# Patient Record
Sex: Male | Born: 1945 | Race: White | Hispanic: Yes | Marital: Married | Smoking: Former smoker
Health system: Southern US, Community
[De-identification: ages and names within clinical notes are randomized; demographics above are authoritative.]

## PROBLEM LIST (undated history)

## (undated) DIAGNOSIS — S069XAA Unspecified intracranial injury with loss of consciousness status unknown, initial encounter: Secondary | ICD-10-CM

## (undated) DIAGNOSIS — E871 Hypo-osmolality and hyponatremia: Secondary | ICD-10-CM

## (undated) DIAGNOSIS — D649 Anemia, unspecified: Secondary | ICD-10-CM

## (undated) DIAGNOSIS — I10 Essential (primary) hypertension: Secondary | ICD-10-CM

## (undated) DIAGNOSIS — R05 Cough: Secondary | ICD-10-CM

## (undated) DIAGNOSIS — K5730 Diverticulosis of large intestine without perforation or abscess without bleeding: Secondary | ICD-10-CM

## (undated) DIAGNOSIS — R194 Change in bowel habit: Secondary | ICD-10-CM

## (undated) DIAGNOSIS — F419 Anxiety disorder, unspecified: Secondary | ICD-10-CM

## (undated) DIAGNOSIS — E119 Type 2 diabetes mellitus without complications: Secondary | ICD-10-CM

## (undated) DIAGNOSIS — K819 Cholecystitis, unspecified: Secondary | ICD-10-CM

## (undated) DIAGNOSIS — K219 Gastro-esophageal reflux disease without esophagitis: Secondary | ICD-10-CM

## (undated) DIAGNOSIS — S069X9A Unspecified intracranial injury with loss of consciousness of unspecified duration, initial encounter: Secondary | ICD-10-CM

## (undated) HISTORY — DX: Gastro-esophageal reflux disease without esophagitis: K21.9

## (undated) HISTORY — DX: Diverticulosis of large intestine without perforation or abscess without bleeding: K57.30

## (undated) HISTORY — DX: Anemia, unspecified: D64.9

## (undated) HISTORY — PX: LAPAROTOMY: SHX154

## (undated) HISTORY — DX: Change in bowel habit: R19.4

## (undated) HISTORY — DX: Cough: R05

## (undated) HISTORY — DX: Cholecystitis, unspecified: K81.9

## (undated) HISTORY — PX: PARTIAL GASTRECTOMY: SHX2172

## (undated) HISTORY — PX: CHOLECYSTECTOMY: SHX55

## (undated) HISTORY — PX: BACK SURGERY: SHX140

---

## 1998-01-26 ENCOUNTER — Emergency Department (HOSPITAL_COMMUNITY): Admission: EM | Admit: 1998-01-26 | Discharge: 1998-01-26 | Payer: Self-pay | Admitting: Emergency Medicine

## 2001-01-03 ENCOUNTER — Inpatient Hospital Stay (HOSPITAL_COMMUNITY): Admission: EM | Admit: 2001-01-03 | Discharge: 2001-01-06 | Payer: Self-pay | Admitting: Emergency Medicine

## 2001-01-03 ENCOUNTER — Encounter (INDEPENDENT_AMBULATORY_CARE_PROVIDER_SITE_OTHER): Payer: Self-pay | Admitting: Specialist

## 2001-01-03 ENCOUNTER — Encounter: Payer: Self-pay | Admitting: Emergency Medicine

## 2001-01-24 ENCOUNTER — Ambulatory Visit (HOSPITAL_COMMUNITY): Admission: RE | Admit: 2001-01-24 | Discharge: 2001-01-24 | Payer: Self-pay

## 2001-01-27 ENCOUNTER — Ambulatory Visit (HOSPITAL_COMMUNITY): Admission: RE | Admit: 2001-01-27 | Discharge: 2001-01-27 | Payer: Self-pay | Admitting: Family Medicine

## 2001-03-24 ENCOUNTER — Ambulatory Visit (HOSPITAL_COMMUNITY): Admission: RE | Admit: 2001-03-24 | Discharge: 2001-03-24 | Payer: Self-pay | Admitting: Gastroenterology

## 2001-03-24 ENCOUNTER — Encounter (INDEPENDENT_AMBULATORY_CARE_PROVIDER_SITE_OTHER): Payer: Self-pay | Admitting: *Deleted

## 2001-06-26 ENCOUNTER — Ambulatory Visit (HOSPITAL_COMMUNITY): Admission: RE | Admit: 2001-06-26 | Discharge: 2001-06-26 | Payer: Self-pay | Admitting: Family Medicine

## 2001-10-01 ENCOUNTER — Inpatient Hospital Stay (HOSPITAL_COMMUNITY): Admission: EM | Admit: 2001-10-01 | Discharge: 2001-10-06 | Payer: Self-pay | Admitting: Emergency Medicine

## 2001-10-03 ENCOUNTER — Encounter (INDEPENDENT_AMBULATORY_CARE_PROVIDER_SITE_OTHER): Payer: Self-pay | Admitting: *Deleted

## 2001-10-06 ENCOUNTER — Encounter: Payer: Self-pay | Admitting: Internal Medicine

## 2001-11-12 ENCOUNTER — Emergency Department (HOSPITAL_COMMUNITY): Admission: EM | Admit: 2001-11-12 | Discharge: 2001-11-12 | Payer: Self-pay | Admitting: Emergency Medicine

## 2001-11-12 ENCOUNTER — Encounter: Payer: Self-pay | Admitting: Emergency Medicine

## 2002-02-22 ENCOUNTER — Emergency Department (HOSPITAL_COMMUNITY): Admission: EM | Admit: 2002-02-22 | Discharge: 2002-02-22 | Payer: Self-pay | Admitting: Emergency Medicine

## 2002-02-22 ENCOUNTER — Encounter: Payer: Self-pay | Admitting: Emergency Medicine

## 2002-06-18 ENCOUNTER — Ambulatory Visit (HOSPITAL_COMMUNITY): Admission: RE | Admit: 2002-06-18 | Discharge: 2002-06-20 | Payer: Self-pay | Admitting: Orthopedic Surgery

## 2002-06-18 ENCOUNTER — Encounter: Payer: Self-pay | Admitting: Orthopedic Surgery

## 2002-12-30 ENCOUNTER — Ambulatory Visit (HOSPITAL_BASED_OUTPATIENT_CLINIC_OR_DEPARTMENT_OTHER): Admission: RE | Admit: 2002-12-30 | Discharge: 2002-12-31 | Payer: Self-pay | Admitting: *Deleted

## 2003-01-12 ENCOUNTER — Encounter: Payer: Self-pay | Admitting: Specialist

## 2003-01-12 ENCOUNTER — Encounter: Admission: RE | Admit: 2003-01-12 | Discharge: 2003-01-12 | Payer: Self-pay | Admitting: Specialist

## 2003-06-09 ENCOUNTER — Ambulatory Visit (HOSPITAL_BASED_OUTPATIENT_CLINIC_OR_DEPARTMENT_OTHER): Admission: RE | Admit: 2003-06-09 | Discharge: 2003-06-09 | Payer: Self-pay | Admitting: *Deleted

## 2005-01-04 ENCOUNTER — Ambulatory Visit: Payer: Self-pay | Admitting: Nurse Practitioner

## 2005-01-05 ENCOUNTER — Ambulatory Visit (HOSPITAL_COMMUNITY): Admission: RE | Admit: 2005-01-05 | Discharge: 2005-01-05 | Payer: Self-pay | Admitting: Internal Medicine

## 2005-01-08 ENCOUNTER — Ambulatory Visit: Payer: Self-pay | Admitting: Nurse Practitioner

## 2005-01-08 ENCOUNTER — Ambulatory Visit: Payer: Self-pay | Admitting: *Deleted

## 2005-01-10 ENCOUNTER — Encounter (HOSPITAL_COMMUNITY): Admission: RE | Admit: 2005-01-10 | Discharge: 2005-04-10 | Payer: Self-pay | Admitting: Internal Medicine

## 2005-01-16 ENCOUNTER — Ambulatory Visit (HOSPITAL_COMMUNITY): Admission: RE | Admit: 2005-01-16 | Discharge: 2005-01-16 | Payer: Self-pay | Admitting: Internal Medicine

## 2005-01-31 ENCOUNTER — Ambulatory Visit (HOSPITAL_COMMUNITY): Admission: RE | Admit: 2005-01-31 | Discharge: 2005-01-31 | Payer: Self-pay | Admitting: Internal Medicine

## 2005-02-01 ENCOUNTER — Ambulatory Visit: Payer: Self-pay | Admitting: Nurse Practitioner

## 2005-02-08 ENCOUNTER — Ambulatory Visit: Payer: Self-pay | Admitting: Nurse Practitioner

## 2005-03-15 ENCOUNTER — Ambulatory Visit: Payer: Self-pay | Admitting: Nurse Practitioner

## 2005-04-09 ENCOUNTER — Ambulatory Visit: Payer: Self-pay | Admitting: Internal Medicine

## 2005-04-17 ENCOUNTER — Ambulatory Visit: Payer: Self-pay | Admitting: Nurse Practitioner

## 2005-04-24 ENCOUNTER — Ambulatory Visit: Payer: Self-pay | Admitting: Nurse Practitioner

## 2005-05-14 ENCOUNTER — Ambulatory Visit: Payer: Self-pay | Admitting: Pulmonary Disease

## 2005-05-17 ENCOUNTER — Ambulatory Visit: Payer: Self-pay | Admitting: Nurse Practitioner

## 2005-06-16 ENCOUNTER — Emergency Department (HOSPITAL_COMMUNITY): Admission: EM | Admit: 2005-06-16 | Discharge: 2005-06-16 | Payer: Self-pay | Admitting: Emergency Medicine

## 2005-06-18 ENCOUNTER — Ambulatory Visit: Payer: Self-pay | Admitting: Nurse Practitioner

## 2005-07-23 ENCOUNTER — Ambulatory Visit: Payer: Self-pay | Admitting: Internal Medicine

## 2005-08-10 ENCOUNTER — Ambulatory Visit: Payer: Self-pay | Admitting: Pulmonary Disease

## 2005-10-16 ENCOUNTER — Ambulatory Visit: Payer: Self-pay | Admitting: Nurse Practitioner

## 2005-11-05 ENCOUNTER — Ambulatory Visit: Payer: Self-pay | Admitting: Nurse Practitioner

## 2005-11-07 ENCOUNTER — Ambulatory Visit: Payer: Self-pay | Admitting: Pulmonary Disease

## 2005-11-19 ENCOUNTER — Ambulatory Visit: Payer: Self-pay | Admitting: Nurse Practitioner

## 2005-12-18 ENCOUNTER — Ambulatory Visit: Payer: Self-pay | Admitting: Internal Medicine

## 2006-01-22 ENCOUNTER — Ambulatory Visit: Payer: Self-pay | Admitting: Nurse Practitioner

## 2006-02-28 ENCOUNTER — Ambulatory Visit: Payer: Self-pay | Admitting: Family Medicine

## 2006-03-21 ENCOUNTER — Ambulatory Visit: Payer: Self-pay | Admitting: Nurse Practitioner

## 2006-03-22 ENCOUNTER — Ambulatory Visit (HOSPITAL_COMMUNITY): Admission: RE | Admit: 2006-03-22 | Discharge: 2006-03-22 | Payer: Self-pay | Admitting: Family Medicine

## 2007-03-13 ENCOUNTER — Ambulatory Visit: Payer: Self-pay | Admitting: Internal Medicine

## 2007-03-17 ENCOUNTER — Ambulatory Visit (HOSPITAL_COMMUNITY): Admission: RE | Admit: 2007-03-17 | Discharge: 2007-03-17 | Payer: Self-pay | Admitting: Nurse Practitioner

## 2007-04-09 ENCOUNTER — Ambulatory Visit: Payer: Self-pay | Admitting: Family Medicine

## 2007-05-07 ENCOUNTER — Encounter (INDEPENDENT_AMBULATORY_CARE_PROVIDER_SITE_OTHER): Payer: Self-pay | Admitting: *Deleted

## 2007-05-26 ENCOUNTER — Ambulatory Visit: Payer: Self-pay | Admitting: Internal Medicine

## 2007-05-30 ENCOUNTER — Encounter: Admission: RE | Admit: 2007-05-30 | Discharge: 2007-07-02 | Payer: Self-pay | Admitting: Internal Medicine

## 2007-11-22 ENCOUNTER — Emergency Department (HOSPITAL_COMMUNITY): Admission: EM | Admit: 2007-11-22 | Discharge: 2007-11-22 | Payer: Self-pay | Admitting: Emergency Medicine

## 2008-06-01 ENCOUNTER — Ambulatory Visit (HOSPITAL_COMMUNITY): Admission: RE | Admit: 2008-06-01 | Discharge: 2008-06-01 | Payer: Self-pay | Admitting: Internal Medicine

## 2008-06-01 ENCOUNTER — Ambulatory Visit: Payer: Self-pay | Admitting: Internal Medicine

## 2008-06-01 LAB — CONVERTED CEMR LAB
ALT: 12 units/L (ref 0–53)
AST: 20 units/L (ref 0–37)
Albumin: 4.2 g/dL (ref 3.5–5.2)
CO2: 23 meq/L (ref 19–32)
Calcium: 8.7 mg/dL (ref 8.4–10.5)
Chloride: 104 meq/L (ref 96–112)
Creatinine, Ser: 0.86 mg/dL (ref 0.40–1.50)
Eosinophils Relative: 3 % (ref 0–5)
HCT: 30.8 % — ABNORMAL LOW (ref 39.0–52.0)
Hemoglobin: 8.7 g/dL — ABNORMAL LOW (ref 13.0–17.0)
Lipase: 22 units/L (ref 0–75)
Lymphs Abs: 1.4 10*3/uL (ref 0.7–4.0)
MCV: 67.7 fL — ABNORMAL LOW (ref 78.0–100.0)
Potassium: 4.7 meq/L (ref 3.5–5.3)
RDW: 19 % — ABNORMAL HIGH (ref 11.5–15.5)
Total Bilirubin: 0.3 mg/dL (ref 0.3–1.2)
Total Protein: 7.7 g/dL (ref 6.0–8.3)

## 2008-07-12 ENCOUNTER — Ambulatory Visit: Payer: Self-pay | Admitting: Internal Medicine

## 2008-07-12 ENCOUNTER — Encounter (INDEPENDENT_AMBULATORY_CARE_PROVIDER_SITE_OTHER): Payer: Self-pay | Admitting: Internal Medicine

## 2008-07-12 LAB — CONVERTED CEMR LAB
Basophils Relative: 0 % (ref 0–1)
Lymphocytes Relative: 25 % (ref 12–46)
Lymphs Abs: 1.6 10*3/uL (ref 0.7–4.0)
MCHC: 29.6 g/dL — ABNORMAL LOW (ref 30.0–36.0)
MCV: 83.3 fL (ref 78.0–100.0)
Monocytes Absolute: 0.7 10*3/uL (ref 0.1–1.0)
RBC: 5.15 M/uL (ref 4.22–5.81)
WBC: 6.6 10*3/uL (ref 4.0–10.5)

## 2008-07-19 ENCOUNTER — Ambulatory Visit: Payer: Self-pay | Admitting: Internal Medicine

## 2008-12-17 ENCOUNTER — Ambulatory Visit: Payer: Self-pay | Admitting: Internal Medicine

## 2009-01-14 ENCOUNTER — Ambulatory Visit: Payer: Self-pay | Admitting: Internal Medicine

## 2009-02-16 ENCOUNTER — Ambulatory Visit: Payer: Self-pay | Admitting: Internal Medicine

## 2009-03-02 ENCOUNTER — Ambulatory Visit: Payer: Self-pay | Admitting: Family Medicine

## 2009-03-07 ENCOUNTER — Ambulatory Visit (HOSPITAL_COMMUNITY): Admission: EM | Admit: 2009-03-07 | Discharge: 2009-03-07 | Payer: Self-pay | Admitting: Emergency Medicine

## 2009-03-16 ENCOUNTER — Ambulatory Visit: Payer: Self-pay | Admitting: Internal Medicine

## 2009-05-10 ENCOUNTER — Ambulatory Visit: Payer: Self-pay | Admitting: Internal Medicine

## 2009-05-10 ENCOUNTER — Encounter (INDEPENDENT_AMBULATORY_CARE_PROVIDER_SITE_OTHER): Payer: Self-pay | Admitting: Internal Medicine

## 2009-05-10 LAB — CONVERTED CEMR LAB
Calcium: 8.9 mg/dL (ref 8.4–10.5)
Cholesterol: 185 mg/dL (ref 0–200)
HDL: 48 mg/dL (ref >39)
LDL Cholesterol: 120 mg/dL — ABNORMAL HIGH (ref 0–99)
Total CHOL/HDL Ratio: 3.9
Triglycerides: 84 mg/dL (ref <150)
VLDL: 17 mg/dL (ref 0–40)

## 2009-05-25 ENCOUNTER — Ambulatory Visit: Payer: Self-pay | Admitting: Internal Medicine

## 2009-06-01 ENCOUNTER — Encounter: Payer: Self-pay | Admitting: Family Medicine

## 2009-06-01 ENCOUNTER — Ambulatory Visit: Payer: Self-pay | Admitting: Internal Medicine

## 2009-06-01 LAB — CONVERTED CEMR LAB
Cholesterol: 184 mg/dL (ref 0–200)
HDL: 51 mg/dL (ref >39)
LDL Cholesterol: 117 mg/dL — ABNORMAL HIGH (ref 0–99)
VLDL: 16 mg/dL (ref 0–40)

## 2009-07-11 ENCOUNTER — Ambulatory Visit: Payer: Self-pay | Admitting: Internal Medicine

## 2009-08-10 ENCOUNTER — Ambulatory Visit: Payer: Self-pay | Admitting: Internal Medicine

## 2009-09-14 ENCOUNTER — Ambulatory Visit: Payer: Self-pay | Admitting: Internal Medicine

## 2009-09-19 ENCOUNTER — Ambulatory Visit: Payer: Self-pay | Admitting: Internal Medicine

## 2009-10-12 ENCOUNTER — Ambulatory Visit: Payer: Self-pay | Admitting: Internal Medicine

## 2009-10-12 LAB — CONVERTED CEMR LAB
BUN: 14 mg/dL (ref 6–23)
Calcium: 9.1 mg/dL (ref 8.4–10.5)
Eosinophils Absolute: 0.1 10*3/uL (ref 0.0–0.7)
Lymphocytes Relative: 15 % (ref 12–46)
MCV: 94.6 fL (ref 78.0–100.0)
Monocytes Absolute: 0.6 10*3/uL (ref 0.1–1.0)
Neutro Abs: 5.8 10*3/uL (ref 1.7–7.7)
Platelets: 192 10*3/uL (ref 150–400)
RDW: 13.8 % (ref 11.5–15.5)
Sodium: 140 meq/L (ref 135–145)

## 2009-10-17 ENCOUNTER — Ambulatory Visit: Payer: Self-pay | Admitting: Internal Medicine

## 2009-11-09 ENCOUNTER — Ambulatory Visit: Payer: Self-pay | Admitting: Internal Medicine

## 2009-12-05 ENCOUNTER — Ambulatory Visit: Payer: Self-pay | Admitting: Internal Medicine

## 2009-12-05 LAB — CONVERTED CEMR LAB
BUN: 17 mg/dL (ref 6–23)
Glucose, Bld: 96 mg/dL (ref 70–99)
Potassium: 4.8 meq/L (ref 3.5–5.3)
Sodium: 142 meq/L (ref 135–145)

## 2009-12-16 ENCOUNTER — Ambulatory Visit: Payer: Self-pay | Admitting: Internal Medicine

## 2010-01-01 ENCOUNTER — Emergency Department (HOSPITAL_COMMUNITY): Admission: EM | Admit: 2010-01-01 | Discharge: 2010-01-01 | Payer: Self-pay | Admitting: Emergency Medicine

## 2010-01-04 ENCOUNTER — Ambulatory Visit: Payer: Self-pay | Admitting: Internal Medicine

## 2010-01-04 LAB — CONVERTED CEMR LAB: Hgb A1c MFr Bld: 6.2 % — ABNORMAL HIGH (ref <5.7)

## 2010-01-09 ENCOUNTER — Ambulatory Visit: Payer: Self-pay | Admitting: Internal Medicine

## 2010-02-15 ENCOUNTER — Ambulatory Visit: Payer: Self-pay | Admitting: Internal Medicine

## 2010-02-15 LAB — CONVERTED CEMR LAB
ALT: 25 units/L (ref 0–53)
Alkaline Phosphatase: 50 units/L (ref 39–117)
Basophils Absolute: 0 10*3/uL (ref 0.0–0.1)
Basophils Relative: 0 % (ref 0–1)
CO2: 24 meq/L (ref 19–32)
Calcium: 9.2 mg/dL (ref 8.4–10.5)
Chloride: 102 meq/L (ref 96–112)
Cholesterol: 183 mg/dL (ref 0–200)
Eosinophils Absolute: 0.2 10*3/uL (ref 0.0–0.7)
Eosinophils Relative: 3 % (ref 0–5)
Glucose, Bld: 98 mg/dL (ref 70–99)
Iron: 105 ug/dL (ref 42–165)
Lymphocytes Relative: 26 % (ref 12–46)
Lymphs Abs: 1.3 10*3/uL (ref 0.7–4.0)
Monocytes Absolute: 0.6 10*3/uL (ref 0.1–1.0)
Monocytes Relative: 12 % (ref 3–12)
Neutro Abs: 3.1 10*3/uL (ref 1.7–7.7)
Neutrophils Relative %: 60 % (ref 43–77)
RBC: 4.14 M/uL — ABNORMAL LOW (ref 4.22–5.81)
Saturation Ratios: 37 % (ref 20–55)
Sodium: 137 meq/L (ref 135–145)
Total CK: 83 units/L (ref 7–232)
Total Protein: 7.2 g/dL (ref 6.0–8.3)
Triglycerides: 63 mg/dL (ref <150)
UIBC: 182 ug/dL

## 2010-03-08 ENCOUNTER — Ambulatory Visit: Payer: Self-pay | Admitting: Internal Medicine

## 2010-03-13 ENCOUNTER — Ambulatory Visit: Payer: Self-pay | Admitting: Internal Medicine

## 2010-03-16 ENCOUNTER — Encounter (INDEPENDENT_AMBULATORY_CARE_PROVIDER_SITE_OTHER): Payer: Self-pay | Admitting: *Deleted

## 2010-04-18 ENCOUNTER — Ambulatory Visit: Payer: Self-pay | Admitting: Gastroenterology

## 2010-04-18 ENCOUNTER — Encounter (INDEPENDENT_AMBULATORY_CARE_PROVIDER_SITE_OTHER): Payer: Self-pay | Admitting: *Deleted

## 2010-04-18 DIAGNOSIS — J438 Other emphysema: Secondary | ICD-10-CM | POA: Insufficient documentation

## 2010-04-18 DIAGNOSIS — K219 Gastro-esophageal reflux disease without esophagitis: Secondary | ICD-10-CM | POA: Insufficient documentation

## 2010-04-18 DIAGNOSIS — K902 Blind loop syndrome, not elsewhere classified: Secondary | ICD-10-CM | POA: Insufficient documentation

## 2010-04-18 DIAGNOSIS — D509 Iron deficiency anemia, unspecified: Secondary | ICD-10-CM | POA: Insufficient documentation

## 2010-04-18 DIAGNOSIS — I10 Essential (primary) hypertension: Secondary | ICD-10-CM | POA: Insufficient documentation

## 2010-04-18 DIAGNOSIS — R109 Unspecified abdominal pain: Secondary | ICD-10-CM | POA: Insufficient documentation

## 2010-04-18 LAB — CONVERTED CEMR LAB: Magnesium: 2.5 mg/dL (ref 1.5–2.5)

## 2010-04-19 ENCOUNTER — Encounter: Payer: Self-pay | Admitting: Gastroenterology

## 2010-05-03 ENCOUNTER — Telehealth: Payer: Self-pay | Admitting: Gastroenterology

## 2010-05-03 ENCOUNTER — Ambulatory Visit: Payer: Self-pay | Admitting: Gastroenterology

## 2010-05-05 ENCOUNTER — Encounter: Payer: Self-pay | Admitting: Gastroenterology

## 2010-05-10 ENCOUNTER — Telehealth: Payer: Self-pay | Admitting: Gastroenterology

## 2010-05-10 ENCOUNTER — Encounter: Payer: Self-pay | Admitting: Gastroenterology

## 2010-05-26 ENCOUNTER — Telehealth: Payer: Self-pay | Admitting: Gastroenterology

## 2010-06-26 ENCOUNTER — Emergency Department (HOSPITAL_COMMUNITY): Admission: EM | Admit: 2010-06-26 | Discharge: 2010-06-26 | Payer: Self-pay | Admitting: Family Medicine

## 2010-07-19 ENCOUNTER — Inpatient Hospital Stay (HOSPITAL_COMMUNITY)
Admission: RE | Admit: 2010-07-19 | Discharge: 2010-07-26 | Payer: Self-pay | Source: Home / Self Care | Attending: Surgery | Admitting: Surgery

## 2010-07-28 ENCOUNTER — Ambulatory Visit (HOSPITAL_COMMUNITY)
Admission: RE | Admit: 2010-07-28 | Discharge: 2010-07-28 | Payer: Self-pay | Source: Home / Self Care | Attending: Surgery | Admitting: Surgery

## 2010-08-18 ENCOUNTER — Encounter: Payer: Self-pay | Admitting: Gastroenterology

## 2010-08-18 ENCOUNTER — Encounter (INDEPENDENT_AMBULATORY_CARE_PROVIDER_SITE_OTHER): Payer: Self-pay | Admitting: *Deleted

## 2010-08-18 LAB — CONVERTED CEMR LAB: PSA: 4.27 ng/mL — ABNORMAL HIGH (ref <=4.00)

## 2010-08-19 ENCOUNTER — Encounter (INDEPENDENT_AMBULATORY_CARE_PROVIDER_SITE_OTHER): Payer: Self-pay | Admitting: *Deleted

## 2010-09-10 ENCOUNTER — Encounter: Payer: Self-pay | Admitting: Internal Medicine

## 2010-09-19 NOTE — Progress Notes (Signed)
Summary: phone   Phone Note Outgoing Call   Call placed by: Karl Bales RN,  May 03, 2010 8:43 AM Summary of Call: Phoned pt to make check if he is going to bring an interpretor to his procedure today.  Spoke with daughter in law, who states pt is not bringing interpretor to appt.  Told family member that one would be provided and understanding voiced Initial call taken by: Karl Bales RN,  May 03, 2010 8:45 AM     Appended Document: phone Left message with Doristine Section for interpretor needed for this pt today at 12:30

## 2010-09-19 NOTE — Letter (Signed)
Summary: [facility]   Imported By: Lennie Odor 05/18/2010 10:14:34  _____________________________________________________________________  External Attachment:    Type:   Image     Comment:   External Document

## 2010-09-19 NOTE — Letter (Signed)
Summary: New Patient letter  [facility]  [address] [place], [place] [address]   Phone: [phone]  Fax: [phone]       03/16/2010 MRN: [id number]  [facility] [name] [address] [place], [place]  [address]  Dear [name],  Welcome to the Gastroenterology Division at [facility].    You are scheduled to see Dr. Jarold Motto on 04/18/2010 at 1:30PM on the 3rd floor at [facility], [address]  We ask that you try to arrive at our office 15 minutes prior to your appointment time to allow for check-in.  We would like you to complete the enclosed self-administered evaluation form prior to your visit and bring it with you on the day of your appointment.  We will review it with you.  Also, please bring a complete list of all your medications or, if you prefer, bring the medication bottles and we will list them.  Please bring your insurance card so that we may make a copy of it.  If your insurance requires a referral to see a specialist, please bring your referral form from your primary care physician.  Co-payments are due at the time of your visit and may be paid by cash, check or credit card.     Your office visit will consist of a consult with your physician (includes a physical exam), any laboratory testing he/she may order, scheduling of any necessary diagnostic testing (e.g. x-ray, ultrasound, CT-scan), and scheduling of a procedure (e.g. Endoscopy, Colonoscopy) if required.  Please allow enough time on your schedule to allow for any/all of these possibilities.    If you cannot keep your appointment, please call [phone] to cancel or reschedule prior to your appointment date.  This allows [place] the opportunity to schedule an appointment for another patient in need of care.  If you do not cancel or reschedule by 5 p.m. the business day prior to your appointment date, you will be charged a $50.00 late cancellation/no-show fee.    Thank you for  choosing [facility] for your medical needs.  We appreciate the opportunity to care for you.  Please visit [place] at our website  to learn more about our practice.                     Sincerely,                                                             The Gastroenterology Division

## 2010-09-19 NOTE — Progress Notes (Signed)
Summary: Prior auth.   Phone Note From Pharmacy   [facility]er: [facility] [phone] Call For: Dr. Jarold Motto  Summary of Call: Ins. needs prior auth. for NUCYNTA Initial call taken by: Karna Christmas,  May 10, 2010 2:41 PM  Follow-up for Phone Call        i have called about the prior auth at [phone]. they will fax me a form and I have advised the pharm i am working on it and will call them when i hear back from his insurance.  Follow-up by: Harlow Mares CMA Duncan Dull),  May 10, 2010 2:53 PM  Additional Follow-up for Phone Call Additional follow up Details #1::        left message for the patient that his insurance will not pay for the medication and if he would like to take it has has to pay out of pocket for it. I advised him to call me back if he has any questions. I also advised the pharm Additional Follow-up by: Harlow Mares CMA Duncan Dull),  May 11, 2010 8:34 AM

## 2010-09-19 NOTE — Procedures (Signed)
Summary: Colon   Colonoscopy  Procedure date:  03/24/2001  Findings:      Location:  [facility].                          [facility]  Patient:    [name], [name]                         MRN: [id number] Proc. Date: 03/24/01 Attending:  Verlin Grills, M.D. CC:         Moshe Salisbury. Audria Nine, M.D., [facility]  Anastasio Auerbach, M.D.   Procedure Report  PROCEDURE:  Colonoscopy.  PROCEDURE INDICATION:  [name] (date of birth 06-12-2046) is a 65 year old male. [name] was hospitalized at [facility] from Jan 01, 2001 to Jan 06, 2001, by Dr. Anastasio Auerbach.  The patient was admitted with microcytic anemia and Hemoccult positive stool.  His Jan 06, 2001, esophagogastroduodenoscopy was normal, status post antrectomy with Billroth II anastomosis.  His Jan 06, 2001, flexible proctosigmoidoscopy performed to 50 cm was normal except for the removal of a 3 mm hyperplastic polyp noted at 25 cm from the anal verge.  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  [name] is referred for diagnostic colonoscopy.  MEDICATION ALLERGIES:  None.  PAST MEDICAL HISTORY:  Peptic ulcer disease surgery 10-12 years ago. Small-bowel obstruction surgery approximately six years ago.  HABITS:  [name] smokes less than 1 pack of cigarettes per day.  He does not consume alcohol.  ENDOSCOPIST:  Verlin Grills, M.D.  PREMEDICATION:  Versed 10 mg, Demerol 50 mg.  ENDOSCOPE:  Olympus pediatric colonoscope.  DESCRIPTION OF PROCEDURE:  After obtaining informed consent, [name] was placed in the left lateral decubitus position.  I administered intravenous Demerol and intravenous Versed to achieve conscious sedation for the procedure.  The patients blood pressure, oxygen saturations, and cardiac rhythm were monitored throughout the procedure and documented in the  medical record.  Anal inspection was normal.  Digital rectal exam was normal.  The Olympus pediatric video colonoscope was introduced into the rectum and with some difficulty, it advanced to the cecum, as identified by a normal-appearing ileocecal valve and transillumination of the endoscopic light in the right lower quadrant.  Colonic preparation for the exam today was excellent.  RECTUM:  Normal.  SIGMOID COLON AND DESCENDING COLON:  Normal.  SPLENIC FLEXURE:  Normal.  TRANSVERSE COLON:  Normal.  HEPATIC FLEXURE:  Normal.  ASCENDING COLON:  Normal.  CECUM AND ILEOCECAL VALVE:  Normal.  ASSESSMENT:  Normal proctocolonoscopy to the cecum.  No endoscopic evidence for the presence of colorectal neoplasia.  Close examination of the right colon, hepatic flexure revealed no abnormality, as suspected by air contrast barium enema. DD:  03/24/01 TD:  03/24/01 Job: [id number]

## 2010-09-19 NOTE — Assessment & Plan Note (Signed)
Summary: ABD PAIN...AS.    History of Present Illness Primary GI MD: Sheryn Bison MD FACP FAGA Primary Provider: Dayna Barker, MD Chief Complaint: Patient c/o 2 years continuous reflux as well as generalized abdominal pain. Pain can be positional. He also c/o dysphagia to solids. There is daily nausea and vomiting as well as intermittent diarrhea.  History of Present Illness:   Extremely Complex 65 year old [name] Referred by [facility] for evaluation of diffuse abdominal pain, gas, bloating, diarrhea, and malabsorption of iron. Entire conversation is in Spanish with an interpreter.  This patient was previously evaluated by [facility] and Dr. Danise Edge in 2002 2003. It is unclear to me if he has had any further followup with this group. In any case, he is referred to  [facility] after having a negative CT scan of the abdomen in 2007, and numerous laboratory parameters which have all been normal. Actually review of his iron studies does not show iron deficiency, but he apparently is on an iron supplement.  He apparently had a partial gastrectomy with Billroth II gastroenterostomy many years ago in [place]. Since that time he said gas, bloating, abdominal cramping, and diarrhea. He was recently placed on a Prevpac which caused him extreme nausea and vomiting. Review of his record shows a negative H. pylori antibody. He does complain of chronic regurgitation and burning in his substernal area unrelieved by omeprazole 20 mg twice a day. He denies associated dysphagia, anorexia, weight loss, melena or hematochezia.  He apparently has had several CT scans including a chest CT scan in May which showed mild emphysema but otherwise was normal. Recent hemoglobin was 12.7 and B12, folate, and iron levels were normal. He denies ongoing ethanol abuse. There is a vague history of partial previous small bowel obstruction. Apparently has a past history of kidney stones. His abdominal pain  is non-specific, diffuse, associated with gas and bloating. He cannot locate pain in any specific quadrant of the abdomen. What I can see, he is not on chronic pain therapy. There is no known history of hepatitis or pancreatitis. The status of his previous ethanol intake is unclear, but he does have emphysema and was a chronic heavy smoker until 2006.   GI Review of Systems    Reports abdominal pain, acid reflux, bloating, chest pain, dysphagia with solids, loss of appetite, nausea, and  vomiting.     Location of  Abdominal pain: generalized.    Denies belching, dysphagia with liquids, heartburn, vomiting blood, weight loss, and  weight gain.      Reports change in bowel habits and  diarrhea.     Denies anal fissure, black tarry stools, constipation, diverticulosis, fecal incontinence, heme positive stool, hemorrhoids, irritable bowel syndrome, jaundice, light color stool, liver problems, rectal bleeding, and  rectal pain. Preventive Screening-Counseling & Management  Alcohol-Tobacco     Smoking Status: quit  Caffeine-Diet-Exercise     Does Patient Exercise: yes      Drug Use:  no.      Current Medications (verified): 1)  Nuiron 150mg  .... One By Mouth Bid 2)  Omeprazole 20 Mg Cpdr (Omeprazole) .... Take 1 Tablet By Mouth Two Times A Day 3)  Triamterene-Hctz 37.5-25 Mg Tabs (Triamterene-Hctz) .... Take 1 Tablet By Mouth Once A Day 4)  Tamusolin 0.4 Mg .... Take 1 Tablet By Mouth Once Daily 5)  Lisinopril 20 Mg Tabs (Lisinopril) .... Take 1 Tablet By Mouth Once Daily 6)  Multivitamins  Tabs (Multiple Vitamin) .... Take 1 Tablet  By Mouth Once A Day  Allergies (verified): No Known Drug Allergies  Past History:  Past medical, surgical, family and social histories (including risk factors) reviewed for relevance to current acute and chronic problems.  Past Medical History: anemia ulcers COPD GERD Hypertension Anxiety Arthritis Chronic Headaches Depression Hx of intestinal  blockage Hx kidney stones  Past Surgical History: partial gastrectomy ([place]) intestinal obstructions-segment removed (patient says they "removed almost all of the small intestine")-[place]  Family History: Reviewed history and no changes required. No FH of Colon Cancer: Family History of Liver Cancer: Father Family History of Prostate Cancer: Grandfather (paternal) Family History of Uterine/Cervical Cancer: Mother and multiple other family members   Social History: Reviewed history and no changes required. Patient is a former smoker. -stopped 5 years ago Alcohol Use - no Illicit Drug Use - no Patient gets regular exercise. Smoking Status:  quit Drug Use:  no Does Patient Exercise:  yes  Review of Systems       The patient complains of allergy/sinus, arthritis/joint pain, back pain, cough, fever, headaches-new, muscle pains/cramps, nosebleeds, shortness of breath, sore throat, and urination - excessive.  The patient denies anemia, anxiety-new, blood in urine, breast changes/lumps, change in vision, confusion, coughing up blood, depression-new, fainting, fatigue, hearing problems, heart murmur, heart rhythm changes, itching, menstrual pain, night sweats, pregnancy symptoms, skin rash, sleeping problems, swelling of feet/legs, swollen lymph glands, thirst - excessive , urination - excessive , urination changes/pain, urine leakage, vision changes, and voice change.    Vital Signs:  Patient profile:   65 year old [name] Height:      65 inches Weight:      143 pounds BMI:     23.88 BSA:     1.72 Pulse rate:   72 / minute Pulse rhythm:   regular BP sitting:   116 / 64  (left arm) Cuff size:   regular  Vitals Entered By: Ok Anis CMA (April 18, 2010 2:37 PM)  Physical Exam  General:  Well developed, well nourished, no acute distress.healthy appearing.   Head:  Normocephalic and atraumatic. Eyes:  PERRLA, no icterus. Neck:  Supple; no masses or thyromegaly. Lungs:  Clear  throughout to auscultation. Heart:  Regular rate and rhythm; no murmurs, rubs,  or bruits. Abdomen:  Large but well-healed right upper quadrant scar. No definite organomegaly, masses or tenderness. Bowel sounds are nonobstructed. There is no evidence of abdominal ascites. Rectal:  deferred until time of colonoscopy.   Msk:  Symmetrical with no gross deformities. Normal posture. Extremities:  No clubbing, cyanosis, edema or deformities noted. Neurologic:  Alert and  oriented x4;  grossly normal neurologically. Cervical Nodes:  No significant cervical adenopathy. Axillary Nodes:  No significant axillary adenopathy. Psych:  Alert and cooperative. Normal mood and affect.   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 Henriette Combs previous endoscopy report shows a partial gastrectomy, probable vagotomy, and Billroth II gastroenterostomy. This surgical procedure is associated with recurrent bacterial overgrowth syndrome, also chronic malabsorption of iron, calcium, and other vitamins. Actually, this patient is not losing weight at this time. There is no evidence on examination of small bowel obstruction, and he has had previous negative CT scan of the abdomen. I have decided to treat him with Xifaxan 250 mg twice a day for 10 days with  probiotic therapy. His physicians have requested endoscopy and colonoscopy which we will repeat to exclude other abnormalities. A few repeat labs have also been ordered. TLB-Magnesium (Mg) (83735-MG) TLB-IgA (Immunoglobulin A) (82784-IGA) T-Sprue Panel (Celiac Disease Aby Eval) (83516x3/[address]) T-Stool Fats [place] Stain [phone]) T- * Misc. Laboratory test [phone])  Problem # 2:  GERD (ICD-530.81) Assessment: Deteriorated Probable bile-  reflux, associated with his gastric surgery. I have added Carafate suspension 10 cc at bedtime and 5 cc after meals while continuing omeprazole. Occasionally, alkaline reflux gastritis and esophagitis can be so severe that it requires Roux-en-Y biliary diversion. It is not uncommon for people to be postoperative intestinal cripples after the above-mentioned surgery. Of course, some of his symptoms could be related to his vagotomy and GI motility issues. I have ordered fecal elastase-1 exam to exclude an element of pancreatic mixing problems with his digestion associated with his surgical anatomy. It is unlikely in similar patients that all of his symptoms will be"cured", in most of these patients have chronic recurrent refractory GI symptoms.My thoughts, diagnosis, and recommendations were given to the patient through an excellent interpreter provided for this exam and interview.  Problem # 3:  ESSENTIAL HYPERTENSION (ICD-401.9) Assessment: Improved Continue antihypertensive medication per  primary care.Blood Pressure normal at 116/64.  Problem # 4:  ANEMIA-IRON DEFICIENCY (ICD-280.9) Assessment: Improved Continued use of oral iron supplements.  Problem # 5:  EMPHYSEMA (ICD-492.8) Assessment: Unchanged He Appears well compensated from a pulmonary standpoint. He has had previous evaluation by Dr. Danice Goltz in 2006 and was placed on Advair inhalers. Patient apparently has a long history of cigarette smoking with discontinuation 2006. Her workup also mentions chronic anxiety and depression related to family difficulties.  Patient Instructions: 1)  Please go to the basement for lab work. 2)  Multimedia programmer daily.  Samples given. 3)  Begin Xifaxin two times a day, Pick up rx at your pharmacy. 4)  You are scheduled for an upper endoscopy and colonoscopy. 5)  The medication list was reviewed and reconciled.  All changed / newly prescribed medications were explained.  A complete medication list was  provided to the patient / caregiver. 6)  Carafate suspension 10 cc at bedtime and 5 cc after meals  Appended Document: ABD PAIN...AS.    Clinical Lists Changes  Medications: Added new medication of XIFAXAN 550 MG TABS (RIFAXIMIN) 1 by mouth two times a day - Signed Added new medication of CARAFATE 1 GM/10ML  SUSP (SUCRALFATE) 10 ml at bedtime and 5 ml after meals - Signed Added new medication of ALIGN  CAPS (PROBIOTIC PRODUCT) daily Rx of XIFAXAN 550 MG TABS (RIFAXIMIN) 1 by mouth two times a day;  #20 x 0;  Signed;  Entered by: Ashok Cordia RN;  Authorized by: Mardella Layman MD [facility];  [facility] [address] [phone]*, [address] [place], [place], [place]  [address], Ph: [phone] or [phone], Fax: [phone] Rx of CARAFATE 1 GM/10ML  SUSP (SUCRALFATE) 10 ml at bedtime and 5 ml after meals;  #14 cc x 1;  Signed;  Entered by: Ashok Cordia RN;  Authorized by: Mardella Layman MD [facility];  Method used: [facility]  [address] [phone]*, [address], [place], [place], [place]  [id number], Ph: [phone] or [phone], Fax: [phone] Orders: Added new Test order of Colon/Endo (Colon/Endo) - Signed    Prescriptions: CARAFATE 1 GM/10ML  SUSP (SUCRALFATE) 10 ml at bedtime and 5 ml after meals  #  14 cc x 1   Entered by:   Ashok Cordia RN   Authorized by:   Mardella Layman MD [facility]   Signed by:   Ashok Cordia RN on 04/18/2010   Method used:   [facility]        [facility]  [address] [phone]* (retail)       [address]       [place], [place]  [address]       Ph: [phone] or [phone]       Fax: [phone]   RxID:   [id number] XIFAXAN 550 MG TABS (RIFAXIMIN) 1 by mouth two times a day  #20 x 0   Entered by:   Ashok Cordia RN   Authorized by:   Mardella Layman MD [facility]   Signed by:   Ashok Cordia RN on 04/18/2010   Method used:   [facility]         [facility]  [address] [phone]* (retail)       [address]       [place], [place]  [address]       Ph: [phone] or [phone]       Fax: [phone]   RxID:   [id number]

## 2010-09-19 NOTE — Miscellaneous (Signed)
Summary: Orders Update  Clinical Lists Changes  Orders: Added new Test order of [facility] ([facility]) - Signed

## 2010-09-19 NOTE — Letter (Signed)
Summary: [facility] Instructions  [facility]  [address] [place], [place] [address]   Phone: [phone]  Fax: [phone]       [facility] CID PRADO    1946-05-31    MRN: [id number]        Procedure Day /Date: 05/03/10      Arrival Time: 12:30      Procedure Time: 1:30     Location of Procedure:                    Juliann Pares _  [facility] (4th Floor)                        PREPARATION FOR COLONOSCOPY WITH MOVIPREP   Starting 5 days prior to your procedure 04/28/10 do not eat nuts, seeds, popcorn, corn, beans, peas,  salads, or any raw vegetables.  Do not take any fiber supplements (e.g. Metamucil, Citrucel, and Benefiber).  THE DAY BEFORE YOUR PROCEDURE         DATE: 05/02/10    DAY: Tuesday  1.  Drink clear liquids the entire day-NO SOLID FOOD  2.  Do not drink anything colored red or purple.  Avoid juices with pulp.  No orange juice.  3.  Drink at least 64 oz. (8 glasses) of fluid/clear liquids during the day to prevent dehydration and help the prep work efficiently.  CLEAR LIQUIDS INCLUDE: Water Jello Ice Popsicles Tea (sugar ok, no milk/cream) Powdered fruit flavored drinks Coffee (sugar ok, no milk/cream) Gatorade Juice: apple, white grape, white cranberry  Lemonade Clear bullion, consomm, broth Carbonated beverages (any kind) Strained chicken noodle soup Hard Candy                             4.  In the morning, mix first dose of MoviPrep solution:    Empty 1 Pouch A and 1 Pouch B into the disposable container    Add lukewarm drinking water to the top line of the container. Mix to dissolve    Refrigerate (mixed solution should be used within 24 hrs)  5.  Begin drinking the prep at 5:00 p.m. The MoviPrep container is divided by 4 marks.   Every 15 minutes drink the solution down to the next mark (approximately 8 oz) until the full liter is complete.   6.  Follow completed prep with 16 oz of clear liquid of your choice (Nothing red  or purple).  Continue to drink clear liquids until bedtime.  7.  Before going to bed, mix second dose of MoviPrep solution:    Empty 1 Pouch A and 1 Pouch B into the disposable container    Add lukewarm drinking water to the top line of the container. Mix to dissolve    Refrigerate  THE DAY OF YOUR PROCEDURE      DATE: 05/03/10    DAY: Wednesday  Beginning at 8:30 a.m. (5 hours before procedure):         1. Every 15 minutes, drink the solution down to the next mark (approx 8 oz) until the full liter is complete.  2. Follow completed prep with 16 oz. of clear liquid of your choice.    3. You may drink clear liquids until 11:30  (2 HOURS BEFORE PROCEDURE).   MEDICATION INSTRUCTIONS  Unless otherwise instructed, you should take regular prescription medications with a small sip of water   as early  as possible the morning of your procedure.                OTHER INSTRUCTIONS  You will need a responsible adult at least 67 years of age to accompany you and drive you home.   This person must remain in the waiting room during your procedure.  Wear loose fitting clothing that is easily removed.  Leave jewelry and other valuables at home.  However, you may wish to bring a book to read or  an iPod/MP3 player to listen to music as you wait for your procedure to start.  Remove all body piercing jewelry and leave at home.  Total time from sign-in until discharge is approximately 2-3 hours.  You should go home directly after your procedure and rest.  You can resume normal activities the  day after your procedure.  The day of your procedure you should not:   Drive   Make legal decisions   Operate machinery   Drink alcohol   Return to work  You will receive specific instructions about eating, activities and medications before you leave.    The above instructions have been reviewed and explained to me by   _______________________    I fully understand and can  verbalize these instructions _____________________________ Date _________

## 2010-09-19 NOTE — Progress Notes (Signed)
Summary: Would like something cheaper called in   Phone Note Call from Patient Call back at Home Phone [phone]   Caller: [name] Call For: Dr Jarold Motto Reason for Call: Talk to Nurse Summary of Call: Would like something cheaper because he is having alot of pain. Initial call taken by: Leanor Kail [facility],  May 26, 2010 12:00 PM  Follow-up for Phone Call        Left a message on patients machine to call back. Harlow Mares CMA Duncan Dull)  May 26, 2010 1:33 PM  pts son called back and I returned his call but had to leave another message.   Left a message on patients machine to call back. Harlow Mares CMA Duncan Dull)  May 26, 2010 2:01 PM    Additional Follow-up for Phone Call Additional follow up Details #1::        per patients daughter in law the patient is having alot of nausea in the morning. but she states that his pain is doing better, but her english is very broken and she will have her husband call me back when he wakes at 4pm.  Additional Follow-up by: Harlow Mares CMA Duncan Dull),  May 30, 2010 10:42 AM    Additional Follow-up for Phone Call Additional follow up Details #2::    LETS TALK TO THE SON-SEE IF HE IS MAINLY NAUSEATED, OR IF PAIN IS THE ISSUE-HE IS SUPPOSED TO BE SCHEDULED FOR SURGERY WITH DR.MARTIN-FIND OUT FROM FAMILY WHEN THIS IS....  CAN LET HIM TRY ULTRAM 50 MG EVERY  6 HOURS FOR PAIN #40/NO REFILLS  Amy Oswald Hillock PA-c,  May 30, 2010 12:55 PM  Called [facility] and patients surgery is scheduled for 07/19/2010. Harlow Mares CMA Duncan Dull)  May 30, 2010 1:45 PM   Additional Follow-up for Phone Call Additional follow up Details #3:: Details for Additional Follow-up Action Taken: per granddaughter translation from the patient he is not having nausea and is just having abdominal pain and gas. I have sent in the new pain medication and advised her to tell him to take gas x if he is having gas. And they can call Dr. Norva Riffle office to see if they can  help since he is scheduled for surgery on 07/19/2010. She states that she told her grandfather and they will pick up the new medication.  Additional Follow-up by: Harlow Mares CMA (AAMA),  May 30, 2010 4:53 PM  New/Updated Medications: ULTRAM 50 MG TABS (TRAMADOL HCL) take one by mouth every 6 hours Prescriptions: ULTRAM 50 MG TABS (TRAMADOL HCL) take one by mouth every 6 hours  #40 x 0   Entered by:   Harlow Mares CMA (AAMA)   Authorized by:   Mardella Layman MD [facility]   Signed by:   Harlow Mares CMA (AAMA) on 05/30/2010   Method used:   Electronically to        [facility]  [address] [phone]* (retail)       [address]       [place], [place]  [id number]       Ph: [phone] or [phone]       Fax: [phone]   RxID:   [id number]

## 2010-09-19 NOTE — Procedures (Signed)
Summary: EGD, Flex Sig   EGD  Procedure date:  10/03/2001  Findings:      Location: [facility]                     [place]  Patient:    [name], [name] Visit Number: [id number] MRN: [id number]          Service Type: MED Location: [facility] 2902 01 Attending Physician:  Phifer, [name] Dictated by:   [name], M.D. Proc. Date: 10/03/01 Admit Date:  10/01/2001 Discharge Date: 10/06/2001   CC:         [name], M.D.   Operative Report  PROCEDURE:  Esophagogastroduodenoscopy and flexible proctosigmoidoscopy.  PROCEDURE INDICATION:  [name] is a 65 year old male born 06/20/1946.  [name] was admitted to the hospital with iron deficiency anemia and Hemoccult-positive stool.  [name] was admitted to [facility] from May 15-20, 2002, by Dr. Anastasio [name] to evaluate iron deficiency anemia and Hemoccult-positive stool.  His Jan 06, 2001, esophagogastroduodenoscopy was normal, [name] Billroth II antrectomy. His Jan 06, 2001, flexible proctosigmoidoscopy performed to 50 cm was normal; a 3 mm hyperplastic polyp was removed from his distal sigmoid colon at 25 cm from the anal verge.  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  [name] March 24, 2001, proctocolonoscopy to the cecum was normal.  ENDOSCOPIST:  [name], M.D.  PREMEDICATION:  Versed 12 mg, Demerol 50 mg.  ENDOSCOPE:  Olympus pediatric colonoscope.  PROCEDURE:  Esophagogastroduodenoscopy with pediatric colonoscope.  DESCRIPTION OF PROCEDURE:  [name] was placed in the left lateral decubitus position.  I administered intravenous Demerol and intravenous Versed to achieve conscious sedation for the procedure.  The patients blood pressure, oxygen saturation, and cardiac rhythm were monitored throughout the procedure and documented in the medical record.  The Olympus  pediatric colonoscope was passed through the posterior hypopharynx into the proximal esophagus without difficulty.  The hypopharynx, larynx, and vocal cords appeared normal.  Esophagoscopy:  The proximal, mid-, and lower segments of the esophagus appear normal.  Gastroscopy:  [name] has undergone an antrectomy with Billroth II anastomosis.  Retroflex view of the gastric cardia and fundus was normal.  The gastric pouch appeared normal.  There is active bleeding from the Billroth II anastomosis, and a visible protuberance was endoclipped with apparent hemostasis.  Enteroscopy:  The efferent and afferent loops of the Billroth II anastomosis are normal.  PROCEDURE:  Flexible proctosigmoidoscopy to 50 cm.  DESCRIPTION OF PROCEDURE:  Anal inspection was normal.  Digital rectal exam was normal.  The Olympus pediatric video colonoscope was introduced into the rectum, revealing melanic-appearing stool coating the rectum.  The endoscope was advanced to approximately 50 cm from the anal verge, revealing melanic-appearing stool coating the colon, making visibility quite poor.  I was unable to complete a colonoscopy secondary to poor colonic prep and due to colonic loop formation.  ASSESSMENT: 1. Upper gastrointestinal bleeding from the Billroth II surgical anastomosis,    question Dieulafoy lesion, endoclipped. 2. I am unable to assess the colon secondary to poor colonic prep and colonic    loop formation.  His January 27, 2001, air contrast barium enema revealed a    possible tumor in the hepatic flexure.  His March 24, 2001,    proctocolonoscopy to the cecum was normal.  RECOMMENDATIONS:  I will schedule [name] for a repeat air contrast barium enema to rule out the hepatic flexure tumor visualized by the January 27, 2001, air contrast barium enema. Dictated by:   [name], M.D. Attending Physician:  Phifer, [name] DD:  10/03/01 TD:  10/04/01 Job: [id number]

## 2010-09-19 NOTE — Medication Information (Signed)
Summary: Prior auth & DENIED for [place]  Prior auth & DENIED for [place] [employer]   Imported By: Sherian Rein 05/15/2010 08:07:18  _____________________________________________________________________  External Attachment:    Type:   Image     Comment:   External Document

## 2010-09-19 NOTE — Miscellaneous (Signed)
Summary: [facility] RX  Clinical Lists Changes  Medications: Added new medication of NUCYNTA 100 MG TABS (TAPENTADOL HCL) Take 1 tablet by mouth two times a day as needed for pain - Signed Rx of NUCYNTA 100 MG TABS (TAPENTADOL HCL) Take 1 tablet by mouth two times a day as needed for pain;  #100 x 0;  Signed;  Entered by: Francee Piccolo CMA (AAMA);  Authorized by: Mardella Layman MD [facility];  Method used: Print then Give to Patient    Prescriptions: NUCYNTA 100 MG TABS (TAPENTADOL HCL) Take 1 tablet by mouth two times a day as needed for pain  #100 x 0   Entered by:   Francee Piccolo CMA (AAMA)   Authorized by:   Mardella Layman MD [facility]   Signed by:   Francee Piccolo CMA (AAMA) on 05/03/2010   Method used:   Print then Give to Patient   RxID:   [id number]

## 2010-09-19 NOTE — Procedures (Signed)
Summary: Colonoscopy  Patient: [name] Note: All result statuses are Final unless otherwise noted.  Tests: (1) Colonoscopy (COL)   COL Colonoscopy           DONE     [facility]     [address]     [place], [place]  [address]           COLONOSCOPY PROCEDURE REPORT           PATIENT:  [name], [name]  MR#:  [id number]     BIRTHDATE:  1945-12-19, 63 yrs. old  GENDER:  male     ENDOSCOPIST:  Vania Rea. Jarold Motto, MD, [facility]     REF. BY:  Julieanne Manson, M.D.     PROCEDURE DATE:  05/03/2010     PROCEDURE:  Surveillance Colonoscopy     ASA CLASS:  Class II     INDICATIONS:  Abdominal pain, Abnormal weight loss, Colorectal     cancer screening, average risk     MEDICATIONS:   Fentanyl 100 mcg IV, Versed 10 mg IV           DESCRIPTION OF PROCEDURE:   After the risks benefits and     alternatives of the procedure were thoroughly explained, informed     consent was obtained.  Digital rectal exam was performed and     revealed no abnormalities.   The [id number] endoscope was     introduced through the anus and advanced to the cecum, which was     identified by both the appendix and ileocecal valve, adhesions     fixing the right colon area,,,difficult exam.  The quality of the     prep was excellent, using MoviPrep.  The instrument was then     slowly withdrawn as the colon was fully examined.     <<PROCEDUREIMAGES>>           FINDINGS:  Scattered diverticula were found in the sigmoid to     descending colon segments.  No polyps or cancers were seen.  This     was otherwise a normal examination of the colon.   Retroflexed     views in the rectum revealed no abnormalities.    The scope was     then withdrawn from the patient and the procedure completed.           COMPLICATIONS:  None     ENDOSCOPIC IMPRESSION:     1) Diverticula, scattered in the sigmoid to descending colon     segments     2) No polyps or cancers     3) Otherwise normal examination   RECOMMENDATIONS:     1) Continue current colorectal screening recommendations for     "routine risk" patients with a repeat colonoscopy in 10 years.     2) Upper endoscopy will be scheduled     REPEAT EXAM:  No           ______________________________     Vania Rea. Jarold Motto, MD, [facility]           CC:           n.     eSIGNED:   Vania Rea. Patterson at 05/03/2010 02:08 PM           Gwenyth Bouillon, [phone]  Note: An exclamation mark (!) indicates a result that was not dispersed into the flowsheet. Document Creation Date: 05/03/2010 2:08 PM _______________________________________________________________________  (1) Order result status: Final  Collection or observation date-time: 05/03/2010 14:02 Requested date-time:  Receipt date-time:  Reported date-time:  Referring Physician:   Ordering Physician: Sheryn Bison [phone]) Specimen Source:  Source: Launa Grill Order Number: [id number] Lab site:   Appended Document: Colonoscopy    Clinical Lists Changes  Observations: Added new observation of COLONNXTDUE: 04/2020 (05/03/2010 15:02)

## 2010-09-19 NOTE — Procedures (Signed)
Summary: Upper Endoscopy  Patient: [name] Note: All result statuses are Final unless otherwise noted.  Tests: (1) Upper Endoscopy (EGD)   EGD Upper Endoscopy       DONE     [facility]     [address]     [place], [place]  [address]           ENDOSCOPY PROCEDURE REPORT           PATIENT:  Jason, [name]  MR#:  [id number]     BIRTHDATE:  1946/05/08, 63 yrs. old  GENDER:  male           ENDOSCOPIST:  Vania Rea. Jarold Motto, MD, [facility]     Referred by:  Julieanne Manson, M.D.           PROCEDURE DATE:  05/03/2010     PROCEDURE:  EGD, diagnostic     ASA CLASS:  Class II     INDICATIONS:  abdominal pain, nausea and vomiting           MEDICATIONS:   There was residual sedation effect present from     prior procedure.     TOPICAL ANESTHETIC:           DESCRIPTION OF PROCEDURE:   After the risks benefits and     alternatives of the procedure were thoroughly explained, informed     consent was obtained.  The [facility] GIF-H180 [id number] endoscope was     introduced through the mouth and advanced to the second portion of     the duodenum, without limitations.  The instrument was slowly     withdrawn as the mucosa was fully examined.     <<PROCEDUREIMAGES>>           S/P partial gastrectomy was found. billroyh 11 anastomosis.widely     patent.no marginal ulcers.  Gastropathy was found. profuse bile     reflux and chronic gastritis.no bleeeding.  The esophagus and     gastroesophageal junction were completely normal in appearance.     Retroflexed views revealed no masses.    The scope was then withdrawn     from the patient and the procedure completed.           COMPLICATIONS:  None           ENDOSCOPIC IMPRESSION:     1) S/p partial gastrectomy     2) Gastropathy     3) Normal esophagus     4) No masses     NAUSEA AND VOMITING AND BILE REFLUX,CHRONIC PAIN,CHRONIC     MALABSORPTION SYNDROME.     RECOMMENDATIONS:     CONSIDER ROUEX-EN -Y BILIARY DIVERSION.       REPEAT EXAM:  No           ______________________________     Vania Rea. Jarold Motto, MD, [facility]           CC:  Luretha Murphy, MD           n.     Rosalie Doctor:   Vania Rea. Patterson at 05/03/2010 02:30 PM           Gwenyth Bouillon, [id number]  Note: An exclamation mark (!) indicates a result that was not dispersed into the flowsheet. Document Creation Date: 05/03/2010 2:30 PM _______________________________________________________________________  (1) Order result status: Final Collection or observation date-time: 05/03/2010 14:18 Requested date-time:  Receipt date-time:  Reported date-time:  Referring Physician:   Ordering Physician: Sheryn Bison [phone]) Specimen  Source:  Source: Launa Grill Order Number: [id number] Lab site:

## 2010-09-21 NOTE — Letter (Signed)
Summary: [facility]   Imported By: Sherian Rein 08/31/2010 11:39:06  _____________________________________________________________________  External Attachment:    Type:   Image     Comment:   External Document

## 2010-09-23 ENCOUNTER — Emergency Department (HOSPITAL_COMMUNITY): Payer: Medicare Other

## 2010-09-23 ENCOUNTER — Emergency Department (HOSPITAL_COMMUNITY)
Admission: EM | Admit: 2010-09-23 | Discharge: 2010-09-23 | Disposition: A | Discharge status: Home / Self Care | Payer: Medicare Other | Attending: Emergency Medicine | Admitting: Emergency Medicine

## 2010-09-23 DIAGNOSIS — N5089 Other specified disorders of the male genital organs: Secondary | ICD-10-CM | POA: Insufficient documentation

## 2010-09-23 DIAGNOSIS — M2540 Effusion, unspecified joint: Secondary | ICD-10-CM

## 2010-09-23 DIAGNOSIS — R935 Abnormal findings on diagnostic imaging of other abdominal regions, including retroperitoneum: Secondary | ICD-10-CM | POA: Insufficient documentation

## 2010-09-23 DIAGNOSIS — M545 Low back pain, unspecified: Secondary | ICD-10-CM | POA: Insufficient documentation

## 2010-09-23 DIAGNOSIS — I10 Essential (primary) hypertension: Secondary | ICD-10-CM | POA: Insufficient documentation

## 2010-09-23 DIAGNOSIS — N509 Disorder of male genital organs, unspecified: Secondary | ICD-10-CM | POA: Insufficient documentation

## 2010-09-23 DIAGNOSIS — R3911 Hesitancy of micturition: Secondary | ICD-10-CM | POA: Insufficient documentation

## 2010-09-23 DIAGNOSIS — N433 Hydrocele, unspecified: Secondary | ICD-10-CM | POA: Insufficient documentation

## 2010-09-23 DIAGNOSIS — R109 Unspecified abdominal pain: Secondary | ICD-10-CM | POA: Insufficient documentation

## 2010-09-23 LAB — CBC
HCT: 38.5 % — ABNORMAL LOW (ref 39.0–52.0)
Hemoglobin: 12.9 g/dL — ABNORMAL LOW (ref 13.0–17.0)
RBC: 4.37 MIL/uL (ref 4.22–5.81)
WBC: 7.2 10*3/uL (ref 4.0–10.5)

## 2010-09-23 LAB — DIFFERENTIAL
Basophils Relative: 0 % (ref 0–1)
Eosinophils Relative: 3 % (ref 0–5)
Lymphocytes Relative: 31 % (ref 12–46)
Neutro Abs: 4.1 10*3/uL (ref 1.7–7.7)
Neutrophils Relative %: 57 % (ref 43–77)

## 2010-09-23 LAB — POCT I-STAT, CHEM 8
BUN: 17 mg/dL (ref 6–23)
Calcium, Ion: 1.1 mmol/L — ABNORMAL LOW (ref 1.12–1.32)
Chloride: 104 mEq/L (ref 96–112)
Creatinine, Ser: 1.2 mg/dL (ref 0.4–1.5)
HCT: 38 % — ABNORMAL LOW (ref 39.0–52.0)
Potassium: 4.3 mEq/L (ref 3.5–5.1)

## 2010-09-23 LAB — URINALYSIS, ROUTINE W REFLEX MICROSCOPIC
Bilirubin Urine: NEGATIVE
Hgb urine dipstick: NEGATIVE
Specific Gravity, Urine: 1.018 (ref 1.005–1.030)
Urobilinogen, UA: 0.2 mg/dL (ref 0.0–1.0)

## 2010-09-23 MED ORDER — IOHEXOL 300 MG/ML  SOLN: 100.0000 mL | Freq: Once | INTRAMUSCULAR | Status: DC | PRN

## 2010-10-03 ENCOUNTER — Other Ambulatory Visit (HOSPITAL_COMMUNITY): Payer: Self-pay | Admitting: Family Medicine

## 2010-10-03 ENCOUNTER — Ambulatory Visit (HOSPITAL_COMMUNITY)
Admission: RE | Admit: 2010-10-03 | Discharge: 2010-10-03 | Disposition: A | Discharge status: Home / Self Care | Payer: Medicare Other | Source: Ambulatory Visit | Attending: Family Medicine | Admitting: Family Medicine

## 2010-10-03 DIAGNOSIS — R1032 Left lower quadrant pain: Secondary | ICD-10-CM

## 2010-10-03 DIAGNOSIS — R140 Abdominal distension (gaseous): Secondary | ICD-10-CM

## 2010-10-03 DIAGNOSIS — Z9884 Bariatric surgery status: Secondary | ICD-10-CM | POA: Insufficient documentation

## 2010-10-03 DIAGNOSIS — R933 Abnormal findings on diagnostic imaging of other parts of digestive tract: Secondary | ICD-10-CM | POA: Insufficient documentation

## 2010-10-03 LAB — POCT I-STAT, CHEM 8
Calcium, Ion: 1.22 mmol/L (ref 1.12–1.32)
Creatinine, Ser: 1.1 mg/dL (ref 0.4–1.5)
Glucose, Bld: 106 mg/dL — ABNORMAL HIGH (ref 70–99)
HCT: 45 % (ref 39.0–52.0)
Hemoglobin: 15.3 g/dL (ref 13.0–17.0)
Potassium: 3.9 mEq/L (ref 3.5–5.1)
TCO2: 30 mmol/L (ref 0–100)

## 2010-10-03 MED ORDER — IOHEXOL 300 MG/ML  SOLN: 100.0000 mL | Freq: Once | INTRAMUSCULAR | Status: AC | PRN

## 2010-10-31 LAB — DIFFERENTIAL
Lymphocytes Relative: 8 % — ABNORMAL LOW (ref 12–46)
Lymphs Abs: 0.9 10*3/uL (ref 0.7–4.0)
Monocytes Relative: 8 % (ref 3–12)
Neutrophils Relative %: 84 % — ABNORMAL HIGH (ref 43–77)

## 2010-10-31 LAB — CBC
HCT: 37.7 % — ABNORMAL LOW (ref 39.0–52.0)
HCT: 37.9 % — ABNORMAL LOW (ref 39.0–52.0)
HCT: 38.1 % — ABNORMAL LOW (ref 39.0–52.0)
HCT: 38.2 % — ABNORMAL LOW (ref 39.0–52.0)
HCT: 39.6 % (ref 39.0–52.0)
Hemoglobin: 13.1 g/dL (ref 13.0–17.0)
Hemoglobin: 13.4 g/dL (ref 13.0–17.0)
Hemoglobin: 13.4 g/dL (ref 13.0–17.0)
Hemoglobin: 13.7 g/dL (ref 13.0–17.0)
Hemoglobin: 13.8 g/dL (ref 13.0–17.0)
MCH: 30.2 pg (ref 26.0–34.0)
MCH: 30.4 pg (ref 26.0–34.0)
MCH: 30.8 pg (ref 26.0–34.0)
MCH: 31.4 pg (ref 26.0–34.0)
MCH: 31 pg (ref 26.0–34.0)
MCHC: 35.2 g/dL (ref 30.0–36.0)
MCHC: 35.4 g/dL (ref 30.0–36.0)
MCHC: 35.9 g/dL (ref 30.0–36.0)
MCHC: 34.8 g/dL (ref 30.0–36.0)
MCV: 86 fL (ref 78.0–100.0)
MCV: 87.1 fL (ref 78.0–100.0)
MCV: 87.5 fL (ref 78.0–100.0)
MCV: 87.6 fL (ref 78.0–100.0)
MCV: 89 fL (ref 78.0–100.0)
Platelets: 172 10*3/uL (ref 150–400)
Platelets: 173 10*3/uL (ref 150–400)
Platelets: 185 10*3/uL (ref 150–400)
Platelets: 195 10*3/uL (ref 150–400)
RBC: 4.31 MIL/uL (ref 4.22–5.81)
RBC: 4.35 MIL/uL (ref 4.22–5.81)
RBC: 4.36 MIL/uL (ref 4.22–5.81)
RBC: 4.43 MIL/uL (ref 4.22–5.81)
RBC: 4.45 MIL/uL (ref 4.22–5.81)
RDW: 12.2 % (ref 11.5–15.5)
RDW: 12.6 % (ref 11.5–15.5)
RDW: 12.7 % (ref 11.5–15.5)
RDW: 12.7 % (ref 11.5–15.5)
WBC: 11.1 10*3/uL — ABNORMAL HIGH (ref 4.0–10.5)
WBC: 11.5 10*3/uL — ABNORMAL HIGH (ref 4.0–10.5)
WBC: 8.2 10*3/uL (ref 4.0–10.5)
WBC: 5.5 10*3/uL (ref 4.0–10.5)

## 2010-10-31 LAB — BASIC METABOLIC PANEL
BUN: 10 mg/dL (ref 6–23)
BUN: 13 mg/dL (ref 6–23)
BUN: 13 mg/dL (ref 6–23)
BUN: 14 mg/dL (ref 6–23)
CO2: 25 mEq/L (ref 19–32)
CO2: 26 mEq/L (ref 19–32)
CO2: 29 mEq/L (ref 19–32)
CO2: 28 mEq/L (ref 19–32)
Calcium: 8.8 mg/dL (ref 8.4–10.5)
Calcium: 9 mg/dL (ref 8.4–10.5)
Calcium: 9.3 mg/dL (ref 8.4–10.5)
Chloride: 104 mEq/L (ref 96–112)
Chloride: 97 mEq/L (ref 96–112)
Chloride: 98 mEq/L (ref 96–112)
Chloride: 103 mEq/L (ref 96–112)
Creatinine, Ser: 0.92 mg/dL (ref 0.4–1.5)
Creatinine, Ser: 1.02 mg/dL (ref 0.4–1.5)
Creatinine, Ser: 1.06 mg/dL (ref 0.4–1.5)
Creatinine, Ser: 0.93 mg/dL (ref 0.4–1.5)
GFR calc Af Amer: >60 mL/min (ref >60)
GFR calc Af Amer: >60 mL/min (ref >60)
GFR calc Af Amer: >60 mL/min (ref >60)
GFR calc Af Amer: >60 mL/min (ref >60)
GFR calc non Af Amer: >60 mL/min (ref >60)
GFR calc non Af Amer: >60 mL/min (ref >60)
GFR calc non Af Amer: >60 mL/min (ref >60)
Glucose, Bld: 112 mg/dL — ABNORMAL HIGH (ref 70–99)
Glucose, Bld: 151 mg/dL — ABNORMAL HIGH (ref 70–99)
Glucose, Bld: 154 mg/dL — ABNORMAL HIGH (ref 70–99)
Glucose, Bld: 94 mg/dL (ref 70–99)
Potassium: 4.1 mEq/L (ref 3.5–5.1)
Potassium: 4.7 mEq/L (ref 3.5–5.1)
Potassium: 5 mEq/L (ref 3.5–5.1)
Sodium: 133 mEq/L — ABNORMAL LOW (ref 135–145)
Sodium: 133 mEq/L — ABNORMAL LOW (ref 135–145)
Sodium: 141 mEq/L (ref 135–145)

## 2010-11-02 ENCOUNTER — Other Ambulatory Visit (HOSPITAL_COMMUNITY): Payer: Medicare Other

## 2010-11-06 LAB — CBC
MCHC: 34.9 g/dL (ref 30.0–36.0)
MCV: 91.6 fL (ref 78.0–100.0)
RBC: 4.31 MIL/uL (ref 4.22–5.81)

## 2010-11-06 LAB — COMPREHENSIVE METABOLIC PANEL
CO2: 26 mEq/L (ref 19–32)
Calcium: 8.6 mg/dL (ref 8.4–10.5)
Creatinine, Ser: 0.77 mg/dL (ref 0.4–1.5)
GFR calc Af Amer: >60 mL/min (ref >60)
GFR calc non Af Amer: >60 mL/min (ref >60)
Glucose, Bld: 115 mg/dL — ABNORMAL HIGH (ref 70–99)

## 2010-11-06 LAB — PROTIME-INR: INR: 1.04 (ref 0.00–1.49)

## 2010-11-06 LAB — LIPASE, BLOOD: Lipase: 19 U/L (ref 11–59)

## 2010-11-06 LAB — POCT CARDIAC MARKERS: Troponin i, poc: <0.05 ng/mL (ref 0.00–0.09)

## 2010-11-26 LAB — COMPREHENSIVE METABOLIC PANEL
ALT: 28 U/L (ref 0–53)
AST: 33 U/L (ref 0–37)
Albumin: 3.8 g/dL (ref 3.5–5.2)
Alkaline Phosphatase: 55 U/L (ref 39–117)
BUN: 15 mg/dL (ref 6–23)
CO2: 29 mEq/L (ref 19–32)
Calcium: 9.3 mg/dL (ref 8.4–10.5)
Chloride: 101 mEq/L (ref 96–112)
Creatinine, Ser: 1 mg/dL (ref 0.4–1.5)
GFR calc Af Amer: >60 mL/min (ref >60)
GFR calc non Af Amer: >60 mL/min (ref >60)
Glucose, Bld: 132 mg/dL — ABNORMAL HIGH (ref 70–99)
Potassium: 4.7 mEq/L (ref 3.5–5.1)
Sodium: 138 mEq/L (ref 135–145)
Total Bilirubin: 0.9 mg/dL (ref 0.3–1.2)
Total Protein: 7.9 g/dL (ref 6.0–8.3)

## 2010-11-26 LAB — CBC
HCT: 44.3 % (ref 39.0–52.0)
Hemoglobin: 15.4 g/dL (ref 13.0–17.0)
MCHC: 34.7 g/dL (ref 30.0–36.0)
MCV: 92.8 fL (ref 78.0–100.0)
Platelets: 192 10*3/uL (ref 150–400)
RBC: 4.78 MIL/uL (ref 4.22–5.81)
RDW: 12.8 % (ref 11.5–15.5)
WBC: 5 10*3/uL (ref 4.0–10.5)

## 2011-01-02 NOTE — Op Note (Signed)
NAME:  [name], [name]          ACCOUNT NO.:  [id number]   MEDICAL RECORD NO.:  [id number]          PATIENT TYPE:  INP   LOCATION:  2550                         FACILITY:  [facility]   PHYSICIAN:  Artist Pais. Weingold, M.D.DATE OF BIRTH:  1945/08/29   DATE OF PROCEDURE:  03/07/2009  DATE OF DISCHARGE:  03/07/2009                               OPERATIVE REPORT   PREOPERATIVE DIAGNOSIS:  Right long finger distal interphalangeal joint  level injury.   POSTOPERATIVE DIAGNOSIS:  Right long finger distal interphalangeal joint  level injury.   PROCEDURE:  Incision and drainage, distal interphalangeal level  amputation with volar V-Y flap.   SURGEON:  Artist Pais. Mina Marble, MD   ASSISTANT:  None.   ANESTHESIA:  General.   TOURNIQUET TIME:  28 minutes.   COMPLICATIONS:  None.   DRAINS:  None.   OPERATIVE REPORT:  The patient was taken to operating suite.  After  induction of general anesthesia, right upper extremity was prepped and  draped in sterile fashion.  Esmarch was used to exsanguinate limb.  Tourniquet was then inflated to 215 mm.  At this point in time, the  patient's right long finger was examined.  There was a tip amputation  with a small remnant distal phalanx left to the DIP joint, this was  carefully dissected free and removed.  A rongeur was used to smooth out  the condyles of the head of the middle phalanx, bilateral neurectomies  were performed.  At this point in time, a volar V-Y flap was carefully  elevated and advanced distally to close the soft tissue defect.  This  was sutured with 4-0 Vicryl Rapide.  After this was done, the patient  had 0.25% plain Marcaine injected into the flexor sheath with a postop  pain control and was placed in sterile dressing of Xeroform, 4 x 4s, and  a compression wrap with Coban.  The patient tolerated the procedure well  and went to recovery in stable fashion.      Artist Pais Mina Marble, M.D.  Electronically Signed     Artist Pais. Mina Marble, M.D.  Electronically Signed   MAW/MEDQ  D:  03/07/2009  T:  03/08/2009  Job:  [id number]

## 2011-01-02 NOTE — Consult Note (Signed)
NAME:  CID HAGER, COMPSTON          ACCOUNT NO.:  [id number]   MEDICAL RECORD NO.:  [id number]          PATIENT TYPE:  INP   LOCATION:  2550                         FACILITY:  [facility]   PHYSICIAN:  Artist Pais. Weingold, M.D.DATE OF BIRTH:  10-09-1945   DATE OF CONSULTATION:  DATE OF DISCHARGE:  03/07/2009                                 CONSULTATION   REQUESTING PHYSICIAN:  Trudi Ida. Denton Lank, MD   REASON FOR CONSULTATION:  Mr. Hansel Feinstein is a 65 year old right-hand  dominant male who got his dominant hand caught under a lawn mower today  who presents today with what appears to an amputation at DIP joint level  of dominant right long finger.  He is 65 years old.  He has no known  drug allergies.  He is currently taking an antihypertensive, but cannot  recall the name of it.  He has no other significant past medical  history.  He does not drink or smoke excessively.  He had been a heavy  smoker in the past.   FAMILY MEDICAL HISTORY:  Noncontributory.   PHYSICAL EXAMINATION:  A well-nourished male, pleasant, alert and  oriented x3.  Examination of his hand, on the right he has an obvious  DIP level injury with exposed distal phalangeal bone.  The amputated  part is not available for examination.   X-rays show a small remnant of the distal phalanx at the distal  interphalangeal joint.  No other significant osseous abnormalities  noted.   IMPRESSION:  This is a 65 year old male with an obvious open injury to  his dominant right long finger at the distal interphalangeal joint  level.  I had a thorough and fine discussion with Mr. Hansel Feinstein and with  his family members regarding treatment options and recommended he will  be taken to the operating room for an I&D, endarterectomies, and primary  closure with skeletal shortening as necessary.  He understands risks and  benefits to go with this as soon as possible here at [facility].      Molli Hazard A. Mina Marble, M.D.  Electronically  Signed     MAW/MEDQ  D:  03/07/2009  T:  03/08/2009  Job:  [id number]

## 2011-01-05 NOTE — Discharge Summary (Signed)
[place]  Patient:    [name], [name] Visit Number: [id number] MRN: [id number]          Service Type: MED Location: [facility] 2902 01 Attending Physician:  Phifer, Trinna Post Dictated by:   Juanell Fairly, M.D. Admit Date:  10/01/2001 Disc. Date: 10/06/01   CC:         Alvia Grove, M.D.   Discharge Summary  DATE OF BIRTH:  Nov 05, 1945  DISCHARGE DIAGNOSES: 1. Gastrointestinal bleed. 2. Iron deficiency anemia. 3. B12 deficiency anemia. 4. Hypokalemia.  DISCHARGE MEDICATIONS:  None.  FOLLOWUP:  He will be discharged with instructions to call [facility] at [phone] for an appointment next week.  HISTORY OF PRESENT ILLNESS:  [name] is a 65 year old male with a previous medical history of anemia who presented from [facility] with an acute drop in hemoglobin.  His hemoglobin dropped from 9.6 to 6.9 in one month.  He stated he is tired all the time, particularly with minimal activity.  Patient was admitted in May 2002 with a hemoglobin of 4.4, transfused 3 units, and discharged with a hemoglobin of 8.3.  Work-up at that time included a normal EGD, a normal colonoscopy, iron of 48, total iron binding capacity 381, percent saturation 13, reticulocyte count 0.4, B12 227. CEA was also obtained which was less than 0.5 and PSA was 0.56.  TSH 1.227.  PHYSICAL EXAMINATION  HEART:  Systolic ejection murmur.  RECTAL:  Guaiac positive stool which was melanotic.  LABORATORIES:  White count 6.8, hemoglobin 6.0, hematocrit 19.6, platelet count 237,000, MCV 71.3, MCHC 30.7, RDW 19.2.  PT 14.6, INR 1.2, PTT 26. Sodium 142, potassium 3.6, chloride 110, bicarbonate 28, BUN 18, creatinine 0.8, glucose 100, calcium 8.2, protein 6.7, albumin 3.1, AST 21, ALT 13, alkaline phosphatase 39, total bilirubin 0.4.  HOSPITAL COURSE:  #1 - GASTROINTESTINAL BLEED:  Patient underwent EGD on October 03, 2001  which revealed bleeding from the anastomosis of the patients [name].  This was endo clipped.  Patient later had more bleeding including bright red blood per rectum during bowel prep for barium enema and small bowel follow through. Once the bowel prep was finished the patient stopped having bright red blood per rectum and his hemoglobin remained stable between 9.5 and 9.8 for the rest of his admission.  #2 - IRON DEFICIENCY ANEMIA:  The patient had a ferritin level of less than 1, an iron of 17, total iron binding capacity 345, percent saturation of 5. Patient states he has been taking supplemental iron since discharge from his last hospitalization.  I believe that the patient is taking his supplements but secondary to his [name] I feel the patient is not absorbing iron and that this is the cause of his iron deficiency anemia.  Another possibility is celiac sprue; however, small bowel follow through showed no abnormality of the small bowel but the terminal ileum and ileocecal valve not well seen.  Patient was placed on parenteral iron and received two doses of that.  He also received four units of packed red blood cells and under the advice of GI we discontinued the parenteral iron as GI stated he will get the supplemental iron with the packed red blood cells.  Again, he received 4 units over the course of his hospitalization.  #3 - B12 DEFICIENCY ANEMIA:  This is also believed to be secondary to the patients [name] antrectomy and gastrojejunostomy.  He  was given IM B12 for four days while in hospital and will need monthly B12 shots.  #4 - HYPOKALEMIA:  This is either secondary to GI loss with bowel prep or B12 repletion or both.  On discharge the patients potassium was 3.8 after repletion.  #5 - CHRONIC OBSTRUCTIVE PULMONARY DISEASE:  The patient has never been on medications and has never had any symptoms.  This was not an issue during this hospitalization.  #6 -  MIGRAINES:  Patient did not complain of migraines and is unsure whether this history is real or not.  #7 - LEG PAIN:  Again, patient did not complain of any leg pain while in hospital and did not give a history of leg pain.  This, as with the migraines, was extracted from old charts and was not an issue during this hospitalization.  Additional studies include a colonoscopy which was not performed secondary to poor bowel prep and colonic loop formation.  Patient was recommended to undergo an air contrast barium enema.  The air contrast barium enema was scheduled for Saturday the 15th and subsequently canceled as this particular study is not done on weekends.  The patient did, however, have an air contrast barium enema in June 2002 which showed a question of a mass in the hepatic flexure.  This was followed by a colonoscopy in August 2002 which showed a normal colon to the cecum and no mass in the hepatic flexure.  FOLLOWUP: 1. This patient is going to need supplemental iron as he is not absorbing iron    through his GI tract.  This will have to be either IV or IM and on a weekly    to monthly basis depending on patients hemoglobin status. 2. Patient is going to need monthly B12 shots for his B12 anemia. 3. It is recommended that potassium be routinely checked as B12 repletion can    decrease serum potassium. 4. GI suggested that the patient undergo another air contrast barium enema.    This was not done in hospital and will need to be set up as an outpatient    if the patients primary physician feels it is warranted.Dictated by: Juanell Fairly, M.D. Attending Physician:  Phifer, Trinna Post DD:  10/06/01 TD:  10/06/01 Job: [id number]

## 2011-01-05 NOTE — H&P (Signed)
[place]  Patient:    [name], [name] Visit Number: [id number] MRN: [id number]          Service Type: MED Location: [phone] Attending Physician:  Anastasio Auerbach Admit Date:  01/03/2001                           History and Physical  CHIEF COMPLAINT: Fatigue.  HISTORY OF PRESENT ILLNESS: History is obtained mainly from the son.  The patient is [name] and does not speak [place].  This 65 year old gentleman, who has been here now about three to four years, had been in somewhat good health up until about the past six months, from when the son said he has had a slow decline in energy, weight loss, and just not feeling well in general apparently.  This was not acute enough for him to seek any attention and he therefore did not go anywhere up until recently when they got him an appointment at [facility] a few days ago.  He apparently was seen there and evaluated and then they called and said that he was anemic but they did not know a value of low blood.  That prompted them, I think, to come to the emergency room this evening for further evaluation, where he was found to be markedly anemic at 4.4.  Stool according to the ER was brown but trace positive for blood.  He was not orthostatic.  PT and PTT were normal.  Chem panel was unremarkable.  He does give a history of peptic ulcer surgery in [place] maybe 10-12 years ago and then about six years later it sounds like he may have had a partial small bowel obstruction that required surgery. Apparently he suffered an MI intraoperatively or around the operation of his initial peptic ulcer.  The son said he does complain of some heartburn maybe two to three times a week and he does not use anything for it.  It does not sound like he has excessive NSAIDs or aspirin use.  No bleeding history as far as vomiting blood or black stools.  His weight has gone down and his appetite has diminished over the  past six months.  He is being admitted now for transfusion and further evaluation of his anemia.  PAST MEDICAL HISTORY: Peptic disease 10-12 years ago requiring surgery in [place], but they stated he did not have any hemorrhage at that time, then a small bowel obstruction surgery about six years later.  A murmur was heard on his examination and he says that has been known.  FAMILY HISTORY: Significant for his father dying with generalized cancer, according to the son.  SOCIAL HISTORY: He is a Public affairs consultant.  He lives with his son.  His wife is in [place].  He has three children, two boys and one girl.  He smokes less than a pack a day.  No alcohol.  ALLERGIES: None.  PHYSICAL EXAMINATION:  VITAL SIGNS: Blood pressure 120/58, pulse 78 and regular.  HEENT: TMs and canals clear.  Bilateral arcus.  PERRL.  EOMI.  Discs flat. Nose and throat clear.  NECK: Supple.  No adenopathy, JVD, bruits, or enlarged thyroid.  CHEST: Clear, nontender.  CARDIAC: RR, 3/7 systolic murmur heard across the precordium, even into the axilla.  ABDOMEN: Previous surgical scars, normal bowel sounds; soft; nontender; no masses or organomegaly.  RECTAL: Not done as the ER doc had done that.  EXTREMITIES: Peripheral pulses  intact, no peripheral edema.  ASSESSMENT:  1. Anemia, suspect gastrointestinal related.  2. Heart murmur.  PLAN:  1. Transfuse, probable three units.  2. GI consult, rule out malignancy.  3. Obtain 2D echocardiogram. Attending Physician:  Anastasio Auerbach DD:  01/03/01 TD:  01/05/01 Job: [id number]

## 2011-01-05 NOTE — Procedures (Signed)
[facility]  Patient:    [name], [name]                         MRN: [id number] Proc. Date: 03/24/01 Attending:  Verlin Grills, M.D. CC:         [name]. Audria Nine, M.D., [facility]  Anastasio Auerbach, M.D.   Procedure Report  PROCEDURE:  Colonoscopy.  PROCEDURE INDICATION:  [name] (date of birth [birth year]) is a 65 year old male. [name] was hospitalized at [facility] from Jan 01, 2001 to Jan 06, 2001, by Dr. Anastasio Auerbach.  The patient was admitted with microcytic anemia and Hemoccult positive stool.  His Jan 06, 2001, esophagogastroduodenoscopy was normal, status post antrectomy with Billroth II anastomosis.  His Jan 06, 2001, flexible proctosigmoidoscopy performed to 50 cm was normal except for the removal of a 3 mm hyperplastic polyp noted at 25 cm from the anal verge.  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  [name] is referred for diagnostic colonoscopy.  MEDICATION ALLERGIES:  None.  PAST MEDICAL HISTORY:  Peptic ulcer disease surgery 10-12 years ago. Small-bowel obstruction surgery approximately six years ago.  HABITS:  [name] smokes less than 1 pack of cigarettes per day.  He does not consume alcohol.  ENDOSCOPIST:  Verlin Grills, M.D.  PREMEDICATION:  Versed 10 mg, Demerol 50 mg.  ENDOSCOPE:  Olympus pediatric colonoscope.  DESCRIPTION OF PROCEDURE:  After obtaining informed consent, [name] was placed in the left lateral decubitus position.  I administered intravenous Demerol and intravenous Versed to achieve conscious sedation for the procedure.  The patients blood pressure, oxygen saturations, and cardiac rhythm were monitored throughout the procedure and documented in the medical record.  Anal inspection was normal.  Digital rectal exam was normal.  The Olympus pediatric video colonoscope was introduced  into the rectum and with some difficulty, it advanced to the cecum, as identified by a normal-appearing ileocecal valve and transillumination of the endoscopic light in the right lower quadrant.  Colonic preparation for the exam today was excellent.  RECTUM:  Normal.  SIGMOID COLON AND DESCENDING COLON:  Normal.  SPLENIC FLEXURE:  Normal.  TRANSVERSE COLON:  Normal.  HEPATIC FLEXURE:  Normal.  ASCENDING COLON:  Normal.  CECUM AND ILEOCECAL VALVE:  Normal.  ASSESSMENT:  Normal proctocolonoscopy to the cecum.  No endoscopic evidence for the presence of colorectal neoplasia.  Close examination of the right colon, hepatic flexure revealed no abnormality, as suspected by air contrast barium enema. DD:  03/24/01 TD:  03/24/01 Job: [id number]

## 2011-01-19 ENCOUNTER — Other Ambulatory Visit: Payer: Self-pay | Admitting: Neurosurgery

## 2011-01-19 DIAGNOSIS — M549 Dorsalgia, unspecified: Secondary | ICD-10-CM

## 2011-01-24 ENCOUNTER — Ambulatory Visit
Admission: RE | Admit: 2011-01-24 | Discharge: 2011-01-24 | Disposition: A | Discharge status: Home / Self Care | Payer: Medicare Other | Source: Ambulatory Visit | Attending: Neurosurgery | Admitting: Neurosurgery

## 2011-01-24 DIAGNOSIS — M549 Dorsalgia, unspecified: Secondary | ICD-10-CM

## 2011-03-13 ENCOUNTER — Ambulatory Visit (HOSPITAL_COMMUNITY): Payer: Medicare Other

## 2011-03-13 ENCOUNTER — Inpatient Hospital Stay (HOSPITAL_COMMUNITY)
Admission: RE | Admit: 2011-03-13 | Discharge: 2011-03-16 | DRG: 491 | Disposition: A | Discharge status: Home / Self Care | Payer: Medicare Other | Source: Ambulatory Visit | Attending: Neurosurgery | Admitting: Neurosurgery

## 2011-03-13 DIAGNOSIS — K219 Gastro-esophageal reflux disease without esophagitis: Secondary | ICD-10-CM | POA: Diagnosis present

## 2011-03-13 DIAGNOSIS — J42 Unspecified chronic bronchitis: Secondary | ICD-10-CM | POA: Diagnosis present

## 2011-03-13 DIAGNOSIS — M48062 Spinal stenosis, lumbar region with neurogenic claudication: Principal | ICD-10-CM | POA: Diagnosis present

## 2011-03-13 DIAGNOSIS — I10 Essential (primary) hypertension: Secondary | ICD-10-CM | POA: Diagnosis present

## 2011-03-13 LAB — CBC
Platelets: 205 10*3/uL (ref 150–400)
RBC: 4.8 MIL/uL (ref 4.22–5.81)
RDW: 12.4 % (ref 11.5–15.5)
WBC: 5.8 10*3/uL (ref 4.0–10.5)

## 2011-03-13 LAB — BASIC METABOLIC PANEL
CO2: 30 mEq/L (ref 19–32)
Calcium: 9.8 mg/dL (ref 8.4–10.5)
Chloride: 102 mEq/L (ref 96–112)
GFR calc Af Amer: >60 mL/min (ref >60)
Sodium: 140 mEq/L (ref 135–145)

## 2011-03-13 LAB — SURGICAL PCR SCREEN
MRSA, PCR: NEGATIVE
Staphylococcus aureus: NEGATIVE

## 2011-03-20 NOTE — Discharge Summary (Signed)
  NAME:  [name], [name] NO.:  [id number]  MEDICAL RECORD NO.:  [id number]  LOCATION:  3005                         FACILITY:  [facility]  PHYSICIAN:  Hilda Lias, M.D.   DATE OF BIRTH:  1945/10/02  DATE OF ADMISSION:  03/13/2011 DATE OF DISCHARGE:  03/16/2011                              DISCHARGE SUMMARY   ADMISSION DIAGNOSIS:  Lumbar stenosis with neurogenic claudication.  FINAL DIAGNOSES:  Lumbar stenosis with neurogenic claudication.  CLINICAL HISTORY:  The patient was admitted because of back pain with radiation down to both the legs.  X-ray showed severe stenosis at the level of L3-4 and L4-5.  Surgery was advised.  LABORATORY DATA:  Normal.  COURSE IN THE HOSPITAL:  The patient was taken to surgery and decompressive laminectomy and foraminotomy was done.  There was an area at the level L4-5 where he had quite a bit of arachnoid pouch.  Because of that, he came for __________  until this morning.  Today, he is ambulating, has no pain.  He has no incisional pain, no weakness, and he wants to go home.  CONDITION ON DISCHARGE:  Improvement.  MEDICATION:  Oxycodone, Flexeril.  DIET:  Regular.  ACTIVITY:  Not to drive for 2 weeks.  FOLLOWUP:  He will call my office to have the staples removed in 10 days.  Any question between now and then, he or his family is to call.          ______________________________ Hilda Lias, M.D.     EB/MEDQ  D:  03/16/2011  T:  03/17/2011  Job:  [id number]  Electronically Signed by Hilda Lias M.D. on 03/20/2011 03:33:48 PM

## 2011-03-20 NOTE — Op Note (Signed)
  NAME:  [name], LEGE NO.:  [id number]  MEDICAL RECORD NO.:  [id number]  LOCATION:  3005                         FACILITY:  [facility]  PHYSICIAN:  Hilda Lias, M.D.   DATE OF BIRTH:  1946/03/16  DATE OF PROCEDURE:  03/13/2011 DATE OF DISCHARGE:                              OPERATIVE REPORT   PREOPERATIVE DIAGNOSES:  Lumbar stenosis with bilateral radiculopathy, lumbar; neurogenic claudication, L3 through L5.  POSTOPERATIVE DIAGNOSES:  Lumbar stenosis with bilateral radiculopathy, lumbar; neurogenic claudication, L3 through L5.  PROCEDURES:  Bilateral L3, L4, L5 laminectomy, decompression of the thecal sac, foraminotomy, microscope.  SURGEON:  Hilda Lias, MD  ASSISTANT:  Hewitt Shorts, MD  CLINICAL HISTORY:  This is a 65 year old gentleman complaining of back pain with radiation to both legs.  The patient had conservative treatment.  He has failed with conservative treatment.  The patient hadan MRI, which showed stenosis from L3-4 down to L5-S1.  Surgery was advised in view of no improvement.  The patient knows the risk with the surgery including the possibility of no improvement.  PROCEDURE:  The patient was taken to the OR, and after intubation, he was positioned in a prone manner.  The back was cleaned with DuraPrep. Midline incision from L3-4 to L5-S1 was made, and the muscles were retracted laterally.  We proceeded with removal of the spinous process of L3, L4, L5.  Then with the drill, we did a laminectomy of three levels.  Then, we brought the microscope into the area, and we started doing removal of thick yellow ligament laterally.  Foraminotomy to decompress the L3, L4, and L5 as well as S1 nerve root was done.  The worst part was at the level of L4-5 where the dura matter was quite thin, and the patient had quite a bit of calcified ligament. Decompression was achieved.  There was an arachnoid pouch, which was taken care with two single  stitch of Prolene 6-0.  We continued the procedure.  At the end, we had a plenty of space at the thecal sac from L3 down to L5-S1 with plenty of space for the foramen.  Valsalva maneuver up to 50 was negative.  Then, fibrinogen glue was used in the lower part.  Hemostasis was accomplished.  Irrigation was done.  Once we accomplished good hemostasis, the wound was closed in three different layers using Vicryls and staples for the skin.  The patient is going to go to PACU.         ______________________________ Hilda Lias, M.D.    EB/MEDQ  D:  03/13/2011  T:  03/14/2011  Job:  [id number]  Electronically Signed by Hilda Lias M.D. on 03/20/2011 03:33:46 PM

## 2011-05-15 LAB — POCT I-STAT, CHEM 8
Calcium, Ion: 1.16
Creatinine, Ser: 1
Glucose, Bld: 115 — ABNORMAL HIGH
HCT: 40
Hemoglobin: 13.6
TCO2: 29

## 2011-05-15 LAB — URINALYSIS, ROUTINE W REFLEX MICROSCOPIC
Bilirubin Urine: NEGATIVE
Hgb urine dipstick: NEGATIVE
Nitrite: NEGATIVE
Protein, ur: NEGATIVE
Specific Gravity, Urine: 1.02
Urobilinogen, UA: 1

## 2011-05-15 LAB — DIFFERENTIAL
Basophils Absolute: 0
Basophils Relative: 0
Eosinophils Absolute: 0
Monocytes Relative: 5
Neutro Abs: 9.8 — ABNORMAL HIGH
Neutrophils Relative %: 88 — ABNORMAL HIGH

## 2011-05-15 LAB — POCT CARDIAC MARKERS
CKMB, poc: <1 — ABNORMAL LOW
Myoglobin, poc: 86.4
Operator id: 196461

## 2011-05-15 LAB — CBC
MCHC: 31.6
MCV: 73.9 — ABNORMAL LOW
Platelets: 304
RBC: 4.96
RDW: 16.5 — ABNORMAL HIGH

## 2011-09-03 ENCOUNTER — Emergency Department (HOSPITAL_COMMUNITY): Payer: Medicare Other

## 2011-09-03 ENCOUNTER — Emergency Department (HOSPITAL_COMMUNITY)
Admission: EM | Admit: 2011-09-03 | Discharge: 2011-09-03 | Disposition: A | Discharge status: Home / Self Care | Payer: Medicare Other | Attending: Emergency Medicine | Admitting: Emergency Medicine

## 2011-09-03 ENCOUNTER — Encounter (HOSPITAL_COMMUNITY): Payer: Self-pay | Admitting: Emergency Medicine

## 2011-09-03 ENCOUNTER — Other Ambulatory Visit: Payer: Self-pay

## 2011-09-03 DIAGNOSIS — R059 Cough, unspecified: Secondary | ICD-10-CM | POA: Insufficient documentation

## 2011-09-03 DIAGNOSIS — R079 Chest pain, unspecified: Secondary | ICD-10-CM | POA: Insufficient documentation

## 2011-09-03 DIAGNOSIS — M546 Pain in thoracic spine: Secondary | ICD-10-CM | POA: Insufficient documentation

## 2011-09-03 DIAGNOSIS — Z79899 Other long term (current) drug therapy: Secondary | ICD-10-CM | POA: Insufficient documentation

## 2011-09-03 DIAGNOSIS — R05 Cough: Secondary | ICD-10-CM | POA: Insufficient documentation

## 2011-09-03 DIAGNOSIS — I10 Essential (primary) hypertension: Secondary | ICD-10-CM | POA: Insufficient documentation

## 2011-09-03 HISTORY — DX: Essential (primary) hypertension: I10

## 2011-09-03 LAB — CBC
Hemoglobin: 14 g/dL (ref 13.0–17.0)
MCH: 30.2 pg (ref 26.0–34.0)
MCHC: 34.4 g/dL (ref 30.0–36.0)
RDW: 13.5 % (ref 11.5–15.5)

## 2011-09-03 LAB — COMPREHENSIVE METABOLIC PANEL
ALT: 25 U/L (ref 0–53)
Albumin: 3.5 g/dL (ref 3.5–5.2)
Alkaline Phosphatase: 75 U/L (ref 39–117)
Calcium: 9.1 mg/dL (ref 8.4–10.5)
GFR calc Af Amer: >90 mL/min (ref >90)
Glucose, Bld: 99 mg/dL (ref 70–99)
Potassium: 4.3 mEq/L (ref 3.5–5.1)
Sodium: 137 mEq/L (ref 135–145)
Total Protein: 7.7 g/dL (ref 6.0–8.3)

## 2011-09-03 LAB — PROTIME-INR: Prothrombin Time: 14.1 seconds (ref 11.6–15.2)

## 2011-09-03 LAB — APTT: aPTT: 28 seconds (ref 24–37)

## 2011-09-03 LAB — POCT I-STAT TROPONIN I: Troponin i, poc: 0 ng/mL (ref 0.00–0.08)

## 2011-09-03 MED ORDER — ASPIRIN 81 MG PO CHEW
324.0000 mg | CHEWABLE_TABLET | Freq: Once | ORAL | Status: AC
  Administered 2011-09-03: 324 mg via ORAL
  Filled 2011-09-03: qty 4

## 2011-09-03 MED ORDER — SODIUM CHLORIDE 0.9 % IV SOLN: 20.0000 mL | INTRAVENOUS | Status: DC

## 2011-09-03 MED ORDER — NITROGLYCERIN 2 % TD OINT: 1.0000 [in_us] | TOPICAL_OINTMENT | Freq: Four times a day (QID) | TRANSDERMAL | Status: DC

## 2011-09-03 MED ORDER — TRAMADOL HCL 50 MG PO TABS: 50.0000 mg | ORAL_TABLET | Freq: Four times a day (QID) | ORAL | Status: AC | PRN

## 2011-09-03 NOTE — ED Provider Notes (Signed)
History     CSN: [id number]  Arrival date & time 09/03/11  4696   First MD Initiated Contact with Patient 09/03/11 0930      CC: Chest pain  HPI Comments: Family called the office today and was told to come to the ED.  Patient is a 66 y.o. male presenting with chest pain. The history is provided by the patient and a relative. The history is limited by a language barrier.  Chest Pain The chest pain began 1 - 2 weeks ago. Chest pain occurs constantly. The chest pain is unchanged. The pain is associated with coughing. The severity of the pain is moderate. Quality: "just a pain" The pain radiates to the upper back (starts on left). Primary symptoms include cough. Pertinent negatives for primary symptoms include no fever and no vomiting. Risk factors: HTN, no cholesterol, no smoking.   No recent trips or travel.    Past Medical History  Diagnosis Date  . Hypertension     History reviewed. No pertinent past surgical history.  No family history on file.  History  Substance Use Topics  . Smoking status: Not on file  . Smokeless tobacco: Not on file  . Alcohol Use: Not on file      Review of Systems  Constitutional: Negative for fever.  Respiratory: Positive for cough.   Cardiovascular: Positive for chest pain.  Gastrointestinal: Negative for vomiting.  All other systems reviewed and are negative.    Allergies  Review of patient's allergies indicates no known allergies.  Home Medications   Current Outpatient Rx  Name Route Sig Dispense Refill  . ALBUTEROL SULFATE HFA 108 (90 BASE) MCG/ACT IN AERS Inhalation Inhale 3 puffs into the lungs every 6 (six) hours as needed. As needed for shortness of breath.    . AMOXICILLIN 500 MG PO CAPS Oral Take 500 mg by mouth 3 (three) times daily. For 7 days for tooth removal. Last dose of course was 09/03/11 am.    . FINASTERIDE 5 MG PO TABS Oral Take 5 mg by mouth daily.    Marland Kitchen LISINOPRIL 20 MG PO TABS Oral Take 20 mg by mouth at bedtime.     Marland Kitchen LOSARTAN POTASSIUM 50 MG PO TABS Oral Take 50 mg by mouth daily.    Marland Kitchen RANITIDINE HCL 150 MG PO TABS Oral Take 150 mg by mouth daily.    Marland Kitchen TAMSULOSIN HCL 0.4 MG PO CAPS Oral Take 0.4 mg by mouth daily.      There were no vitals taken for this visit.  Physical Exam  Nursing note and vitals reviewed. Constitutional: He appears well-developed and well-nourished. No distress.  HENT:  Head: Normocephalic and atraumatic.  Right Ear: External ear normal.  Left Ear: External ear normal.  Eyes: Conjunctivae are normal. Right eye exhibits no discharge. Left eye exhibits no discharge. No scleral icterus.  Neck: Neck supple. No tracheal deviation present.  Cardiovascular: Normal rate, regular rhythm and intact distal pulses.   Pulmonary/Chest: Effort normal and breath sounds normal. No stridor. No respiratory distress. He has no wheezes. He has no rales.  Abdominal: Soft. Bowel sounds are normal. He exhibits no distension. There is no tenderness. There is no rebound and no guarding.  Musculoskeletal: He exhibits no edema and no tenderness.  Neurological: He is alert. He has normal strength. No sensory deficit. Cranial nerve deficit:  no gross defecits noted. He exhibits normal muscle tone. He displays no seizure activity. Coordination normal.  Skin: Skin is warm and dry.  No rash noted.  Psychiatric: He has a normal mood and affect.    ED Course  Procedures (including critical care time)  Date: 09/03/2011  Rate: 84  Rhythm: normal sinus rhythm  QRS Axis: normal  Intervals: normal  ST/T Wave abnormalities: normal  Conduction Disutrbances:none  Narrative Interpretation:   Old EKG Reviewed: unchanged   Labs Reviewed  COMPREHENSIVE METABOLIC PANEL - Abnormal; Notable for the following:    GFR calc non Af Amer 89 (*)    All other components within normal limits  CBC  PROTIME-INR  APTT  POCT I-STAT TROPONIN I  I-STAT TROPONIN I  I-STAT TROPONIN I  I-STAT TROPONIN I   Dg Chest 2  View  09/03/2011  *RADIOLOGY REPORT*  Clinical Data: Chest pain.  CHEST - 2 VIEW  Comparison: 03/13/2011  Findings: Heart size and vascularity are normal and the lungs are clear of acute infiltrates and effusions.  Slight scarring in both upper lobes with bilateral apical pleural thickening.  No acute osseous abnormality.  IMPRESSION: No acute abnormalities.  Original Report Authenticated By: Gwynn Burly, M.D.     1. Chest pain       MDM  The patient presents with chest pain ongoing for 10 days. I doubt cardiac etiology and that this has been constant pain. His EKG and cardiac enzymes are normal. The symptoms are not suggestive of pulmonary embolus or thoracic aortic dissection. They are rather mild in nature. He does not have evidence of pneumonia. As possible he has mild pleurisy associated with the coughing that he has had. I will try him on a course of pain medications and have him followup with his primary Dr. to be rechecked        Celene Kras, MD 09/03/11 1202

## 2011-09-03 NOTE — ED Notes (Signed)
EKG completed in triage [phone]

## 2011-09-03 NOTE — ED Notes (Signed)
Patient resting comfortably on stretcher denies any chest pain 0/10.

## 2011-09-03 NOTE — ED Notes (Signed)
Onset ten days ago left sided chest pain with intermittent shortness of breath. Ax4

## 2011-09-03 NOTE — ED Notes (Signed)
IV tip intact

## 2011-09-03 NOTE — ED Notes (Signed)
EKGs handed to Roselyn Bering, MD and pt is undressed, in gown, on monitor, continuous pulse oximetry and blood pressure cuff

## 2011-09-03 NOTE — ED Notes (Signed)
Patient and family and bedside wife and daughter speaking spanish. Onset of left sided chest pain 10 days ago constant. Currently pain is 9/10 pressure with intermittent shortness of breath.  Airway intact bilateral equal chest rise and fall.  Family stated patient moved a heavy piece of furniture recently however chest pain was present prior to moving the furniture.  Resting comfortably on stretcher. Ax4

## 2012-03-24 ENCOUNTER — Encounter (INDEPENDENT_AMBULATORY_CARE_PROVIDER_SITE_OTHER): Payer: Self-pay | Admitting: General Surgery

## 2013-01-16 ENCOUNTER — Encounter: Payer: Self-pay | Admitting: Gastroenterology

## 2013-01-19 ENCOUNTER — Encounter: Payer: Self-pay | Admitting: *Deleted

## 2013-01-21 ENCOUNTER — Encounter: Payer: Self-pay | Admitting: *Deleted

## 2013-01-27 ENCOUNTER — Ambulatory Visit: Payer: Medicare Other | Admitting: Gastroenterology

## 2013-02-03 ENCOUNTER — Encounter: Payer: Self-pay | Admitting: Gastroenterology

## 2013-02-03 ENCOUNTER — Ambulatory Visit (INDEPENDENT_AMBULATORY_CARE_PROVIDER_SITE_OTHER): Payer: Medicare Other | Admitting: Gastroenterology

## 2013-02-03 ENCOUNTER — Other Ambulatory Visit (INDEPENDENT_AMBULATORY_CARE_PROVIDER_SITE_OTHER): Payer: Medicare Other

## 2013-02-03 VITALS — BP 100/60 | HR 88 | Ht 65.75 in | Wt 148.1 lb

## 2013-02-03 DIAGNOSIS — Z980 Intestinal bypass and anastomosis status: Secondary | ICD-10-CM

## 2013-02-03 DIAGNOSIS — D649 Anemia, unspecified: Secondary | ICD-10-CM

## 2013-02-03 DIAGNOSIS — R197 Diarrhea, unspecified: Secondary | ICD-10-CM

## 2013-02-03 DIAGNOSIS — K6389 Other specified diseases of intestine: Secondary | ICD-10-CM

## 2013-02-03 DIAGNOSIS — R6889 Other general symptoms and signs: Secondary | ICD-10-CM

## 2013-02-03 DIAGNOSIS — Z9889 Other specified postprocedural states: Secondary | ICD-10-CM

## 2013-02-03 LAB — CBC WITH DIFFERENTIAL/PLATELET
Basophils Absolute: 0 10*3/uL (ref 0.0–0.1)
Eosinophils Absolute: 0.1 10*3/uL (ref 0.0–0.7)
Lymphocytes Relative: 31.1 % (ref 12.0–46.0)
MCHC: 34.1 g/dL (ref 30.0–36.0)
Neutrophils Relative %: 56.3 % (ref 43.0–77.0)
RDW: 13.7 % (ref 11.5–14.6)

## 2013-02-03 LAB — FOLATE: Folate: 13.3 ng/mL (ref >5.9)

## 2013-02-03 LAB — MAGNESIUM: Magnesium: 2 mg/dL (ref 1.5–2.5)

## 2013-02-03 LAB — VITAMIN B12: Vitamin B-12: 388 pg/mL (ref 211–911)

## 2013-02-03 MED ORDER — RIFAXIMIN 550 MG PO TABS: 550.0000 mg | ORAL_TABLET | Freq: Two times a day (BID) | ORAL | Status: DC

## 2013-02-03 MED ORDER — ALIGN PO CAPS: 1.0000 | ORAL_CAPSULE | Freq: Every day | ORAL | Status: DC

## 2013-02-03 NOTE — Addendum Note (Signed)
Addended by: Ok Anis A on: 02/03/2013 09:37 AM   Modules accepted: Orders

## 2013-02-03 NOTE — Patient Instructions (Signed)
  We have given you samples of Align. This puts good bacteria back into your colon. You should take 1 capsule by mouth once daily. If this works well for you, it can be purchased over the counter.  We have given you samples of the following medication to take: Xifaxan, please take one tablet by mouth twice daily. Take until samples are gone.  Your physician has requested that you go to the basement for the following lab work before leaving today: Anemia Panel CBC Magnesium Celiac Panel Stool Elastase   Please call Dr. Norval Gable nurse Aram Beecham when you finished the Xifaxan and give a report on how you are feeling. ______________________________________________________________________________________________________________________________                                               We are excited to introduce MyChart, a new best-in-class service that provides you online access to important information in your electronic medical record. We want to make it easier for you to view your health information - all in one secure location - when and where you need it. We expect MyChart will enhance the quality of care and service we provide.  When you register for MyChart, you can:    View your test results.    Request appointments and receive appointment reminders via email.    Request medication renewals.    View your medical history, allergies, medications and immunizations.    Communicate with your physician's office through a password-protected site.    Conveniently print information such as your medication lists.  To find out if MyChart is right for you, please talk to a member of our clinical staff today. We will gladly answer your questions about this free health and wellness tool.  If you are age 69 or older and want a member of your family to have access to your record, you must provide written consent by completing a proxy form available at our office. Please speak to our  clinical staff about guidelines regarding accounts for patients younger than age 70.  As you activate your MyChart account and need any technical assistance, please call the MyChart technical support line at (336) 83-CHART [phone]) or email your question to [email or web address]. If you email your question(s), please include your name, a return phone number and the best time to reach you.  If you have non-urgent health-related questions, you can send a message to our office through MyChart at [email or web address]. If you have a medical emergency, call 911.  Thank you for using MyChart as your new health and wellness resource!   MyChart licensed from [employer],  [id number]. Patents Pending.

## 2013-02-03 NOTE — Progress Notes (Signed)
History of Present Illness:  This is a 67 year old [name] American who does not speak [place] and with his interpreter today.  He says 7 months of weight diarrhea with gas and bloating but no abdominal pain, rectal bleeding, upper GI or hepatobiliary complaints or any specific food intolerances.  He does have some lactose intolerance.  Despite these complaints is been no anorexia or weight loss.  He is up-to-date on his endoscopy and colonoscopy.  He is status post subtotal gastrectomy with Billroth II gastroenterostomy from previous peptic ulcer disease with last procedures in November of 2011.  He denies rectal bleeding, or any symptoms of malnutrition or anemia.  Recent labs showed normal metabolic profile.  B12 level was normal.  Family history is noncontributory.  He denies abuse of alcohol, cigarettes, or NSAIDs, and there is no history of pancreatitis or hepatitis.   I have reviewed this patient's present history, medical and surgical past history, allergies and medications.     ROS:   All systems were reviewed and are negative unless otherwise stated in the HPI.    Physical Exam: At pressure 100/60, pulse 88, and weight 148 with a BMI of 24.09. General well developed well nourished patient in no acute distress, appearing their stated age Eyes PERRLA, no icterus, fundoscopic exam per opthamologist Skin no lesions noted Neck supple, no adenopathy, no thyroid enlargement, no tenderness Chest clear to percussion and auscultation Heart no significant murmurs, gallops or rubs noted Abdomen no hepatosplenomegaly masses or tenderness, BS normal.  Rectal inspection normal no fissures, or fistulae noted.  No masses or tenderness on digital exam. Stool guaiac negative. Extremities no acute joint lesions, edema, phlebitis or evidence of cellulitis. Neurologic patient oriented x 3, cranial nerves intact, no focal neurologic deficits noted. Psychological mental status normal and normal  affect.  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  I have ordered CBC, anemia profile, celiac panel, and stool elastase-1 to assess pancreatic function.  I have placed him on Xifaxan 550 mg twice a day for 2 weeks with daily probiotics.  He is to call in 2 weeks' time for progress report.  Otherwise his continue medications as listed and reviewed.  He should avoid major lactose products. Encounter Diagnoses  Name Primary?  . Diarrhea Yes  . Anemia   . Abnormal laboratory test   . Other general symptoms(780.99)

## 2013-02-04 ENCOUNTER — Other Ambulatory Visit: Payer: Medicare Other

## 2013-02-04 DIAGNOSIS — R197 Diarrhea, unspecified: Secondary | ICD-10-CM

## 2013-02-04 DIAGNOSIS — D649 Anemia, unspecified: Secondary | ICD-10-CM

## 2013-02-04 DIAGNOSIS — R6889 Other general symptoms and signs: Secondary | ICD-10-CM

## 2013-02-04 LAB — CELIAC PANEL 10
Gliadin IgA: 6.6 U/mL (ref <20)
Gliadin IgG: 3.8 U/mL (ref <20)
IgA: 306 mg/dL (ref 68–379)
Tissue Transglutaminase Ab, IgA: 9.9 U/mL (ref <20)

## 2013-02-11 LAB — PANCREATIC ELASTASE, FECAL: Pancreatic Elastase-1, Stool: >500 mcg/g

## 2013-02-13 ENCOUNTER — Telehealth: Payer: Self-pay | Admitting: Gastroenterology

## 2013-02-13 NOTE — Telephone Encounter (Signed)
Pt's granddaughter, Christella Scheuermann states pt is still not feeling well. He is having frequent stools , feels as though his stomach is inflamed and can't eat very much d/t his stomach burning. Pt had 12 days worth of Xifaxan, but it did not make much difference. Will call back Monday because there is a question on # of days of therapy.

## 2013-02-16 NOTE — Telephone Encounter (Signed)
Pt only had 2 weeks of therapy on Xifaxan, but still having problems; please advise. Thanks.

## 2013-02-16 NOTE — Telephone Encounter (Signed)
Try septra ds bid for 10 days and VSL#3 bid

## 2013-02-19 ENCOUNTER — Telehealth: Payer: Self-pay | Admitting: Gastroenterology

## 2013-02-19 MED ORDER — SULFAMETHOXAZOLE-TRIMETHOPRIM 800-160 MG PO TABS: ORAL_TABLET | ORAL | Status: DC

## 2013-02-19 MED ORDER — VSL#3 PO CAPS: 1.0000 | ORAL_CAPSULE | Freq: Two times a day (BID) | ORAL | Status: DC

## 2013-02-19 NOTE — Telephone Encounter (Signed)
Mardella Layman, MD at 02/16/2013 9:34 AM   Status: Signed            Try septra ds bid for 10 days and VSL#3 bid   Informed Granddaughter of the new AB and to pick up the VSL from [place]. She reports no can come today and I informed her we are closed tomorrow. She will pick up the VSL on 02/23/13.

## 2013-02-23 ENCOUNTER — Telehealth: Payer: Self-pay | Admitting: *Deleted

## 2013-02-23 NOTE — Telephone Encounter (Signed)
Patient came and pick up the VSL #3 today

## 2013-03-18 ENCOUNTER — Telehealth: Payer: Self-pay | Admitting: *Deleted

## 2013-03-18 ENCOUNTER — Ambulatory Visit (INDEPENDENT_AMBULATORY_CARE_PROVIDER_SITE_OTHER): Payer: Medicare Other | Admitting: Gastroenterology

## 2013-03-18 ENCOUNTER — Encounter: Payer: Self-pay | Admitting: Gastroenterology

## 2013-03-18 VITALS — BP 110/70 | HR 84 | Ht 65.75 in | Wt 152.0 lb

## 2013-03-18 DIAGNOSIS — Z9889 Other specified postprocedural states: Secondary | ICD-10-CM

## 2013-03-18 DIAGNOSIS — K902 Blind loop syndrome, not elsewhere classified: Secondary | ICD-10-CM

## 2013-03-18 DIAGNOSIS — R1013 Epigastric pain: Secondary | ICD-10-CM

## 2013-03-18 DIAGNOSIS — K2960 Other gastritis without bleeding: Secondary | ICD-10-CM

## 2013-03-18 DIAGNOSIS — Z980 Intestinal bypass and anastomosis status: Secondary | ICD-10-CM

## 2013-03-18 DIAGNOSIS — G8929 Other chronic pain: Secondary | ICD-10-CM

## 2013-03-18 DIAGNOSIS — J438 Other emphysema: Secondary | ICD-10-CM

## 2013-03-18 DIAGNOSIS — K912 Postsurgical malabsorption, not elsewhere classified: Secondary | ICD-10-CM

## 2013-03-18 MED ORDER — SUCRALFATE 1 GM/10ML PO SUSP: ORAL | Status: DC

## 2013-03-18 NOTE — Progress Notes (Signed)
This is a 67 year old Cuban-American male who is been in this country for many years but does not speak [place].  He is accompanied by an interpreter today.  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  Last endoscopy in September 2011 showed a partial gastrectomy with Billroth II gastroenterostomy and alkaline reflux gastritis.  Colonoscopy showed diverticulosis but otherwise was normal.  He had previous endoscopy/colonoscopy in 2003 by Dr. Danise Edge.  The patient been bothered by constant abdominal pain for many years without any r definite etiology being determined other than post gastrectomy nonspecific abdominal pain.Marland Kitchen  He had negative CT scan of the abdomen in 2007 in 2012.  He had  2 CT angiograms of his heart which are been unremarkable, but have shown some lung changes of emphysem, and he is a chronic heavy smoker.  Despite  these complaints, he's had no anorexia, weight loss, melena, hematochezia, or specific hepatobiliary or systemic complaints.  His main complaint today is diffuse abdominal pain without any precipitating or alleviating elements.  Also complains of continued gas, bloating, and loose stooling.  Denies a specific food intolerances or other specific GI or hepatobiliary issues.  Review of his record shows that he is been on antibiotics for several other medical problems without any improvement in his GI symptoms.  He also has been on chronic probiotic therapy.  He has not had previous cholecystectomy.  Current Medications, Allergies, Past Medical History, Past Surgical History, Family History and Social History were reviewed in [employer] record.  ROS: All systems were reviewed and are negative unless otherwise stated in the HPI.   No Known Allergies Outpatient Prescriptions Prior to Visit  Medication Sig Dispense Refill  . lisinopril (PRINIVIL,ZESTRIL) 20 MG tablet Take 20 mg by mouth at bedtime.      . Tamsulosin HCl (FLOMAX) 0.4 MG CAPS Take 0.4 mg by mouth daily.      Marland Kitchen albuterol (PROVENTIL HFA;VENTOLIN HFA) 108 (90 BASE) MCG/ACT inhaler Inhale 3 puffs into the lungs every 6 (six) hours as needed. As needed for shortness of breath.      Marland Kitchen amoxicillin (AMOXIL) 500 MG capsule Take 500 mg by mouth 3 (three) times daily. For 7 days for tooth removal. Last dose of course was 09/03/11 am.      . bifidobacterium infantis (ALIGN) capsule Take 1 capsule by mouth daily.  14 capsule  0  . finasteride (PROSCAR) 5 MG tablet Take 5 mg by mouth daily.      Marland Kitchen losartan (COZAAR) 50 MG tablet Take 50 mg by mouth daily.      . Probiotic Product (VSL#3) CAPS Take 1 capsule by mouth 2 (two) times daily.  28 capsule  0  . ranitidine (ZANTAC) 150 MG tablet Take 150 mg by mouth daily.      . rifaximin (XIFAXAN) 550 MG TABS Take 1 tablet (550 mg total) by mouth 2 (two) times daily.  54 tablet  0  . sulfamethoxazole-trimethoprim (BACTRIM DS) 800-160 MG per tablet Take one tablet by mouth twice daily for 10 days.  20 tablet  0   No facility-administered medications prior to visit.   Past Medical History  Diagnosis Date  .  Hypertension   . Diverticulosis of colon (without mention of hemorrhage)   . Anemia   . GERD (gastroesophageal reflux disease)    Past Surgical History  Procedure Laterality Date  . Partial gastrectomy     History   Social History  . Marital Status: Married    Spouse Name: N/A    Number of Children: N/A  . Years of Education: N/A   Social History Main Topics  . Smoking status: Former Games developer  . Smokeless tobacco: Never Used  . Alcohol Use: No  . Drug Use: No  . Sexually Active: None   Other  Topics Concern  . None   Social History Narrative  . None   Family History  Problem Relation Age of Onset  . Uterine cancer Mother   . Liver cancer Father   . Colon cancer Neg Hx   . Esophageal cancer Neg Hx   . Diabetes Mother   . Heart disease Mother   . Kidney disease Neg Hx             Physical Exam: Blood pressure 110/70, pulse 84 and regular, and weight 152 with a BMI of 24.72.  Patient is a former smoker.  His abdomen shows slight distention but no organomegaly, masses, localized tenderness.  Bowel sounds are very active in nature.  Rectal exam was deferred.  Cannot appreciate stigmata of chronic liver disease.  No status is normal    Assessment and Plan: This patient is very difficult to treat in that he has had no response to therapy for the last 10 years.  He is been treated for bacterial overgrowth syndrome on numerous occasions, alkaline gastritis, and continues with chronic abdominal pain.  He currently is on Zantac, and I have added Carafate 1 g after meals and at bedtime to see if this has any effect on his symptoms.  At this point, I think he needs a second opinion from a tertiary Medical Center, and I've referred him to Dr. Redgie Grayer at [facility] in [place] for review.  He is post gastrectomy syndrome manifested mostly by chronic abdominal pain.  Whether or not he would benefit from a Roux-en-Y biliary diversion is questionable.  I look forward to Dr. Hulan Fess evaluation and opinion.

## 2013-03-18 NOTE — Patient Instructions (Signed)
We have sent the following medications to your pharmacy for you to pick up at your convenience: Carafate, please take 20 mintues after meals and at bedtime  You will be referred to Dr. Merri Brunette at [facility]

## 2013-03-18 NOTE — Addendum Note (Signed)
Addended by: Ok Anis A on: 03/18/2013 02:44 PM   Modules accepted: Orders

## 2013-03-18 NOTE — Telephone Encounter (Signed)
I spoke with Lorayne Marek at Dr. Hulan Fess office  Patient is scheduled for June 08, 2013 at 845 am and put on waiting list for sooner appointment  Per Lorayne Marek patient will get packet in the mail for appointment info and she scheduled an interpretor  I faxed records

## 2013-06-08 DIAGNOSIS — R109 Unspecified abdominal pain: Secondary | ICD-10-CM | POA: Insufficient documentation

## 2013-12-16 ENCOUNTER — Telehealth (HOSPITAL_COMMUNITY): Payer: Self-pay

## 2013-12-16 NOTE — Telephone Encounter (Signed)
This patient is overdue for recommended follow-up with a bariatric surgeon at [facility]. Call attempted today to reestablish post-op care with [facility], but unable to reach patient by phone.  A letter will be mailed to the patient today to the address on file from [facility] advising the patient on the benefits of follow-up care and directing them to call [facility] at [phone] to schedule an appointment at their earliest convenience.  ° °Amanda T. Fleming °Bariatric Office Coordinator °[phone] ° °

## 2014-03-20 ENCOUNTER — Emergency Department (HOSPITAL_COMMUNITY)
Admission: EM | Admit: 2014-03-20 | Discharge: 2014-03-20 | Disposition: A | Discharge status: Home / Self Care | Payer: Medicare Other | Attending: Emergency Medicine | Admitting: Emergency Medicine

## 2014-03-20 ENCOUNTER — Emergency Department (HOSPITAL_COMMUNITY): Payer: Medicare Other

## 2014-03-20 ENCOUNTER — Encounter (HOSPITAL_COMMUNITY): Payer: Self-pay | Admitting: Emergency Medicine

## 2014-03-20 DIAGNOSIS — I10 Essential (primary) hypertension: Secondary | ICD-10-CM | POA: Insufficient documentation

## 2014-03-20 DIAGNOSIS — Z862 Personal history of diseases of the blood and blood-forming organs and certain disorders involving the immune mechanism: Secondary | ICD-10-CM | POA: Diagnosis not present

## 2014-03-20 DIAGNOSIS — R5383 Other fatigue: Secondary | ICD-10-CM | POA: Diagnosis present

## 2014-03-20 DIAGNOSIS — R5381 Other malaise: Secondary | ICD-10-CM | POA: Diagnosis present

## 2014-03-20 DIAGNOSIS — R42 Dizziness and giddiness: Secondary | ICD-10-CM | POA: Diagnosis not present

## 2014-03-20 DIAGNOSIS — R110 Nausea: Secondary | ICD-10-CM | POA: Diagnosis not present

## 2014-03-20 DIAGNOSIS — Z79899 Other long term (current) drug therapy: Secondary | ICD-10-CM | POA: Insufficient documentation

## 2014-03-20 DIAGNOSIS — Z87891 Personal history of nicotine dependence: Secondary | ICD-10-CM | POA: Insufficient documentation

## 2014-03-20 DIAGNOSIS — Z8719 Personal history of other diseases of the digestive system: Secondary | ICD-10-CM | POA: Insufficient documentation

## 2014-03-20 DIAGNOSIS — Z791 Long term (current) use of non-steroidal anti-inflammatories (NSAID): Secondary | ICD-10-CM | POA: Diagnosis not present

## 2014-03-20 LAB — CBC WITH DIFFERENTIAL/PLATELET
Basophils Absolute: 0 10*3/uL (ref 0.0–0.1)
Basophils Relative: 0 % (ref 0–1)
Eosinophils Absolute: 0.1 10*3/uL (ref 0.0–0.7)
Eosinophils Relative: 2 % (ref 0–5)
HCT: 42.4 % (ref 39.0–52.0)
HEMOGLOBIN: 14.8 g/dL (ref 13.0–17.0)
LYMPHS ABS: 1.7 10*3/uL (ref 0.7–4.0)
LYMPHS PCT: 21 % (ref 12–46)
MCH: 30.7 pg (ref 26.0–34.0)
MCHC: 34.9 g/dL (ref 30.0–36.0)
MCV: 88 fL (ref 78.0–100.0)
MONOS PCT: 10 % (ref 3–12)
Monocytes Absolute: 0.8 10*3/uL (ref 0.1–1.0)
NEUTROS PCT: 67 % (ref 43–77)
Neutro Abs: 5.4 10*3/uL (ref 1.7–7.7)
PLATELETS: 223 10*3/uL (ref 150–400)
RBC: 4.82 MIL/uL (ref 4.22–5.81)
RDW: 12.7 % (ref 11.5–15.5)
WBC: 8 10*3/uL (ref 4.0–10.5)

## 2014-03-20 LAB — I-STAT TROPONIN, ED: TROPONIN I, POC: 0 ng/mL (ref 0.00–0.08)

## 2014-03-20 LAB — BASIC METABOLIC PANEL
Anion gap: 16 — ABNORMAL HIGH (ref 5–15)
BUN: 25 mg/dL — ABNORMAL HIGH (ref 6–23)
CHLORIDE: 93 meq/L — AB (ref 96–112)
CO2: 23 mEq/L (ref 19–32)
Calcium: 8.9 mg/dL (ref 8.4–10.5)
Creatinine, Ser: 1.25 mg/dL (ref 0.50–1.35)
GFR, EST AFRICAN AMERICAN: 66 mL/min — AB (ref >90)
GFR, EST NON AFRICAN AMERICAN: 57 mL/min — AB (ref >90)
GLUCOSE: 125 mg/dL — AB (ref 70–99)
POTASSIUM: 4.6 meq/L (ref 3.7–5.3)
SODIUM: 132 meq/L — AB (ref 137–147)

## 2014-03-20 MED ORDER — SODIUM CHLORIDE 0.9 % IV SOLN
Freq: Once | INTRAVENOUS | Status: AC
Start: 2014-03-20 — End: 2014-03-20
  Administered 2014-03-20: 18:00:00 via INTRAVENOUS

## 2014-03-20 MED ORDER — MECLIZINE HCL 25 MG PO TABS
25.0000 mg | ORAL_TABLET | Freq: Once | ORAL | Status: AC
  Administered 2014-03-20: 25 mg via ORAL
  Filled 2014-03-20: qty 1

## 2014-03-20 MED ORDER — ONDANSETRON 4 MG PO TBDP: 4.0000 mg | ORAL_TABLET | Freq: Three times a day (TID) | ORAL | Status: DC | PRN

## 2014-03-20 MED ORDER — MECLIZINE HCL 50 MG PO TABS: 50.0000 mg | ORAL_TABLET | Freq: Three times a day (TID) | ORAL | Status: DC | PRN

## 2014-03-20 MED ORDER — DIPHENHYDRAMINE HCL 50 MG/ML IJ SOLN
25.0000 mg | Freq: Once | INTRAMUSCULAR | Status: AC
  Administered 2014-03-20: 25 mg via INTRAVENOUS
  Filled 2014-03-20: qty 1

## 2014-03-20 MED ORDER — SODIUM CHLORIDE 0.9 % IV BOLUS (SEPSIS)
1000.0000 mL | Freq: Once | INTRAVENOUS | Status: AC
  Administered 2014-03-20: 1000 mL via INTRAVENOUS

## 2014-03-20 MED ORDER — ONDANSETRON HCL 4 MG/2ML IJ SOLN
4.0000 mg | Freq: Once | INTRAMUSCULAR | Status: AC
  Administered 2014-03-20: 4 mg via INTRAVENOUS
  Filled 2014-03-20: qty 2

## 2014-03-20 NOTE — Discharge Instructions (Signed)
Vrtigo postural benigno (Benign Positional Vertigo)  Vrtigo es la sensacin de que el entorno se mueve estando quieto. Es la forma ms frecuente de vrtigo. Benigno significa que la causa del trastorno no es grave. Es ms frecuente en adultos mayores. CAUSAS  Es el resultado de un trastorno en el sistema laberntico. Es una zona en el odo medio que ayuda a controlar el equilibrio. La causa puede ser una infeccin viral, una lesin en la cabeza o un movimiento repetitivo. Sin embargo, a menudo no se Research scientist (life sciences).  SNTOMAS  Los sntomas de vrtigo posicional benigno se producen al mover la cabeza o los ojos en diferentes direcciones. Algunos de los sntomas pueden ser:   Prdida de equilibrio y cadas.  Vmitos.  Visin borrosa.  Mareos.  Nuseas.  Movimientos oculares involuntarios (nistagmus). DIAGNSTICO  El vrtigo postural benigno se diagnostica mediante un examen fsico. Si la causa especfica de su vrtigo posicional benigno es desconocido, su mdico puede indicar diagnsticos por imgenes, como la Health visitor (RM) o la tomografa computada (TC).  TRATAMIENTO  El [employer] podr recomendar movimientos o procedimientos para corregir el vrtigo posicional benigno. Para tratar los sntomas pueden indicarse medicamentos como meclizina, benzodiazepinas y medicamentos para las nuseas. En casos raros, si los sntomas son causados   por ciertos trastornos que afectan el odo interno, es posible que necesite [name].  [place] indicaciones del mdico.  Muvase lentamente. No haga movimientos bruscos con la cabeza ni el cuerpo.  Evite conducir vehculos.  Evite operar maquinarias pesadas.  Evite realizar tareas que seran peligrosas para usted u otras personas durante un episodio de vrtigo.  Debe ingerir gran cantidad de lquido para mantener la orina de tono claro o color amarillo plido. SOLICITE ATENCIN [place] DE INMEDIATO  SI:   Tiene dificultad para hablar, caminar, siente debilidad o tiene problemas para usar los brazos, las [place] piernas.  Tiene dificultad para respirar.  Sufre un dolor de cabeza intenso.  Las nuseas o los vmitos persisten o [place].  Tiene cambios en la visin.  Sus familiares o amigos notan cambios en su conducta.  El dolor [place].  Tiene fiebre.  Comienza a sentir rigidez en el cuello o sensibilidad a la luz. ASEGRESE DE QUE:   Comprende estas instrucciones.  Controlar su enfermedad.  Solicitar ayuda de inmediato si no mejora o si empeora. Document Released: 11/22/2008 Document Revised: 10/29/2011 [facility] Patient Information 2015 [place]. This information is not intended to replace advice given to you by your health care provider. Make sure you discuss any questions you have with your health care provider.

## 2014-03-20 NOTE — ED Notes (Addendum)
Pt. C/o dizziness for the past couple days. States last night he passed out and today had a similar episode. States "room spinning" feeling goes away when he lies down. Denies CP. Reports HA, has a hx of same. Alert and oriented x4. Has generalized weakness, bilaterally.

## 2014-03-20 NOTE — ED Notes (Signed)
Pt in c/o weakness and frequent falls at home, pt worried his BP was low, HR noted to be 130 in triage, denies pain, denies other symptoms

## 2014-03-20 NOTE — ED Provider Notes (Signed)
CSN: [id number]     Arrival date & time 03/20/14  1642 History   First MD Initiated Contact with Patient 03/20/14 1653     Chief Complaint  Patient presents with  . Weakness      HPI  Patient presents with falls. He's had episodes today where he stands, and feels like things are spinning. He has ended up on the ground. This was actually last night. Did not hurt himself. Had a similar episode in the shower today. Last hours when he stands or moves he feels like things are spinning and he is somewhat nauseated.  Past Medical History  Diagnosis Date  . Hypertension   . Diverticulosis of colon (without mention of hemorrhage)   . Anemia   . GERD (gastroesophageal reflux disease)    Past Surgical History  Procedure Laterality Date  . Partial gastrectomy     Family History  Problem Relation Age of Onset  . Uterine cancer Mother   . Liver cancer Father   . Colon cancer Neg Hx   . Esophageal cancer Neg Hx   . Diabetes Mother   . Heart disease Mother   . Kidney disease Neg Hx    History  Substance Use Topics  . Smoking status: Former Research scientist (life sciences)  . Smokeless tobacco: Never Used  . Alcohol Use: No    Review of Systems  Constitutional: Negative for fever, chills, diaphoresis, appetite change and fatigue.  HENT: Negative for mouth sores, sore throat and trouble swallowing.   Eyes: Negative for visual disturbance.  Respiratory: Negative for cough, chest tightness, shortness of breath and wheezing.   Cardiovascular: Negative for chest pain.  Gastrointestinal: Positive for nausea. Negative for vomiting, abdominal pain, diarrhea and abdominal distention.  Endocrine: Negative for polydipsia, polyphagia and polyuria.  Genitourinary: Negative for dysuria, frequency and hematuria.  Musculoskeletal: Negative for gait problem.  Skin: Negative for color change, pallor and rash.  Neurological: Positive for dizziness and weakness. Negative for syncope, light-headedness and headaches.   Hematological: Does not bruise/bleed easily.  Psychiatric/Behavioral: Negative for behavioral problems and confusion.      Allergies  Review of patient's allergies indicates no known allergies.  Home Medications   Prior to Admission medications   Medication Sig Start Date End Date Taking? Authorizing Provider  lisinopril-hydrochlorothiazide (PRINZIDE,ZESTORETIC) 20-12.5 MG per tablet Take 1 tablet by mouth daily.   Yes Historical Provider, MD  nortriptyline (PAMELOR) 10 MG capsule Take 10 mg by mouth at bedtime.   Yes Historical Provider, MD  Tamsulosin HCl (FLOMAX) 0.4 MG CAPS Take 0.4 mg by mouth at bedtime.    Yes Historical Provider, MD  traMADol (ULTRAM) 50 MG tablet Take 50 mg by mouth 3 (three) times daily. scheduled   Yes Historical Provider, MD  meclizine (ANTIVERT) 50 MG tablet Take 1 tablet (50 mg total) by mouth 3 (three) times daily as needed. 03/20/14   Tanna Furry, MD  ondansetron (ZOFRAN ODT) 4 MG disintegrating tablet Take 1 tablet (4 mg total) by mouth every 8 (eight) hours as needed for nausea. 03/20/14   Tanna Furry, MD   BP 105/72  Pulse 129  Temp(Src) 98.5 F (36.9 C) (Oral)  Resp 20  SpO2 95% Physical Exam  Constitutional: He is oriented to person, place, and time. He appears well-developed and well-nourished. No distress.  HENT:  Head: Normocephalic.  Eyes: Conjunctivae are normal. Pupils are equal, round, and reactive to light. No scleral icterus.  Neck: Normal range of motion. Neck supple. No thyromegaly present.  Cardiovascular:  Normal rate and regular rhythm.  Exam reveals no gallop and no friction rub.   No murmur heard. Pulmonary/Chest: Effort normal and breath sounds normal. No respiratory distress. He has no wheezes. He has no rales.  Abdominal: Soft. Bowel sounds are normal. He exhibits no distension. There is no tenderness. There is no rebound.  Musculoskeletal: Normal range of motion.  Neurological: He is alert and oriented to person, place, and  time.  No nystagmus. No peripheral weakness. Symptomatic with standing. Asymptomatic supine.   Skin: Skin is warm and dry. No rash noted.  Psychiatric: He has a normal mood and affect. His behavior is normal.    ED Course  Procedures (including critical care time) Labs Review Labs Reviewed  BASIC METABOLIC PANEL - Abnormal; Notable for the following:    Sodium 132 (*)    Chloride 93 (*)    Glucose, Bld 125 (*)    BUN 25 (*)    GFR calc non Af Amer 57 (*)    GFR calc Af Amer 66 (*)    Anion gap 16 (*)    All other components within normal limits  CBC WITH DIFFERENTIAL  I-STAT TROPOININ, ED    Imaging Review Ct Head Wo Contrast  03/20/2014   CLINICAL DATA:  Frontal headaches  EXAM: CT HEAD WITHOUT CONTRAST  TECHNIQUE: Contiguous axial images were obtained from the base of the skull through the vertex without intravenous contrast.  COMPARISON:  None.  FINDINGS: The bony calvarium is intact. No findings to suggest acute hemorrhage, acute infarction or space-occupying mass lesion are noted. There chronic white matter ischemic changes identified bilaterally.  IMPRESSION: Chronic changes without acute abnormality.   Electronically Signed   By: Inez Catalina M.D.   On: 03/20/2014 17:51     EKG Interpretation None      MDM   Final diagnoses:  Vertigo    Pt asymptomatic after benadryl, antivert, zofran, and IV fluids.  Ct without acute changes.  Pt standing, ambulating without difficulties.    Tanna Furry, MD 03/20/14 [phone]

## 2014-03-20 NOTE — ED Notes (Signed)
Pt. Refused wheelchair out.

## 2014-10-25 DIAGNOSIS — Z719 Counseling, unspecified: Secondary | ICD-10-CM | POA: Diagnosis not present

## 2014-10-25 DIAGNOSIS — Z13220 Encounter for screening for lipoid disorders: Secondary | ICD-10-CM | POA: Diagnosis not present

## 2014-10-25 DIAGNOSIS — I10 Essential (primary) hypertension: Secondary | ICD-10-CM | POA: Diagnosis not present

## 2014-11-17 DIAGNOSIS — K259 Gastric ulcer, unspecified as acute or chronic, without hemorrhage or perforation: Secondary | ICD-10-CM | POA: Diagnosis not present

## 2014-11-17 DIAGNOSIS — M549 Dorsalgia, unspecified: Secondary | ICD-10-CM | POA: Diagnosis not present

## 2014-11-17 DIAGNOSIS — I10 Essential (primary) hypertension: Secondary | ICD-10-CM | POA: Diagnosis not present

## 2014-12-23 ENCOUNTER — Other Ambulatory Visit (HOSPITAL_COMMUNITY): Payer: Self-pay | Admitting: Specialist

## 2014-12-23 ENCOUNTER — Ambulatory Visit (HOSPITAL_COMMUNITY)
Admission: RE | Admit: 2014-12-23 | Discharge: 2014-12-23 | Disposition: A | Discharge status: Home / Self Care | Payer: Medicare Other | Source: Ambulatory Visit | Attending: Surgery | Admitting: Surgery

## 2014-12-23 DIAGNOSIS — M7989 Other specified soft tissue disorders: Secondary | ICD-10-CM | POA: Insufficient documentation

## 2014-12-23 DIAGNOSIS — M79609 Pain in unspecified limb: Secondary | ICD-10-CM

## 2014-12-23 DIAGNOSIS — M79661 Pain in right lower leg: Secondary | ICD-10-CM | POA: Diagnosis not present

## 2014-12-23 DIAGNOSIS — M5416 Radiculopathy, lumbar region: Secondary | ICD-10-CM | POA: Diagnosis not present

## 2014-12-23 NOTE — Progress Notes (Signed)
*  PRELIMINARY RESULTS* Vascular Ultrasound Lower extremity venous duplex has been completed.  Preliminary findings: negative for DVT and baker's cyst.  Landry Mellow, RDMS, RVT  12/23/2014, 11:52 AM

## 2015-02-16 ENCOUNTER — Other Ambulatory Visit: Payer: Self-pay | Admitting: Specialist

## 2015-02-16 DIAGNOSIS — M545 Low back pain: Secondary | ICD-10-CM

## 2015-03-09 ENCOUNTER — Ambulatory Visit
Admission: RE | Admit: 2015-03-09 | Discharge: 2015-03-09 | Disposition: A | Discharge status: Home / Self Care | Payer: Medicare Other | Source: Ambulatory Visit | Attending: Specialist | Admitting: Specialist

## 2015-03-09 DIAGNOSIS — M5117 Intervertebral disc disorders with radiculopathy, lumbosacral region: Secondary | ICD-10-CM | POA: Diagnosis not present

## 2015-03-09 DIAGNOSIS — M4727 Other spondylosis with radiculopathy, lumbosacral region: Secondary | ICD-10-CM | POA: Diagnosis not present

## 2015-03-09 DIAGNOSIS — M545 Low back pain: Secondary | ICD-10-CM

## 2015-03-09 DIAGNOSIS — M4317 Spondylolisthesis, lumbosacral region: Secondary | ICD-10-CM | POA: Diagnosis not present

## 2015-05-03 DIAGNOSIS — M47817 Spondylosis without myelopathy or radiculopathy, lumbosacral region: Secondary | ICD-10-CM | POA: Diagnosis not present

## 2015-05-03 DIAGNOSIS — M79661 Pain in right lower leg: Secondary | ICD-10-CM | POA: Diagnosis not present

## 2015-05-03 DIAGNOSIS — M4806 Spinal stenosis, lumbar region: Secondary | ICD-10-CM | POA: Diagnosis not present

## 2015-05-03 DIAGNOSIS — M5416 Radiculopathy, lumbar region: Secondary | ICD-10-CM | POA: Diagnosis not present

## 2015-05-23 DIAGNOSIS — M47816 Spondylosis without myelopathy or radiculopathy, lumbar region: Secondary | ICD-10-CM | POA: Diagnosis not present

## 2015-06-02 DIAGNOSIS — M47817 Spondylosis without myelopathy or radiculopathy, lumbosacral region: Secondary | ICD-10-CM | POA: Diagnosis not present

## 2015-06-02 DIAGNOSIS — M47816 Spondylosis without myelopathy or radiculopathy, lumbar region: Secondary | ICD-10-CM | POA: Diagnosis not present

## 2015-06-09 DIAGNOSIS — M47816 Spondylosis without myelopathy or radiculopathy, lumbar region: Secondary | ICD-10-CM | POA: Diagnosis not present

## 2015-06-21 DIAGNOSIS — E785 Hyperlipidemia, unspecified: Secondary | ICD-10-CM | POA: Diagnosis not present

## 2015-06-21 DIAGNOSIS — Z0000 Encounter for general adult medical examination without abnormal findings: Secondary | ICD-10-CM | POA: Diagnosis not present

## 2015-06-21 DIAGNOSIS — Z0001 Encounter for general adult medical examination with abnormal findings: Secondary | ICD-10-CM | POA: Diagnosis not present

## 2015-06-21 DIAGNOSIS — Z833 Family history of diabetes mellitus: Secondary | ICD-10-CM | POA: Diagnosis not present

## 2015-06-21 DIAGNOSIS — I10 Essential (primary) hypertension: Secondary | ICD-10-CM | POA: Diagnosis not present

## 2015-09-12 DIAGNOSIS — E119 Type 2 diabetes mellitus without complications: Secondary | ICD-10-CM | POA: Diagnosis not present

## 2015-09-12 DIAGNOSIS — R5383 Other fatigue: Secondary | ICD-10-CM | POA: Diagnosis not present

## 2015-09-12 DIAGNOSIS — R5382 Chronic fatigue, unspecified: Secondary | ICD-10-CM | POA: Diagnosis not present

## 2015-09-12 DIAGNOSIS — L209 Atopic dermatitis, unspecified: Secondary | ICD-10-CM | POA: Diagnosis not present

## 2015-09-12 DIAGNOSIS — I10 Essential (primary) hypertension: Secondary | ICD-10-CM | POA: Diagnosis not present

## 2015-09-15 DIAGNOSIS — Z79899 Other long term (current) drug therapy: Secondary | ICD-10-CM | POA: Diagnosis not present

## 2015-10-06 DIAGNOSIS — M47817 Spondylosis without myelopathy or radiculopathy, lumbosacral region: Secondary | ICD-10-CM | POA: Diagnosis not present

## 2015-10-06 DIAGNOSIS — M47816 Spondylosis without myelopathy or radiculopathy, lumbar region: Secondary | ICD-10-CM | POA: Diagnosis not present

## 2015-10-25 DIAGNOSIS — M47816 Spondylosis without myelopathy or radiculopathy, lumbar region: Secondary | ICD-10-CM | POA: Diagnosis not present

## 2015-11-10 DIAGNOSIS — M47817 Spondylosis without myelopathy or radiculopathy, lumbosacral region: Secondary | ICD-10-CM | POA: Diagnosis not present

## 2015-11-10 DIAGNOSIS — M47816 Spondylosis without myelopathy or radiculopathy, lumbar region: Secondary | ICD-10-CM | POA: Diagnosis not present

## 2015-11-10 DIAGNOSIS — M4806 Spinal stenosis, lumbar region: Secondary | ICD-10-CM | POA: Diagnosis not present

## 2015-11-10 DIAGNOSIS — M5416 Radiculopathy, lumbar region: Secondary | ICD-10-CM | POA: Diagnosis not present

## 2015-12-12 ENCOUNTER — Encounter: Payer: Self-pay | Admitting: Gastroenterology

## 2015-12-12 DIAGNOSIS — M4806 Spinal stenosis, lumbar region: Secondary | ICD-10-CM | POA: Diagnosis not present

## 2015-12-12 DIAGNOSIS — M47816 Spondylosis without myelopathy or radiculopathy, lumbar region: Secondary | ICD-10-CM | POA: Diagnosis not present

## 2015-12-12 DIAGNOSIS — M47817 Spondylosis without myelopathy or radiculopathy, lumbosacral region: Secondary | ICD-10-CM | POA: Diagnosis not present

## 2015-12-19 ENCOUNTER — Other Ambulatory Visit: Payer: Self-pay | Admitting: Specialist

## 2015-12-19 DIAGNOSIS — G8929 Other chronic pain: Secondary | ICD-10-CM

## 2015-12-19 DIAGNOSIS — M549 Dorsalgia, unspecified: Principal | ICD-10-CM

## 2016-01-03 ENCOUNTER — Ambulatory Visit
Admission: RE | Admit: 2016-01-03 | Discharge: 2016-01-03 | Disposition: A | Discharge status: Home / Self Care | Payer: Medicare Other | Source: Ambulatory Visit | Attending: Specialist | Admitting: Specialist

## 2016-01-03 DIAGNOSIS — M5126 Other intervertebral disc displacement, lumbar region: Secondary | ICD-10-CM | POA: Diagnosis not present

## 2016-01-03 DIAGNOSIS — M549 Dorsalgia, unspecified: Principal | ICD-10-CM

## 2016-01-03 DIAGNOSIS — G8929 Other chronic pain: Secondary | ICD-10-CM

## 2016-01-03 DIAGNOSIS — M5124 Other intervertebral disc displacement, thoracic region: Secondary | ICD-10-CM | POA: Diagnosis not present

## 2016-01-03 MED ORDER — ONDANSETRON HCL 4 MG/2ML IJ SOLN
4.0000 mg | Freq: Once | INTRAMUSCULAR | Status: AC
  Administered 2016-01-03: 4 mg via INTRAMUSCULAR

## 2016-01-03 MED ORDER — MEPERIDINE HCL 100 MG/ML IJ SOLN
75.0000 mg | Freq: Once | INTRAMUSCULAR | Status: AC
  Administered 2016-01-03: 75 mg via INTRAMUSCULAR

## 2016-01-03 MED ORDER — IOPAMIDOL (ISOVUE-M 300) INJECTION 61%
10.0000 mL | Freq: Once | INTRAMUSCULAR | Status: AC | PRN
  Administered 2016-01-03: 10 mL via INTRATHECAL

## 2016-01-03 MED ORDER — DIAZEPAM 5 MG PO TABS
5.0000 mg | ORAL_TABLET | Freq: Once | ORAL | Status: AC
  Administered 2016-01-03: 5 mg via ORAL

## 2016-01-03 NOTE — Progress Notes (Signed)
Patient states through interpreter that he has been off Nortriptyline and Tramadol for at least the past two days.  jkl

## 2016-01-03 NOTE — Discharge Instructions (Addendum)
Instrucciones de IT sales professional de un [place]   1. Tienes que ir a su casa a descansar para los siguiente 24 horas.  Es muy importante mantenerse acostado en unna manera plano por los siguiente 24 horas.  Solo se puede levantar para ir al ba

## 2016-01-05 DIAGNOSIS — M47817 Spondylosis without myelopathy or radiculopathy, lumbosacral region: Secondary | ICD-10-CM | POA: Diagnosis not present

## 2016-01-05 DIAGNOSIS — M4806 Spinal stenosis, lumbar region: Secondary | ICD-10-CM | POA: Diagnosis not present

## 2016-02-22 ENCOUNTER — Encounter (HOSPITAL_COMMUNITY): Payer: Self-pay

## 2016-02-22 ENCOUNTER — Encounter (HOSPITAL_COMMUNITY)
Admission: RE | Admit: 2016-02-22 | Discharge: 2016-02-22 | Disposition: A | Discharge status: Home / Self Care | Payer: Medicare Other | Source: Ambulatory Visit | Attending: Specialist | Admitting: Specialist

## 2016-02-22 ENCOUNTER — Other Ambulatory Visit: Payer: Self-pay

## 2016-02-22 ENCOUNTER — Ambulatory Visit (HOSPITAL_COMMUNITY)
Admission: RE | Admit: 2016-02-22 | Discharge: 2016-02-22 | Disposition: A | Discharge status: Home / Self Care | Payer: Medicare Other | Source: Ambulatory Visit | Attending: Surgery | Admitting: Surgery

## 2016-02-22 DIAGNOSIS — K219 Gastro-esophageal reflux disease without esophagitis: Secondary | ICD-10-CM | POA: Insufficient documentation

## 2016-02-22 DIAGNOSIS — Z7984 Long term (current) use of oral hypoglycemic drugs: Secondary | ICD-10-CM | POA: Diagnosis not present

## 2016-02-22 DIAGNOSIS — Z01810 Encounter for preprocedural cardiovascular examination: Secondary | ICD-10-CM | POA: Diagnosis not present

## 2016-02-22 DIAGNOSIS — Z79899 Other long term (current) drug therapy: Secondary | ICD-10-CM | POA: Insufficient documentation

## 2016-02-22 DIAGNOSIS — Z01818 Encounter for other preprocedural examination: Secondary | ICD-10-CM | POA: Diagnosis not present

## 2016-02-22 DIAGNOSIS — Z01812 Encounter for preprocedural laboratory examination: Secondary | ICD-10-CM | POA: Diagnosis not present

## 2016-02-22 DIAGNOSIS — J984 Other disorders of lung: Secondary | ICD-10-CM | POA: Diagnosis not present

## 2016-02-22 DIAGNOSIS — I10 Essential (primary) hypertension: Secondary | ICD-10-CM | POA: Insufficient documentation

## 2016-02-22 HISTORY — DX: Anxiety disorder, unspecified: F41.9

## 2016-02-22 HISTORY — DX: Type 2 diabetes mellitus without complications: E11.9

## 2016-02-22 LAB — TYPE AND SCREEN
ABO/RH(D): A POS
Antibody Screen: NEGATIVE

## 2016-02-22 LAB — COMPREHENSIVE METABOLIC PANEL
ALK PHOS: 51 U/L (ref 38–126)
ALT: 33 U/L (ref 17–63)
ANION GAP: 7 (ref 5–15)
AST: 32 U/L (ref 15–41)
Albumin: 4 g/dL (ref 3.5–5.0)
BUN: 19 mg/dL (ref 6–20)
CALCIUM: 9.9 mg/dL (ref 8.9–10.3)
CO2: 30 mmol/L (ref 22–32)
CREATININE: 1.02 mg/dL (ref 0.61–1.24)
Chloride: 102 mmol/L (ref 101–111)
Glucose, Bld: 107 mg/dL — ABNORMAL HIGH (ref 65–99)
Potassium: 4.8 mmol/L (ref 3.5–5.1)
SODIUM: 139 mmol/L (ref 135–145)
TOTAL PROTEIN: 7.9 g/dL (ref 6.5–8.1)
Total Bilirubin: 0.5 mg/dL (ref 0.3–1.2)

## 2016-02-22 LAB — CBC
HCT: 45.2 % (ref 39.0–52.0)
HEMOGLOBIN: 15 g/dL (ref 13.0–17.0)
MCH: 31.1 pg (ref 26.0–34.0)
MCHC: 33.2 g/dL (ref 30.0–36.0)
MCV: 93.8 fL (ref 78.0–100.0)
Platelets: 221 10*3/uL (ref 150–400)
RBC: 4.82 MIL/uL (ref 4.22–5.81)
RDW: 12.6 % (ref 11.5–15.5)
WBC: 6.9 10*3/uL (ref 4.0–10.5)

## 2016-02-22 LAB — URINALYSIS, ROUTINE W REFLEX MICROSCOPIC
BILIRUBIN URINE: NEGATIVE
GLUCOSE, UA: NEGATIVE mg/dL
HGB URINE DIPSTICK: NEGATIVE
KETONES UR: 15 mg/dL — AB
Leukocytes, UA: NEGATIVE
Nitrite: NEGATIVE
PROTEIN: NEGATIVE mg/dL
Specific Gravity, Urine: 1.024 (ref 1.005–1.030)
pH: 6 (ref 5.0–8.0)

## 2016-02-22 LAB — SURGICAL PCR SCREEN
MRSA, PCR: NEGATIVE
STAPHYLOCOCCUS AUREUS: POSITIVE — AB

## 2016-02-22 LAB — APTT: aPTT: 27 seconds (ref 24–37)

## 2016-02-22 LAB — ABO/RH: ABO/RH(D): A POS

## 2016-02-22 LAB — GLUCOSE, CAPILLARY: Glucose-Capillary: 106 mg/dL — ABNORMAL HIGH (ref 65–99)

## 2016-02-22 LAB — PROTIME-INR
INR: 0.95 (ref 0.00–1.49)
PROTHROMBIN TIME: 12.9 s (ref 11.6–15.2)

## 2016-02-22 NOTE — Pre-Procedure Instructions (Signed)
    [name]  02/22/2016      [id number] [address], [place] [address] Phone: [phone] Fax: [phone]  [facility] [address], [place] [address] Phone: [phone] Fax: [phone]    Your procedure is scheduled on 02/28/16.  Report to [facility] Admitting at 530 A.M.  Call this number if you have problems the morning of surgery:  [phone]   Remember:  Do not eat food or drink liquids after midnight.  Take these medicines the morning of surgery with A SIP OF WATER flomax,ultram Do not take any aspirin,anti-inflammatories,vitamins,or herbal supplements 5-7 days prior to surgery.  Do not wear jewelry, make-up or nail polish.  Do not wear lotions, powders, or perfumes.  You may wear deoderant.  Do not shave 48 hours prior to surgery.  Men may shave face and neck.  Do not bring valuables to the hospital.  [facility] is not responsible for any belongings or valuables.  Contacts, dentures or bridgework may not be worn into surgery.  Leave your suitcase in the car.  After surgery it may be brought to your room.  For patients admitted to the hospital, discharge time will be determined by your treatment team.  Patients discharged the day of surgery will not be allowed to drive home.   Name and phone number of your driver:    Special instructions:    Please read over the following fact sheets that you were given. MRSA Information

## 2016-02-23 LAB — HEMOGLOBIN A1C
HEMOGLOBIN A1C: 5.9 % — AB (ref 4.8–5.6)
MEAN PLASMA GLUCOSE: 123 mg/dL

## 2016-02-27 MED ORDER — CHLORHEXIDINE GLUCONATE 4 % EX LIQD: 60.0000 mL | Freq: Once | CUTANEOUS | Status: DC

## 2016-02-27 MED ORDER — CEFAZOLIN SODIUM-DEXTROSE 2-4 GM/100ML-% IV SOLN
2.0000 g | INTRAVENOUS | Status: AC
  Administered 2016-02-28 (×2): 2 g via INTRAVENOUS
  Filled 2016-02-27: qty 100

## 2016-02-28 ENCOUNTER — Inpatient Hospital Stay (HOSPITAL_COMMUNITY): Payer: Medicare Other | Admitting: Anesthesiology

## 2016-02-28 ENCOUNTER — Encounter (HOSPITAL_COMMUNITY)
Admission: RE | Disposition: A | Discharge status: Home Health | Payer: Self-pay | Source: Ambulatory Visit | Attending: Specialist

## 2016-02-28 ENCOUNTER — Inpatient Hospital Stay (HOSPITAL_COMMUNITY): Payer: Medicare Other

## 2016-02-28 ENCOUNTER — Encounter (HOSPITAL_COMMUNITY): Payer: Self-pay | Admitting: Anesthesiology

## 2016-02-28 ENCOUNTER — Inpatient Hospital Stay (HOSPITAL_COMMUNITY)
Admission: RE | Admit: 2016-02-28 | Discharge: 2016-03-04 | DRG: 460 | Disposition: A | Discharge status: Home Health | Payer: Medicare Other | Source: Ambulatory Visit | Attending: Specialist | Admitting: Specialist

## 2016-02-28 DIAGNOSIS — R339 Retention of urine, unspecified: Secondary | ICD-10-CM | POA: Diagnosis not present

## 2016-02-28 DIAGNOSIS — M5106 Intervertebral disc disorders with myelopathy, lumbar region: Secondary | ICD-10-CM | POA: Diagnosis present

## 2016-02-28 DIAGNOSIS — I10 Essential (primary) hypertension: Secondary | ICD-10-CM | POA: Diagnosis present

## 2016-02-28 DIAGNOSIS — K219 Gastro-esophageal reflux disease without esophagitis: Secondary | ICD-10-CM | POA: Diagnosis present

## 2016-02-28 DIAGNOSIS — Z8049 Family history of malignant neoplasm of other genital organs: Secondary | ICD-10-CM | POA: Diagnosis not present

## 2016-02-28 DIAGNOSIS — Z8249 Family history of ischemic heart disease and other diseases of the circulatory system: Secondary | ICD-10-CM

## 2016-02-28 DIAGNOSIS — Z981 Arthrodesis status: Secondary | ICD-10-CM | POA: Diagnosis not present

## 2016-02-28 DIAGNOSIS — M5137 Other intervertebral disc degeneration, lumbosacral region: Secondary | ICD-10-CM | POA: Diagnosis present

## 2016-02-28 DIAGNOSIS — Z87891 Personal history of nicotine dependence: Secondary | ICD-10-CM

## 2016-02-28 DIAGNOSIS — E119 Type 2 diabetes mellitus without complications: Secondary | ICD-10-CM | POA: Diagnosis present

## 2016-02-28 DIAGNOSIS — M47816 Spondylosis without myelopathy or radiculopathy, lumbar region: Secondary | ICD-10-CM | POA: Diagnosis not present

## 2016-02-28 DIAGNOSIS — M47817 Spondylosis without myelopathy or radiculopathy, lumbosacral region: Secondary | ICD-10-CM | POA: Diagnosis not present

## 2016-02-28 DIAGNOSIS — D62 Acute posthemorrhagic anemia: Secondary | ICD-10-CM | POA: Diagnosis not present

## 2016-02-28 DIAGNOSIS — M5136 Other intervertebral disc degeneration, lumbar region: Secondary | ICD-10-CM | POA: Diagnosis present

## 2016-02-28 DIAGNOSIS — M4806 Spinal stenosis, lumbar region: Secondary | ICD-10-CM | POA: Diagnosis present

## 2016-02-28 DIAGNOSIS — M4716 Other spondylosis with myelopathy, lumbar region: Secondary | ICD-10-CM | POA: Diagnosis present

## 2016-02-28 DIAGNOSIS — M51369 Other intervertebral disc degeneration, lumbar region without mention of lumbar back pain or lower extremity pain: Secondary | ICD-10-CM | POA: Diagnosis present

## 2016-02-28 DIAGNOSIS — R1909 Other intra-abdominal and pelvic swelling, mass and lump: Secondary | ICD-10-CM | POA: Diagnosis not present

## 2016-02-28 DIAGNOSIS — T819XXA Unspecified complication of procedure, initial encounter: Secondary | ICD-10-CM

## 2016-02-28 DIAGNOSIS — M5416 Radiculopathy, lumbar region: Secondary | ICD-10-CM | POA: Diagnosis not present

## 2016-02-28 DIAGNOSIS — J9811 Atelectasis: Secondary | ICD-10-CM | POA: Diagnosis not present

## 2016-02-28 DIAGNOSIS — K5730 Diverticulosis of large intestine without perforation or abscess without bleeding: Secondary | ICD-10-CM | POA: Diagnosis present

## 2016-02-28 DIAGNOSIS — M48062 Spinal stenosis, lumbar region with neurogenic claudication: Secondary | ICD-10-CM | POA: Diagnosis present

## 2016-02-28 DIAGNOSIS — Z903 Acquired absence of stomach [part of]: Secondary | ICD-10-CM

## 2016-02-28 DIAGNOSIS — F419 Anxiety disorder, unspecified: Secondary | ICD-10-CM | POA: Diagnosis present

## 2016-02-28 DIAGNOSIS — Z419 Encounter for procedure for purposes other than remedying health state, unspecified: Secondary | ICD-10-CM

## 2016-02-28 DIAGNOSIS — K5900 Constipation, unspecified: Secondary | ICD-10-CM | POA: Diagnosis present

## 2016-02-28 DIAGNOSIS — R1900 Intra-abdominal and pelvic swelling, mass and lump, unspecified site: Secondary | ICD-10-CM

## 2016-02-28 LAB — GLUCOSE, CAPILLARY
GLUCOSE-CAPILLARY: 108 mg/dL — AB (ref 65–99)
GLUCOSE-CAPILLARY: 132 mg/dL — AB (ref 65–99)
Glucose-Capillary: 143 mg/dL — ABNORMAL HIGH (ref 65–99)
Glucose-Capillary: 124 mg/dL — ABNORMAL HIGH (ref 65–99)
Glucose-Capillary: 120 mg/dL — ABNORMAL HIGH (ref 65–99)

## 2016-02-28 SURGERY — POSTERIOR LUMBAR FUSION 3 LEVEL
Anesthesia: General

## 2016-02-28 MED ORDER — INSULIN ASPART 100 UNIT/ML ~~LOC~~ SOLN
0.0000 [IU] | SUBCUTANEOUS | Status: DC
  Administered 2016-02-29 (×4): 2 [IU] via SUBCUTANEOUS
  Administered 2016-03-01: 3 [IU] via SUBCUTANEOUS
  Administered 2016-03-01 – 2016-03-02 (×4): 2 [IU] via SUBCUTANEOUS

## 2016-02-28 MED ORDER — CEFAZOLIN SODIUM-DEXTROSE 2-3 GM-% IV SOLR: INTRAVENOUS | Status: DC | PRN

## 2016-02-28 MED ORDER — ACETAMINOPHEN 650 MG RE SUPP
650.0000 mg | RECTAL | Status: DC | PRN
  Administered 2016-02-28 – 2016-03-01 (×3): 650 mg via RECTAL
  Filled 2016-02-28 (×3): qty 1

## 2016-02-28 MED ORDER — ALBUMIN HUMAN 5 % IV SOLN
INTRAVENOUS | Status: DC | PRN
  Administered 2016-02-28 (×3): via INTRAVENOUS

## 2016-02-28 MED ORDER — LACTATED RINGERS IV SOLN
INTRAVENOUS | Status: DC | PRN
  Administered 2016-02-28: 08:00:00 via INTRAVENOUS

## 2016-02-28 MED ORDER — PHENYLEPHRINE HCL 10 MG/ML IJ SOLN
10.0000 mg | INTRAVENOUS | Status: DC | PRN
  Administered 2016-02-28: 40 ug/min via INTRAVENOUS

## 2016-02-28 MED ORDER — SODIUM CHLORIDE 0.9 % IV SOLN
INTRAVENOUS | Status: DC
  Administered 2016-02-28 – 2016-03-02 (×8): via INTRAVENOUS

## 2016-02-28 MED ORDER — MENTHOL 3 MG MT LOZG: 1.0000 | LOZENGE | OROMUCOSAL | Status: DC | PRN

## 2016-02-28 MED ORDER — ONDANSETRON HCL 4 MG/2ML IJ SOLN
INTRAMUSCULAR | Status: DC | PRN
  Administered 2016-02-28: 4 mg via INTRAVENOUS

## 2016-02-28 MED ORDER — HEMOSTATIC AGENTS (NO CHARGE) OPTIME
TOPICAL | Status: DC | PRN
  Administered 2016-02-28 (×2): 1 via TOPICAL

## 2016-02-28 MED ORDER — FENTANYL CITRATE (PF) 250 MCG/5ML IJ SOLN
INTRAMUSCULAR | Status: AC
  Filled 2016-02-28: qty 5

## 2016-02-28 MED ORDER — LISINOPRIL 20 MG PO TABS: 20.0000 mg | ORAL_TABLET | Freq: Every day | ORAL | Status: DC

## 2016-02-28 MED ORDER — ROCURONIUM BROMIDE 100 MG/10ML IV SOLN
INTRAVENOUS | Status: DC | PRN
  Administered 2016-02-28: 50 mg via INTRAVENOUS

## 2016-02-28 MED ORDER — VECURONIUM BROMIDE 10 MG IV SOLR
INTRAVENOUS | Status: DC | PRN
  Administered 2016-02-28: 1 mg via INTRAVENOUS
  Administered 2016-02-28: 2 mg via INTRAVENOUS
  Administered 2016-02-28: 3 mg via INTRAVENOUS
  Administered 2016-02-28 (×3): 2 mg via INTRAVENOUS

## 2016-02-28 MED ORDER — THROMBIN 20000 UNITS EX SOLR
CUTANEOUS | Status: AC
  Filled 2016-02-28: qty 20000

## 2016-02-28 MED ORDER — PHENYLEPHRINE HCL 10 MG/ML IJ SOLN: 10.0000 mg | INTRAMUSCULAR | Status: DC | PRN

## 2016-02-28 MED ORDER — BISACODYL 5 MG PO TBEC
5.0000 mg | DELAYED_RELEASE_TABLET | Freq: Every day | ORAL | Status: DC | PRN
  Administered 2016-03-02: 5 mg via ORAL
  Filled 2016-02-28: qty 1

## 2016-02-28 MED ORDER — BUPIVACAINE HCL 0.5 % IJ SOLN
INTRAMUSCULAR | Status: DC | PRN
  Administered 2016-02-28: 20 mL

## 2016-02-28 MED ORDER — HYDROCODONE-ACETAMINOPHEN 5-325 MG PO TABS: 1.0000 | ORAL_TABLET | ORAL | Status: DC | PRN

## 2016-02-28 MED ORDER — 0.9 % SODIUM CHLORIDE (POUR BTL) OPTIME
TOPICAL | Status: DC | PRN
  Administered 2016-02-28 (×2): 1000 mL

## 2016-02-28 MED ORDER — POLYETHYLENE GLYCOL 3350 17 G PO PACK
17.0000 g | PACK | Freq: Every day | ORAL | Status: DC | PRN
  Administered 2016-03-02: 17 g via ORAL
  Filled 2016-02-28: qty 1

## 2016-02-28 MED ORDER — ROCURONIUM BROMIDE 50 MG/5ML IV SOLN
INTRAVENOUS | Status: AC
  Filled 2016-02-28: qty 1

## 2016-02-28 MED ORDER — PROPOFOL 10 MG/ML IV BOLUS
INTRAVENOUS | Status: DC | PRN
  Administered 2016-02-28: 150 mg via INTRAVENOUS
  Administered 2016-02-28: 20 mg via INTRAVENOUS
  Administered 2016-02-28: 50 mg via INTRAVENOUS

## 2016-02-28 MED ORDER — SODIUM CHLORIDE 0.9% FLUSH
3.0000 mL | Freq: Two times a day (BID) | INTRAVENOUS | Status: DC
  Administered 2016-02-28 – 2016-03-02 (×5): 3 mL via INTRAVENOUS

## 2016-02-28 MED ORDER — PROPOFOL 10 MG/ML IV BOLUS
INTRAVENOUS | Status: AC
  Filled 2016-02-28: qty 20

## 2016-02-28 MED ORDER — PHENYLEPHRINE HCL 10 MG/ML IJ SOLN
INTRAMUSCULAR | Status: DC | PRN
  Administered 2016-02-28 (×2): 80 ug via INTRAVENOUS
  Administered 2016-02-28: 40 ug via INTRAVENOUS
  Administered 2016-02-28 (×2): 80 ug via INTRAVENOUS
  Administered 2016-02-28: 40 ug via INTRAVENOUS

## 2016-02-28 MED ORDER — FENTANYL CITRATE (PF) 250 MCG/5ML IJ SOLN
INTRAMUSCULAR | Status: AC
Start: 2016-02-28 — End: 2016-02-28
  Filled 2016-02-28: qty 5

## 2016-02-28 MED ORDER — FLEET ENEMA 7-19 GM/118ML RE ENEM: 1.0000 | ENEMA | Freq: Once | RECTAL | Status: DC | PRN

## 2016-02-28 MED ORDER — ONDANSETRON HCL 4 MG/2ML IJ SOLN: 4.0000 mg | Freq: Once | INTRAMUSCULAR | Status: DC | PRN

## 2016-02-28 MED ORDER — ATROPINE SULFATE 0.4 MG/ML IV SOSY
PREFILLED_SYRINGE | INTRAVENOUS | Status: AC
  Filled 2016-02-28: qty 2.5

## 2016-02-28 MED ORDER — PHENOL 1.4 % MT LIQD: 1.0000 | OROMUCOSAL | Status: DC | PRN

## 2016-02-28 MED ORDER — SUGAMMADEX SODIUM 200 MG/2ML IV SOLN
INTRAVENOUS | Status: AC
Start: 2016-02-28 — End: 2016-02-28
  Filled 2016-02-28: qty 2

## 2016-02-28 MED ORDER — CEFAZOLIN SODIUM 1 G IJ SOLR
INTRAMUSCULAR | Status: AC
  Filled 2016-02-28: qty 20

## 2016-02-28 MED ORDER — METHOCARBAMOL 500 MG PO TABS
500.0000 mg | ORAL_TABLET | Freq: Four times a day (QID) | ORAL | Status: DC | PRN
  Administered 2016-02-28 – 2016-02-29 (×2): 500 mg via ORAL
  Filled 2016-02-28 (×3): qty 1

## 2016-02-28 MED ORDER — LIDOCAINE 2% (20 MG/ML) 5 ML SYRINGE
INTRAMUSCULAR | Status: AC
Start: 2016-02-28 — End: 2016-02-28
  Filled 2016-02-28: qty 5

## 2016-02-28 MED ORDER — THROMBIN 20000 UNITS EX SOLR
CUTANEOUS | Status: DC | PRN
  Administered 2016-02-28: 20000 [IU] via TOPICAL

## 2016-02-28 MED ORDER — CEFAZOLIN IN D5W 1 GM/50ML IV SOLN
1.0000 g | Freq: Three times a day (TID) | INTRAVENOUS | Status: AC
  Administered 2016-02-28 – 2016-02-29 (×2): 1 g via INTRAVENOUS
  Filled 2016-02-28 (×2): qty 50

## 2016-02-28 MED ORDER — FENTANYL CITRATE (PF) 100 MCG/2ML IJ SOLN
INTRAMUSCULAR | Status: AC
  Filled 2016-02-28: qty 2

## 2016-02-28 MED ORDER — KETOROLAC TROMETHAMINE 30 MG/ML IJ SOLN
30.0000 mg | Freq: Once | INTRAMUSCULAR | Status: AC
  Administered 2016-02-28: 30 mg via INTRAVENOUS
  Filled 2016-02-28: qty 1

## 2016-02-28 MED ORDER — MIDAZOLAM HCL 2 MG/2ML IJ SOLN
INTRAMUSCULAR | Status: AC
Start: 2016-02-28 — End: 2016-02-28
  Filled 2016-02-28: qty 2

## 2016-02-28 MED ORDER — FENTANYL CITRATE (PF) 100 MCG/2ML IJ SOLN
25.0000 ug | INTRAMUSCULAR | Status: DC | PRN
  Administered 2016-02-28 (×4): 25 ug via INTRAVENOUS

## 2016-02-28 MED ORDER — OXYCODONE HCL 5 MG/5ML PO SOLN: 5.0000 mg | Freq: Once | ORAL | Status: DC | PRN

## 2016-02-28 MED ORDER — FENTANYL CITRATE (PF) 100 MCG/2ML IJ SOLN
INTRAMUSCULAR | Status: DC | PRN
  Administered 2016-02-28 (×2): 50 ug via INTRAVENOUS
  Administered 2016-02-28: 150 ug via INTRAVENOUS
  Administered 2016-02-28 (×2): 50 ug via INTRAVENOUS

## 2016-02-28 MED ORDER — PANTOPRAZOLE SODIUM 40 MG PO TBEC
40.0000 mg | DELAYED_RELEASE_TABLET | Freq: Every day | ORAL | Status: DC
  Administered 2016-02-28 – 2016-03-04 (×6): 40 mg via ORAL
  Filled 2016-02-28 (×6): qty 1

## 2016-02-28 MED ORDER — ONDANSETRON HCL 4 MG/2ML IJ SOLN
INTRAMUSCULAR | Status: AC
  Filled 2016-02-28: qty 2

## 2016-02-28 MED ORDER — SUGAMMADEX SODIUM 200 MG/2ML IV SOLN
INTRAVENOUS | Status: DC | PRN
  Administered 2016-02-28: 150 mg via INTRAVENOUS

## 2016-02-28 MED ORDER — SODIUM CHLORIDE 0.9% FLUSH: 3.0000 mL | INTRAVENOUS | Status: DC | PRN

## 2016-02-28 MED ORDER — LACTATED RINGERS IV SOLN
INTRAVENOUS | Status: DC | PRN
  Administered 2016-02-28 (×3): via INTRAVENOUS

## 2016-02-28 MED ORDER — LISINOPRIL-HYDROCHLOROTHIAZIDE 20-12.5 MG PO TABS: 1.0000 | ORAL_TABLET | Freq: Every day | ORAL | Status: DC

## 2016-02-28 MED ORDER — DEXTROSE 5 % IV SOLN
500.0000 mg | Freq: Four times a day (QID) | INTRAVENOUS | Status: DC | PRN
  Filled 2016-02-28: qty 5

## 2016-02-28 MED ORDER — SODIUM CHLORIDE 0.9 % IV SOLN: 250.0000 mL | INTRAVENOUS | Status: DC

## 2016-02-28 MED ORDER — DOCUSATE SODIUM 100 MG PO CAPS
100.0000 mg | ORAL_CAPSULE | Freq: Two times a day (BID) | ORAL | Status: DC
  Administered 2016-02-28 – 2016-03-04 (×10): 100 mg via ORAL
  Filled 2016-02-28 (×10): qty 1

## 2016-02-28 MED ORDER — MIDAZOLAM HCL 5 MG/5ML IJ SOLN
INTRAMUSCULAR | Status: DC | PRN
  Administered 2016-02-28: 2 mg via INTRAVENOUS

## 2016-02-28 MED ORDER — ONDANSETRON HCL 4 MG/2ML IJ SOLN
4.0000 mg | INTRAMUSCULAR | Status: DC | PRN
Start: 2016-02-28 — End: 2016-03-04
  Administered 2016-02-28 – 2016-02-29 (×2): 4 mg via INTRAVENOUS
  Filled 2016-02-28 (×3): qty 2

## 2016-02-28 MED ORDER — MORPHINE SULFATE (PF) 2 MG/ML IV SOLN: 1.0000 mg | INTRAVENOUS | Status: DC | PRN

## 2016-02-28 MED ORDER — VECURONIUM BROMIDE 10 MG IV SOLR
INTRAVENOUS | Status: AC
  Filled 2016-02-28: qty 10

## 2016-02-28 MED ORDER — HYDROCHLOROTHIAZIDE 12.5 MG PO CAPS
12.5000 mg | ORAL_CAPSULE | Freq: Every day | ORAL | Status: DC
  Administered 2016-03-01 – 2016-03-04 (×4): 12.5 mg via ORAL
  Filled 2016-02-28 (×5): qty 1

## 2016-02-28 MED ORDER — OXYCODONE-ACETAMINOPHEN 5-325 MG PO TABS
1.0000 | ORAL_TABLET | ORAL | Status: DC | PRN
  Administered 2016-02-28 – 2016-02-29 (×2): 2 via ORAL
  Administered 2016-03-01: 1 via ORAL
  Administered 2016-03-01 – 2016-03-02 (×3): 2 via ORAL
  Filled 2016-02-28: qty 1
  Filled 2016-02-28 (×2): qty 2
  Filled 2016-02-28: qty 1
  Filled 2016-02-28 (×2): qty 2
  Filled 2016-02-28: qty 1
  Filled 2016-02-28: qty 2

## 2016-02-28 MED ORDER — LIDOCAINE HCL (CARDIAC) 20 MG/ML IV SOLN
INTRAVENOUS | Status: DC | PRN
  Administered 2016-02-28: 100 mg via INTRAVENOUS

## 2016-02-28 MED ORDER — SURGIFOAM 100 EX MISC
CUTANEOUS | Status: DC | PRN
  Administered 2016-02-28: 1 via TOPICAL

## 2016-02-28 MED ORDER — OXYCODONE HCL 5 MG PO TABS: 5.0000 mg | ORAL_TABLET | Freq: Once | ORAL | Status: DC | PRN

## 2016-02-28 MED ORDER — BUPIVACAINE LIPOSOME 1.3 % IJ SUSP
20.0000 mL | INTRAMUSCULAR | Status: AC
  Administered 2016-02-28: 20 mL
  Filled 2016-02-28: qty 20

## 2016-02-28 MED ORDER — SODIUM CHLORIDE 0.9 % IV SOLN
INTRAVENOUS | Status: DC | PRN
  Administered 2016-02-28: 13:00:00 via INTRAVENOUS

## 2016-02-28 MED ORDER — BUPIVACAINE HCL (PF) 0.5 % IJ SOLN
INTRAMUSCULAR | Status: AC
  Filled 2016-02-28: qty 30

## 2016-02-28 MED ORDER — ACETAMINOPHEN 325 MG PO TABS
650.0000 mg | ORAL_TABLET | ORAL | Status: DC | PRN
  Administered 2016-03-03 – 2016-03-04 (×2): 650 mg via ORAL
  Filled 2016-02-28 (×2): qty 2

## 2016-02-28 MED ORDER — SODIUM CHLORIDE 0.9 % IJ SOLN
INTRAMUSCULAR | Status: DC | PRN
  Administered 2016-02-28 (×2): 10 mL

## 2016-02-28 MED FILL — Sodium Chloride IV Soln 0.9%: INTRAVENOUS | Qty: 2000 | Status: AC

## 2016-02-28 MED FILL — Heparin Sodium (Porcine) Inj 1000 Unit/ML: INTRAMUSCULAR | Qty: 30 | Status: AC

## 2016-02-28 SURGICAL SUPPLY — 78 items
ADH SKN CLS APL DERMABOND .7 (GAUZE/BANDAGES/DRESSINGS) ×1
BLADE SURG ROTATE 9660 (MISCELLANEOUS) IMPLANT
BONE CANC CHIPS 20CC PCAN1/4 (Bone Implant) ×2 IMPLANT
BONE MATRIX VIVIGEN 5CC (Bone Implant) ×2 IMPLANT
BUR MATCHSTICK NEURO 3.0 LAGG (BURR) ×2 IMPLANT
BUR RND FLUTED 2.5 (BURR) IMPLANT
CAGE BULLET CONCORDE 9X10X27 (Cage) ×1 IMPLANT
CAGE CONCORDE BULLET 9X11X27 (Cage) ×2 IMPLANT
CAGE SPNL 5D BLT NOSE 27X9X11 (Cage) IMPLANT
CAGE TPAL 9X10X28 (Cage) ×1 IMPLANT
CHIPS CANC BONE 20CC PCAN1/4 (Bone Implant) ×1 IMPLANT
CONNECTOR CROSS A3 SFX (Connector) ×1 IMPLANT
COVER MAYO STAND STRL (DRAPES) ×4 IMPLANT
COVER SURGICAL LIGHT HANDLE (MISCELLANEOUS) ×3 IMPLANT
DERMABOND ADVANCED (GAUZE/BANDAGES/DRESSINGS) ×1
DERMABOND ADVANCED .7 DNX12 (GAUZE/BANDAGES/DRESSINGS) ×1 IMPLANT
DRAPE C-ARM 42X72 X-RAY (DRAPES) ×4 IMPLANT
DRAPE C-ARMOR (DRAPES) ×2 IMPLANT
DRAPE POUCH INSTRU U-SHP 10X18 (DRAPES) ×2 IMPLANT
DRAPE SURG 17X23 STRL (DRAPES) ×6 IMPLANT
DRAPE TABLE COVER HEAVY DUTY (DRAPES) ×2 IMPLANT
DRSG MEPILEX BORDER 4X4 (GAUZE/BANDAGES/DRESSINGS) ×1 IMPLANT
DRSG MEPILEX BORDER 4X8 (GAUZE/BANDAGES/DRESSINGS) IMPLANT
DURAPREP 26ML APPLICATOR (WOUND CARE) ×2 IMPLANT
ELECT BLADE 4.0 EZ CLEAN MEGAD (MISCELLANEOUS) ×2
ELECT BLADE 6.5 EXT (BLADE) IMPLANT
ELECT CAUTERY BLADE 6.4 (BLADE) ×2 IMPLANT
ELECT REM PT RETURN 9FT ADLT (ELECTROSURGICAL) ×2
ELECTRODE BLDE 4.0 EZ CLN MEGD (MISCELLANEOUS) IMPLANT
ELECTRODE REM PT RTRN 9FT ADLT (ELECTROSURGICAL) ×1 IMPLANT
EVACUATOR 1/8 PVC DRAIN (DRAIN) IMPLANT
GLOVE BIOGEL PI IND STRL 8 (GLOVE) ×1 IMPLANT
GLOVE BIOGEL PI INDICATOR 8 (GLOVE) ×1
GLOVE ECLIPSE 9.0 STRL (GLOVE) ×2 IMPLANT
GLOVE ORTHO TXT STRL SZ7.5 (GLOVE) ×2 IMPLANT
GLOVE SURG 8.5 LATEX PF (GLOVE) ×2 IMPLANT
GOWN STRL REUS W/ TWL LRG LVL3 (GOWN DISPOSABLE) ×1 IMPLANT
GOWN STRL REUS W/TWL 2XL LVL3 (GOWN DISPOSABLE) ×4 IMPLANT
GOWN STRL REUS W/TWL LRG LVL3 (GOWN DISPOSABLE) ×2
GRAFT BNE CANC CHIPS 1-8 20CC (Bone Implant) IMPLANT
GRAFT BNE MATRIX VG 5 (Bone Implant) IMPLANT
HEMOSTAT SURGICEL 2X14 (HEMOSTASIS) ×1 IMPLANT
KIT BASIN OR (CUSTOM PROCEDURE TRAY) ×2 IMPLANT
KIT POSITION SURG JACKSON T1 (MISCELLANEOUS) ×2 IMPLANT
KIT ROOM TURNOVER OR (KITS) ×2 IMPLANT
NDL ASP BONE MRW 11GX15 (NEEDLE) IMPLANT
NDL SPNL 18GX3.5 QUINCKE PK (NEEDLE) ×1 IMPLANT
NEEDLE 22X1 1/2 (OR ONLY) (NEEDLE) ×2 IMPLANT
NEEDLE ASP BONE MRW 11GX15 (NEEDLE) ×2 IMPLANT
NEEDLE BONE MARROW 8GAX6 (NEEDLE) IMPLANT
NEEDLE SPNL 18GX3.5 QUINCKE PK (NEEDLE) ×2 IMPLANT
NS IRRIG 1000ML POUR BTL (IV SOLUTION) ×3 IMPLANT
PACK LAMINECTOMY ORTHO (CUSTOM PROCEDURE TRAY) ×2 IMPLANT
PAD ARMBOARD 7.5X6 YLW CONV (MISCELLANEOUS) ×4 IMPLANT
PATTIES SURGICAL .75X.75 (GAUZE/BANDAGES/DRESSINGS) IMPLANT
PATTIES SURGICAL 1X1 (DISPOSABLE) ×2 IMPLANT
ROD EXPEDIUM PREBENT 95MM (Rod) ×2 IMPLANT
SCREW CORTICAL VIPER 7X35 (Screw) ×5 IMPLANT
SCREW CORTICAL VIPER 7X40MM (Screw) ×1 IMPLANT
SCREW SET SINGLE INNER (Screw) ×8 IMPLANT
SCREW VIPER 7MMX30MM CORTICAL (Screw) ×2 IMPLANT
SPONGE LAP 4X18 X RAY DECT (DISPOSABLE) ×2 IMPLANT
SURGIFLO W/THROMBIN 8M KIT (HEMOSTASIS) ×2 IMPLANT
SUT VIC AB 0 CT1 27 (SUTURE) ×2
SUT VIC AB 0 CT1 27XBRD ANBCTR (SUTURE) ×1 IMPLANT
SUT VIC AB 1 CTX 36 (SUTURE) ×2
SUT VIC AB 1 CTX36XBRD ANBCTR (SUTURE) ×1 IMPLANT
SUT VIC AB 2-0 CT1 27 (SUTURE) ×4
SUT VIC AB 2-0 CT1 TAPERPNT 27 (SUTURE) ×1 IMPLANT
SUT VIC AB 3-0 X1 27 (SUTURE) ×3 IMPLANT
SUT VICRYL 0 CT 1 36IN (SUTURE) ×2 IMPLANT
SYR 20CC LL (SYRINGE) ×2 IMPLANT
SYR CONTROL 10ML LL (SYRINGE) ×4 IMPLANT
TOWEL OR 17X24 6PK STRL BLUE (TOWEL DISPOSABLE) ×2 IMPLANT
TOWEL OR 17X26 10 PK STRL BLUE (TOWEL DISPOSABLE) ×2 IMPLANT
TRAY FOLEY CATH 16FRSI W/METER (SET/KITS/TRAYS/PACK) ×2 IMPLANT
WATER STERILE IRR 1000ML POUR (IV SOLUTION) ×2 IMPLANT
YANKAUER SUCT BULB TIP NO VENT (SUCTIONS) ×2 IMPLANT

## 2016-02-28 NOTE — H&P (Signed)
[name] is an 70 y.o. male.     [name] is a 70 year old male who returns today.  He has been experiencing problems with ongoing severe pain in his back with radiation into his buttocks and thighs.  Pain with standing and ambulation.  Only able to ambulate a distance of 100 to 150 yards with severe back pain and discomfort.  He is status post multiple level decompressive laminectomy in 2012 by Dr. Joya Salm.  He apparently had some good pain relief that lasted 1 or 2 years with recurrence of discomfort and pain.  He notes his back pain is as bad as his leg pain and that his leg pain improves when he first sits down, but then after a period of several minutes he begins to experience increasing low back discomfort.  The results of this patient's MRI scan done in July 2016, showed degenerative disc disease at L3-4, L4-5 and L5-S1.  There were also some degenerative disc changes noted at L1-2.  Severe disc space collapse at L5-S1 with retrolisthesis of this segment.  He has areas of modic changes seen at the L5-S1 and L4-5 level on MRI study.  The study notes disc bulges that are asymmetric to the right and bilateral foraminal narrowing that is mild at the L3-4 level, also noted to be mild to moderate at the L4-5 level and at L5-S1 with mild to moderate left foraminal stenosis.  The degree of root compression does seem to be left-sided at the L5-S1 level greater than the right and on the right side greater than the left at L4-5 and at L3-4 level.  His pain is such that he is taking medicines of Norco to relieve his discomfort, requesting a refill of his narcotic medicine today.  The patient has undergone recent myelogram and post myelogram CT scan and is here for a review.  He is taking at least 2 Norco tablets a day in attempts to relieve his discomfort.  He notes that he has difficulty even sleeping at nighttime due to pain in his back and into his buttock and thighs.  It does, however,  significantly worsen when he tries to stand and ambulate and again if he does any kind of bending, twisting or turning.  His left leg discomfort is greater than his right.     Past Medical History  Diagnosis Date  . Hypertension   . Diverticulosis of colon (without mention of hemorrhage)   . Anemia   . GERD (gastroesophageal reflux disease)   . Anxiety   . Diabetes mellitus without complication [facility])     Past Surgical History  Procedure Laterality Date  . Partial gastrectomy    . Back surgery      Family History  Problem Relation Age of Onset  . Uterine cancer Mother   . Liver cancer Father   . Colon cancer Neg Hx   . Esophageal cancer Neg Hx   . Diabetes Mother   . Heart disease Mother   . Kidney disease Neg Hx    Social History:  reports that he has quit smoking. He has never used smokeless tobacco. He reports that he does not drink alcohol or use illicit drugs.  Allergies: No Known Allergies  Medications Prior to Admission  Medication Sig Dispense Refill  . HYDROcodone-acetaminophen (NORCO) 7.5-325 MG tablet Take 1 tablet by mouth every 6 (six) hours as needed for moderate pain.    Marland Kitchen lisinopril-hydrochlorothiazide (PRINZIDE,ZESTORETIC) 20-12.5 MG per tablet Take 1 tablet by mouth daily.    Marland Kitchen  metFORMIN (GLUCOPHAGE) 500 MG tablet Take 500 mg by mouth 2 (two) times daily with a meal.    . omeprazole (PRILOSEC) 20 MG capsule Take 20 mg by mouth daily.      Results for orders placed or performed during the hospital encounter of 02/28/16 (from the past 48 hour(s))  Glucose, capillary     Status: Abnormal   Collection Time: 02/28/16  6:36 AM  Result Value Ref Range   Glucose-Capillary 108 (H) 65 - 99 mg/dL   No results found.  Review of Systems  Constitutional: Negative.   HENT: Negative.   Respiratory: Negative.   Cardiovascular: Negative.   Gastrointestinal: Negative.   Genitourinary: Negative.   Musculoskeletal: Positive for back pain.  Skin: Negative.    Psychiatric/Behavioral: Negative.     Blood pressure 123/76, pulse 84, temperature 97.6 F (36.4 C), temperature source Oral, resp. rate 18, height 5\' 6"  (1.676 m), weight 69.485 kg (153 lb 3 oz), SpO2 97 %. Physical Exam  Constitutional: He is oriented to person, place, and time. No distress.  HENT:  Head: Atraumatic.  Eyes: EOM are normal.  Neck: Normal range of motion.  Cardiovascular: Normal rate.   Respiratory: No respiratory distress.  GI: He exhibits no distension.  Musculoskeletal: He exhibits tenderness.  Neurological: He is alert and oriented to person, place, and time.  Skin: Skin is warm and dry.  Psychiatric: He has a normal mood and affect.    PHYSICAL EXAMINATION:  His sciatic tension tests are negative.  He has weakness in left foot dorsiflexion, which is 4/5.  EHL strength is weak on the left at L4-5.  Knee extension strength is intact bilaterally.  Hip flexion, hip adduction and abduction strength are intact.   RADIOGRAPHS:  Reviewed the CT myelogram with [facility] and it does show subarticular stenosis present at L3-4 and L4-5.  There is foraminal narrowing present in the subarticular area and also in the area of the lateral recess.  The central portions of the canal appear to be well decompressed, though.  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   ASSESSMENT:  Recurring lumbar spinal stenosis in the subarticular and foraminal areas bilaterally at L3-4, L4-5 and L5-S1.  This is due to a combination of disc bulge, congenital stenosis with shortened pedicles, narrowing of the neural foramen, and this becomes worsened with degenerative disc changes resulting in rather significant foraminal stenosis at L3-4, L4-5, and L5-S1.  Disc degeneration with modic changes noted on the MRI at L4-5 and L5-S1.  The patient's pain  pattern suggests that there is both mechanical pain and claudication symptoms associated with stenosis.    PLAN:  The patient has had an attempt at conservative management including facet blocks and even radiofrequency ablation, attempts at steroid injections and this has not been of much benefit.  Exercises do not seem to improve his pain pattern, so that I am recommending at this time that he consider undergoing a three-level decompression at L3-4, L4-5 and L5-S1 with redo decompressions at the upper 2 segments and a transforaminal lumbar interbody fusion in order to restore height of the disc at L3-4, L4-5 and L5-S1, and to allow for adequate decompression of the subarticular zone and lateral recess bilaterally at L3-4, L4-5 and L5-S1.  This should improve this patient's standing and walking tolerance.  I have discussed with Lionardo the fact  that fusions for degenerative disc disease do not necessarily relieve all of the pain associated with degenerative discs and the degree of relief of pain in the back, most likely more on the order of 60% or 70% relief, but I do not believe it will relieve all of his back pain, as he does have degeneration occurring throughout his thoracolumbar spine by his myelogram.  The risks of surgery include risk of infection 1 in 300  and risks of bleeding.  The use of Cell Saver would be recommended.  Risks to the nerves and injury about 1 in 10,000.  Surgery would include placement of interbody disc spacers in the form of cages and the use of bone graft, local bone graft, and allograft as well as ViviGen.  Pedicle screws and rods.  I demonstrated these to [facility] so that he understood.  An interpreter was present throughout the visit today, which was extremely helpful in the transfer of information here.  The patient will be scheduled then to undergo intervention.  Cell Saver is recommended.  Lanae Crumbly, PA-C 02/28/2016, 7:18 AM

## 2016-02-28 NOTE — Op Note (Addendum)
02/28/2016  2:55 PM  PATIENT:  [name]  70 y.o. 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     COMPONENTS:  Implant Name Type Inv. Item Serial No. Manufacturer Lot No. LRB No. Used  SCREW CORTICAL VIPER 7X35 - [id number] Screw SCREW CORTICAL VIPER 7X35  DEPUY SPINE  N/A 5  SCREW VIPER 7MMX30MM CORTICAL - [id number] Screw SCREW VIPER 7MMX30MM CORTICAL  DEPUY SPINE  N/A 2  SCREW CORTICAL VIPER 7X40MM - [id number] Screw SCREW CORTICAL VIPER 7X40MM  DEPUY SPINE  N/A 1  SCREW SET SINGLE INNER - [id number] Screw SCREW SET SINGLE INNER  DEPUY SPINE  N/A 8  BONE CANC CHIPS 20CC - [id number] Bone Implant BONE CANC CHIPS 20CC [id number] [facility]  N/A 1  BONE MATRIX VIVIGEN 5CC - [id number] Bone Implant BONE MATRIX VIVIGEN 5CC [id number] [employer] [facility]  N/A 1  T-PAL 29m Lordotic    STRYKER SPINE  N/A 1  CAGE CONCORDE BULLET 9X11X27MM - [id number] CAGE CONCORDE BULLET 9X11X27MM  DEPUY SPINE  N/A 1  CAGE BULLET CONCORDE 9X10X27MM - [id number] CAGE BULLET CONCORDE 9X10X27MM  DEPUY SPINE  N/A 1  ROD EXPEDIUM PREBENT 95MM - [id number] ROD EXPEDIUM PREBENT  95MM  DEPUY SPINE  N/A 2  CONNECTOR CROSS A3 SFX -- [id number] CONNECTOR CROSS A3 SFX   DEPUY SPINE   N/A 1     PROCEDURE: The patient was met in the holding area, and the appropriate lumbar levels identified and marked with an "X" and my initials. I had discussion with the patient in the preop holding area regarding a change of consent form.The fusion levels are reidentified as L3-4,L4-5 and L5-S1. Patient understands the rationale to perform TLIFs at three levels to decompress the left L3-4,  L4-5 and L5-S1 lateral recess and foramenal stenosis and to allow for preservation of lordotic curve. The L2-3 disc is normal on MRI and preservation of lordosis from L3-S1 should  decrease the risk of fuse to the L1 level.  The patient was then transported to OR and was placed under general anesthetic with some mild difficulty requiring endoscopic assisted placement of the ETT, Dr. STobias Alexanderassisted. The patient received appropriate preoperative antibiotic prophylaxis Ancef.  Nursing staff inserted a Foley catheter under sterile conditions. The patient was then turned to a prone position using the JBawcomvillespine frame. PAS. all pressure points well padded the arms at the side to 90 90. Standard prep with DuraPrep solution draped in the usual manner from the lower dorsal spine the mid sacral segment. Iodine Vi-Drape was used and the old incision scar was marked. Time-out procedure was called and correct. Skin in the midline between L1 and S2 was then infiltrated with Marcaine half percent 1:1 exparel 1.3% total of 20  cc used. Incision was then made ellipsing the old incision scar extending from L2-S1 through the skin and subcutaneous layers down to the patient's lumbodorsal fascia and spinous processes of L2 and L5. The incision then carried sharply excising the supraspinous ligament and then continuing the lateral aspect of the spinous processes of  L2-3 and L5-S1. Cobb elevator used to carefully elevate the  paralumbar muscles off of the posterior elements arefully exposing the central laminotomy at the L3-4 and L4-5 levels using electrocautery carefully to perform dissection of the muscle tissues of the preserving the facet at the L3-4. Continuing the exposure out laterally to expose the mid portion of the facet, L3-4,  L4-5 and L5-S1in the midline and out to the sacra ala bilaterally exposing the facets bilaterally.   Viper retractor was used for retraction of the incision and the cerebellar retractor distally. Spinous processes of  L2 and L5-S1, were then exposed down to the base the lamina at each segment.Loupe magnification and headlight were used during this portion procedure C-arm fluoroscopy was then brought into the field and using C-arm fluoroscopy then a hole made into the lateral aspect of the pedicle of left L3 observed in the pedicle using ball-tipped nerve hook and hockey stick nerve probe initial entry was determined on fluoroscopy to be good position alignment so that a ball handled probe was then used to probe the left L3 pedicle to a depth of nearly 40 mm observed on C-arm fluoroscopy to be beyond the midpoint of the lumbar vertebra and then position alignment within the left L3 pedicle this was then removed and the pedicle channel probed demonstrating patency no sign of rupture the cortex of the pedicle. Tapping with a 4 mm screw then 5.0, 6.0 and 7.0 mm taps, then a 7.0 x 40 mm screw was placed on the table for use of the left side at the L3 level following the TLIF portion of the case. C-arm fluoroscopy was then brought into the field and using C-arm fluoroscopy then a hole made into the lateral aspect of the pedicle of right L3 observed in the pedicle using ball tipped nerve hook and hockey stick nerve probe initial entry was determined on fluoroscopy to be good position alignment so that a ball handled probe was then used to probe the left L3 pedicle to a depth of nearly 40 mm observed on C-arm  fluoroscopy to be beyond the midpoint of the lumbar vertebra and then position alignment within the right L3 pedicle this was then removed and the pedicle channel probed demonstrating patency no sign of rupture the cortex of the pedicle. Tapping with a 36m, 629mand 56m63mcrewtaps then 7.0 mm x 35 mm screw was placed on the right side at the L3 level C-arm fluoroscopy was then brought into the field and using C-arm fluoroscopy then a hole made into the medial aspect of the left pedicle of L4 observed in the pedicle using ball tipped nerve hook and hockey stick nerve probe initial entry was determined on fluoroscopy to be good position alignment so that a match stick burr was then used to drill the left L4 pedicle to a depth of nearly 35 mm observed on C-arm fluoroscopy to be beyond the midpoint of the lumbar vertebra and then position alignment within the left L4 pedicle this was then removed and the pedicle channel probed demonstrating patency no sign of rupture the cortex of the pedicle. Tapping with a 5 mm, 6 mm and 7.0mm9mrew tap then  7.0 mm x 35 mm screw was placed on  the table for use of the left side at the L4 level following the TLIF portion of the case at the left side at the L4 level. The pedicle channel of L4 on the right probed demonstrating patency no sign of rupture the cortex of the pedicle. C-arm fluoroscopy was used to localize the hole made in the medial aspect of the pedicle of L4 on the right localizing the pedicle within the spinal canal with nerve hook and hockey-stick nerve probe carefully passed down the center of the L4 pedicle to a depth of nearly 35 mm. Observed on C-arm fluoroscopy to be in good position alignment channel was probed with a ball-tipped probe ensure patency no sign of cortical disruption. Following tapping with a 5 mm, 27m and 7.0 tap, a 7.0 x 35 mm screw was placed on the right side pedicle at L4. C-arm fluoroscopy was used to localize the hole made in the lateral aspect  of the pedicle of L5 on the left localizing the pedicle within the spinal canal with nerve hook and hockey-stick nerve probe carefully passed down the center of the L5 pedicle to a depth of nearly 35 mm. Observed on C-arm fluoroscopy to be in good position alignment channel was probed with a ball-tipped probe ensure patency no sign of cortical disruption. Following tapping with a 5 mm, 6.049mand 7.12m61maps, a 7.0 x 35 mm screw was placed on the table for use of the left side at the L5 level following the TLIF portion of the case left side pedicle at L5. C-arm fluoroscopy was used to localize the hole made in the medial aspect of the pedicle of L5 on the right localizing the pedicle within the spinal canal with nerve hook and hockey-stick nerve probe carefully passed down the center of the L5 pedicle to a depth of nearly 35 mm. Observed on C-arm fluoroscopy to be in good position alignment channel was probed with a ball-tipped probe ensure patency no sign of cortical disruption. Following tapping with a 4 mm, 5 mm, 6mm29md 7.12mm 38ms a 7.0 x 35 mm screw was placed on the right side pedicle at L5. C-arm fluoroscopy was used to localize the hole made in the medial aspect of the pedicle of S1 on the left localizing the pedicle with a ball tipped probe carefully passed down the center of the S1 pedicle to a depth of nearly 30 mm. Observed on C-arm fluoroscopy to be in good position alignment channel was probed with a ball-tipped probe ensure patency no sign of cortical disruption. Following tapping with a 4 mm, 5 mm, 6.12mm a46m7.12mm ta29m a 7.0 x 30 mm screw was placed on the table for use of the left side at the S1 level following the TLIF portion of the case left side pedicle at S1. C-arm fluoroscopy was used to localize the hole made in the medial aspect of the pedicle of S1 on the right localizing the pedicle with a ball tipped probe carefully passed down the center of the S1 pedicle to a depth of nearly 30 mm.  Observed on C-arm fluoroscopy to be in good position alignment channel was probed with a ball-tipped probe ensure patency no sign of cortical disruption. Following tapping with a 4 mm, 5 mm, 6.12mm and22m12mm taps65m 7.0 x 30 mm screw was placed in the right side pedicle at S1.  Attention then turned to placement of the transforaminal lumbar interbody  fusion cages. The left medial facet of L3-4, L4-5 and L5-S1 were resected in order to decompress the left side of the lumbar thecal sac at L3-4, L4-5 and L5-S1 decompressing the left L3,L4, L5 and S1 neuroforamen. Osteotomes and 54m and 317mkerrisons were used for this portion of the decompression. Complete facetectomies were perform on the left at L3-4 , L4-5 and L5-S1 to provide for exposure of the right side L3-4, L4-5 and L5-S1 neuroforamen for ease of placement of TLIFs (transforaminal lumbar interbody fusion) at each level inferior portions of the lamina and pars were also resected first beginning with the Leksell rongeur and osteotomes and then resecting using 2 and 3 mm Kerrison. Then performing a resection Of the left L3 lamina and at L4 and L5 levels. The inferior articular process  L3, L4, and L5 were resected on the left side. The OR Microscope was then sterilely draped and brought into the field. The left L5 nerve root identified and the medial aspect of the L5 pedicle. Superior articular processes of S, L5 and L4 were then resected from the left side further decompressing the left L5, L4 and L3 nerves and providing for exposure of the area just superior to the S1, L5, and L4 pedicles for a placement the interbody cages. 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  Using a Penfield 4 the left lateral aspect of the thecal sac at the L5-S1 disc space was carefully freed up The thecal sac could then easily be retracted in the  posterior lateral aspect of the L5-S1 disc was exposed 15 blade scalpel used to incise the posterolateral disc and an osteotome used to resect a small portion of bone off the superior aspect of the posterior superior vertebral body of S1 in order to ease the entry into the L5-S1 disc space. A  13m84merrison rongeur was then able to be introduced in the disc space debrided it was quite narrow. 7 mm dilator was used to dialate the L5-S1 disc space on the left side attempts were made to dilate further the 8 and 9 mm were successful and using small curettes and the disc space was debrided a minimal degenerative disc present in the endplates debrided to bleeding endplate bone. A 9 mm lordotic TPAL cage was carefully packed with morcellized bone graft and the been harvested from previous laminotomies additional bone graft was then packed into the intervertebral disc space using the 7 mm trial  packed the graft multiple times. With this then a 9 mm lordotic TPAL cage was able to be introduced into the disc space on the left side using the correct degree convergence and then impacted then rotated into transverse postion in the anterior half of the disc space. Bleeding controlled using bipolar electrocautery thrombin soaked gel cottonoids. Then turned to the left L4-5 level similarly the exposure the posterior lateral aspect this was carried out using a Penfield 4 bipolar electrocautery to control small bleeders present. Derricho retractor used to retract the thecal sac and L5 nerve root a 15 blade scalpel was used to incise posterior lateral aspect of the disc the disc space at this level showed a rather severe narrowing posteriorly was more open anteriorly so that an osteotome again was used to resect a small portion the posterior superior lip of the vertebral body at L5  in order to gain ease of access  into the L4-5 disc space. The space was debrided of degenerative disc material using pituitary along root the entire disc  space was then debrided of degenerative disc material using pituitary rongeurs curettage down to bleeding bone endplates. Residual disc was resected using pituitary. This space was then carefully assess using spacers  An 11.7m x 280mcage provided the best fit the lordotic cage was chosen the intervertebral disc space was then packed with autogenous local that been harvested from the central laminectomy and allograft cancellous bone graft and vivigen, this was pack multiple times and impacted with 7 mm trial cage apparatus. This provided excellent bone graft within the intervertebral disc space at L4-5 so that the permanent 11.0 mm lordotic concord cage 11.0 mm x27 mm was then packed with local bone graft placed into the intervertebral disc space and impacted into place in the correct degree of convergence. Bleeding controlled using bipolar electrocautery. The cage was subset beneath the posterior aspect of the L4-5 disc space by about 3-4 mm . Bleeding hemostasis then attention was turned to the left side at the L3-4 level the left side portion of the disc was excised on the left side then carefully retracting the thecal sac and the appropriate nerve L4 root,  pituitary rongeurs were used to debride this cavity L3-4 level a lamina spreader was necessary in order to open the disc space wide enough to accept instruments to debride the intervertebral disc space. At the L3-4 level the disc a 8 mm spreader was able to be introduced and then an 9 and 1089mpreader. 76m83m27mm31me was lordotic concord type. A vertebral disc space was debrided of degenerative disc material and endplate and a curet and pituitary rongeurs. This was completed local bone graft that been harvested from the central laminectomy and allograft cancellous bone graft and vivigen was packed into the disc space multiple time using the smaller 8 mm trial to impact the graft. Finally the 76mm 77m mm  Concorde lordotic cage was packed with local bone  graft and this was then impacted into the disc space on the left side at L3-4.  The cage was placed on the left side but also directed anteriorly in order to preserve the lordosis present. Cage was subset beneath the posterior aspect of the disc space 2 or 3 mm. Observed on C-arm fluoroscopy to be in good position alignment. The cages at L3-4, L4-5 and L5-S1 were placed anteriorly as best present. With this then the transforaminal lumbar interbody fusion portion of the case was completed bleeders were controlled using bipolar electrocautery thrombin-soaked Gelfoam were appropriate. 4 pedicle screws on the left side L3, L4, L5 and S1 then each carefully aligned and tightened or loose and a slight amount to allow for placement of rods. The left side first, quarter inch titanium 95 mm precontoured rod was then carefully contoured appropriately. This was then placed into the pedicle screws on the left extending from L3-S1 each of the capsule and carefully placed loosely tightened. Attention turned to the right side were similarly and then screws were carefully adjusted to allow for a better pattern screws to allow for placement of fixation rod for the rod was then taken and a quarter inch titanium rod was then carefully contoured. This was able to be inserted into the right pedicle screw fasteners and then using a persuader to place caps onto the right L4 fasteners the upper 2 pedicle screw caps appeared to fit quite well. The left L3  rod fastener cap was then tightened 85 pounds then on the left side disc space at L3-4 and L4-5 and L5-S1 screw fasteners compression was obtained on the left side between L3 and L4, L4 and L5, and L5 and S1 by compressing between the fasteners right hand side and tightening the screw caps 85 pounds. Similarly this was done on the right side at L3-4, L4-5 and L5-S1 screws using  compression and tightening the fastener caps to  85 pounds. Iirrigation was carried out with copious amounts of  saline solution this was done throughout the case. Cell Saver was used during the case. A single cross-link for the Depuy expedium system was placed at the L4-5 level measured with the measuring tool and the appropriate A3 cross-link was then carefully contoured and applied to the rods and tightened again to 80 foot-pounds bilaterally using the appropriate torque screwdriver. Center screw on the cross-link was then carefully tightened 80 foot-pounds.  Permanent C-arm images were obtained in AP and lateral plane. Remaining local bone graft was then applied along the right lateral posterior lateral region extending from L3 through S1 facets to the right L5-S1 posterior facet fusion mass. Excess Gelfoam was then removed the lumbodorsal musculature carefully exam debrided of any devitalized tissue following removal of Vicryl retractors were the bleeders were controlled using electrocautery and the area dorsal lumbar muscle were then approximated in the midline with interrupted #1 Vicryl sutures loose the dorsal fascia was reattached to the spinous process of L2 to superiorly and S1 inferiorly this was done with #1 Vicryl sutures. Subcutaneous layers then approximated using interrupted 0 Vicryl sutures and 2-0 Vicryl sutures. Skin was closed with a running subcutaneous stitch of 4-0 Vicryl Dermabond was applied then MedPlex bandage. All instrument and sponge counts were correct. The patient was then returned to a supine position on her bed reactivated extubated and returned to the recovery room in satisfactory condition.   Benjiman Core, PA-C perform the duties of assistant surgeon during this case. He was present from the beginning of the case to the end of the case assisting in transfer the patient from his stretcher to the OR table and back to the stretcher at the end of the case. Assisted in careful retraction and suction of the laminectomy site delicate neural structures operating under the operating room  microscope. He performed closure of the incision from the fascia to the skin applying the dressing.           Jayin Derousse E  02/28/2016, 2:55 PM

## 2016-02-28 NOTE — Discharge Instructions (Signed)
° ° °  Call if there is increasing drainage, fever greater than 101.5, severe head aches, and °worsening nausea or light sensitivity. °If shortness of breath, bloody cough or chest tightness or pain go to an emergency room. °No lifting greater than 10 lbs. °Avoid bending, stooping and twisting. °Use brace when sitting and out of bed even to go to bathroom. °Walk in house for first 2 weeks then may start to get out slowly increasing distances up to one quarter mile by 4-6 weeks post op. °After 5 days may shower and change dressing following bathing with shower.When °bathing remove the brace shower and replace brace before getting out of the shower. °If drainage, keep dry dressing and do not bathe the incision, use an moisture impervious dressing. °Please call and return for scheduled follow up appointment 2 weeks from the time of surgery. ° ° °

## 2016-02-28 NOTE — Anesthesia Postprocedure Evaluation (Signed)
Anesthesia Post Note  Patient: [name]  Procedure(s) Performed: Procedure(s) (LRB): Re-do decompressive laminectomies L3-4 and L4-5, Transforaminal lumbar interbody fusion L3-4, L4-5 and L5-S1 with rods, screws and cages, local bone graft, allograft and Vivigen (N/A)  Patient location during evaluation: PACU Anesthesia Type: General Level of consciousness: awake, awake and alert and oriented Pain management: pain level controlled Vital Signs Assessment: post-procedure vital signs reviewed and stable Respiratory status: spontaneous breathing, nonlabored ventilation and respiratory function stable Cardiovascular status: blood pressure returned to baseline Anesthetic complications: no    Last Vitals:  Filed Vitals:   02/28/16 1700 02/28/16 1725  BP:  116/64  Pulse:  99  Temp: 36.9 C   Resp:  16    Last Pain:  Filed Vitals:   02/28/16 1900  PainSc: 6                  Jarmon Javid COKER

## 2016-02-28 NOTE — Transfer of Care (Signed)
Immediate Anesthesia Transfer of Care Note  Patient: [name]  Procedure(s) Performed: Procedure(s): Re-do decompressive laminectomies L3-4 and L4-5, Transforaminal lumbar interbody fusion L3-4, L4-5 and L5-S1 with rods, screws and cages, local bone graft, allograft and Vivigen (N/A)  Patient Location: PACU  Anesthesia Type:General  Level of Consciousness: oriented, sedated and patient cooperative  Airway & Oxygen Therapy: Patient Spontanous Breathing and Patient connected to face mask oxygen  Post-op Assessment: Report given to RN and Post -op Vital signs reviewed and stable  Post vital signs: Reviewed  Last Vitals:  Filed Vitals:   02/28/16 0652 02/28/16 1518  BP: 123/76 107/60  Pulse: 84 97  Temp: 36.4 C   Resp: 18     Last Pain:  Filed Vitals:   02/28/16 1520  PainSc: 7       Patients Stated Pain Goal: 3 ([id number])  Complications: No apparent anesthesia complications

## 2016-02-28 NOTE — Brief Op Note (Signed)
PATIENT ID:      [name]  MRN:     [id number] DOB/AGE:    72-25-1947 / 72 y.o.       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    PROCEDURE:  Procedure(s): Re-do decompressive laminectomies L3-4 and L4-5, Transforaminal lumbar interbody fusion L3-4, L4-5 and L5-S1 with rods, screws and cages, local bone graft, allograft and Vivigen    SURGEON: NITKA,JAMES E   ASSISTANT: Esaw Grandchild          ANESTHESIA:  General supplemented with local anesthetic marcaine 0.5% 1:1 exparel 1.3% total 40cc.  Dr. Leslye Peer. Linna Caprice.  EBL:950CC  DRAINS: Foley to SD.  COMPONENTS:   Implant Name Type Inv. Item Serial No. Manufacturer Lot No. LRB No. Used  SCREW CORTICAL VIPER 7X35 - [id number] Screw SCREW CORTICAL VIPER 7X35  DEPUY SPINE  N/A 5  SCREW VIPER 7MMX30MM CORTICAL - [id number] Screw SCREW VIPER 7MMX30MM CORTICAL  DEPUY SPINE  N/A 2  SCREW CORTICAL VIPER 7X40MM - [id number] Screw SCREW CORTICAL VIPER 7X40MM  DEPUY SPINE  N/A 1  SCREW SET SINGLE INNER - [id number] Screw SCREW SET SINGLE INNER  DEPUY SPINE  N/A 8  BONE CANC CHIPS 20CC - [id number] Bone Implant BONE CANC CHIPS 20CC [id number] [facility]  N/A 1  BONE MATRIX VIVIGEN 5CC - [id number] Bone Implant BONE MATRIX VIVIGEN 5CC [id number] [employer] [place] [employer]  N/A 1  T-PAL 46mm Lordotic    STRYKER SPINE  N/A 1  CAGE CONCORDE BULLET  9X11X27MM - [id number] Cage CAGE CONCORDE BULLET 9X11X27MM  DEPUY SPINE  N/A 1  CAGE BULLET CONCORDE 9X10X27MM - [id number] Cage CAGE BULLET CONCORDE 9X10X27MM  DEPUY SPINE  N/A 1  ROD EXPEDIUM PREBENT 95MM - [id number] Rod ROD EXPEDIUM PREBENT 95MM  DEPUY SPINE  N/A 2  CONNECTOR CROSS A3 SFX [id number] Connector CONNECTOR CROSS A3 SFX   DEPUY SPINE   N/A 1    COMPLICATIONS:  None   CONDITION:  stable    NITKA,JAMES E 02/28/2016, 2:37 PM

## 2016-02-28 NOTE — Progress Notes (Signed)
   02/28/16 2200  Vitals  Temp (!) 102.9 F (39.4 C)  Temp Source Oral  BP 117/77 mmHg  BP Location Right Arm  BP Method Automatic  Patient Position (if appropriate) Lying  Pulse Rate (!) 118  Pulse Rate Source Dinamap  Resp 18  Oxygen Therapy  SpO2 100 %  O2 Device Nasal Cannula  O2 Flow Rate (L/min) 2 L/min   Patient had an episode of emesis, fever with chills and increased heart rate. 4 mg IV Zofran and 650 mg Acetaminophen suppository given. Will continue to monitor closely.

## 2016-02-28 NOTE — Anesthesia Preprocedure Evaluation (Addendum)
Anesthesia Evaluation  Patient identified by MRN, date of birth, ID band Patient awake    Reviewed: Allergy & Precautions, NPO status , Patient's Chart, lab work & pertinent test results  History of Anesthesia Complications Negative for: history of anesthetic complications  Airway Mallampati: II  TM Distance: >3 FB Neck ROM: Full    Dental  (+) Missing, Dental Advisory Given   Pulmonary former smoker,    breath sounds clear to auscultation       Cardiovascular hypertension, Pt. on medications  Rhythm:Regular Rate:Normal     Neuro/Psych negative neurological ROS     GI/Hepatic Neg liver ROS,   Endo/Other  diabetes, Type 2, Oral Hypoglycemic Agents  Renal/GU negative Renal ROS     Musculoskeletal   Abdominal   Peds  Hematology   Anesthesia Other Findings   Reproductive/Obstetrics                            Anesthesia Physical Anesthesia Plan  ASA: II  Anesthesia Plan: General   Post-op Pain Management:    Induction: Intravenous  Airway Management Planned: Oral ETT  Additional Equipment: Arterial line  Intra-op Plan:   Post-operative Plan: Extubation in OR  Informed Consent: I have reviewed the patients History and Physical, chart, labs and discussed the procedure including the risks, benefits and alternatives for the proposed anesthesia with the patient or authorized representative who has indicated his/her understanding and acceptance.   Dental advisory given  Plan Discussed with: CRNA and Anesthesiologist  Anesthesia Plan Comments:         Anesthesia Quick Evaluation

## 2016-02-28 NOTE — Interval H&P Note (Signed)
The patient has been re-examined, and the chart reviewed, and there have been no interval changes to the documented history and physical.    The risks, benefits, and alternatives have been discussed at length, and the patient is willing to proceed.   

## 2016-02-28 NOTE — Anesthesia Procedure Notes (Signed)
Procedure Name: Intubation Date/Time: 02/28/2016 8:00 AM Performed by: Jenne Campus Pre-anesthesia Checklist: Patient identified, Emergency Drugs available, Suction available and Patient being monitored Patient Re-evaluated:Patient Re-evaluated prior to inductionOxygen Delivery Method: Circle System Utilized Preoxygenation: Pre-oxygenation with 100% oxygen Intubation Type: IV induction Ventilation: Mask ventilation without difficulty Grade View: Grade I Tube type: Oral Tube size: 7.0 mm Number of attempts: 1 Airway Equipment and Method: Stylet and Oral airway Placement Confirmation: ETT inserted through vocal cords under direct vision,  positive ETCO2 and breath sounds checked- equal and bilateral Secured at: 22 cm Tube secured with: Tape Dental Injury: Teeth and Oropharynx as per pre-operative assessment  Difficulty Due To: Difficulty was unanticipated and Difficult Airway- due to reduced neck mobility Comments: Smooth IV induction. EZ mask. DL x 1 Mil 2 JRock CRNA. Unable to obtain view. Epiglottis folding up on itself. DL x 1 Mil 2 Dr Tobias Alexander. Epiglottis folding up. Unable to obtain view. Atraumatic intub. Video-glide by [employer]. Grade 1 view. +ETCO2 =bbs.

## 2016-02-29 ENCOUNTER — Inpatient Hospital Stay (HOSPITAL_COMMUNITY): Payer: Medicare Other

## 2016-02-29 DIAGNOSIS — D62 Acute posthemorrhagic anemia: Secondary | ICD-10-CM | POA: Diagnosis not present

## 2016-02-29 LAB — BASIC METABOLIC PANEL
Anion gap: 6 (ref 5–15)
BUN: 12 mg/dL (ref 6–20)
CALCIUM: 7.8 mg/dL — AB (ref 8.9–10.3)
CHLORIDE: 105 mmol/L (ref 101–111)
CO2: 29 mmol/L (ref 22–32)
CREATININE: 1.1 mg/dL (ref 0.61–1.24)
GFR calc non Af Amer: >60 mL/min (ref >60)
GLUCOSE: 113 mg/dL — AB (ref 65–99)
Potassium: 3.8 mmol/L (ref 3.5–5.1)
Sodium: 140 mmol/L (ref 135–145)

## 2016-02-29 LAB — GLUCOSE, CAPILLARY
GLUCOSE-CAPILLARY: 98 mg/dL (ref 65–99)
GLUCOSE-CAPILLARY: 125 mg/dL — AB (ref 65–99)
Glucose-Capillary: 122 mg/dL — ABNORMAL HIGH (ref 65–99)
Glucose-Capillary: 134 mg/dL — ABNORMAL HIGH (ref 65–99)

## 2016-02-29 LAB — CBC
HEMATOCRIT: 26.5 % — AB (ref 39.0–52.0)
HEMOGLOBIN: 9 g/dL — AB (ref 13.0–17.0)
MCH: 31.1 pg (ref 26.0–34.0)
MCHC: 34 g/dL (ref 30.0–36.0)
MCV: 91.7 fL (ref 78.0–100.0)
Platelets: 96 10*3/uL — ABNORMAL LOW (ref 150–400)
RBC: 2.89 MIL/uL — ABNORMAL LOW (ref 4.22–5.81)
RDW: 12.9 % (ref 11.5–15.5)
WBC: 10.9 10*3/uL — ABNORMAL HIGH (ref 4.0–10.5)

## 2016-02-29 LAB — POCT I-STAT 4, (NA,K, GLUC, HGB,HCT)
GLUCOSE: 141 mg/dL — AB (ref 65–99)
HEMATOCRIT: 29 % — AB (ref 39.0–52.0)
Hemoglobin: 9.9 g/dL — ABNORMAL LOW (ref 13.0–17.0)
POTASSIUM: 4.2 mmol/L (ref 3.5–5.1)
Sodium: 137 mmol/L (ref 135–145)

## 2016-02-29 MED ORDER — SODIUM CHLORIDE 0.9 % IV BOLUS (SEPSIS)
500.0000 mL | Freq: Once | INTRAVENOUS | Status: AC
  Administered 2016-02-29: 500 mL via INTRAVENOUS

## 2016-02-29 NOTE — Progress Notes (Signed)
Order of Chest XRay for this morning at 0600 received from Dr Marlou Sa from orthopedic surgery.

## 2016-02-29 NOTE — Progress Notes (Signed)
Interpreter Lesle Chris OT Gaspar Bidding and Caryl Pina Pt and [place] for braces

## 2016-02-29 NOTE — Progress Notes (Signed)
Orthopedic Tech Progress Note Patient Details:  [name] Dec 11, 1945 [id number]  Patient ID: [name], male   DOB: 1945-09-18, 70 y.o.   MRN: [id number] Called in bio-tech brace order; spoke with Ramonita Lab, Kacee Sukhu 02/29/2016, 9:09 AM

## 2016-02-29 NOTE — Care Management Important Message (Signed)
Important Message  Patient Details  Name: [name] MRN: [id number] Date of Birth: 02-Sep-1945   Medicare Important Message Given:  Yes    Loann Quill 02/29/2016, 11:07 AM

## 2016-02-29 NOTE — Evaluation (Addendum)
Occupational Therapy Evaluation Patient Details Name: [name] MRN: [id number] DOB: 1945-09-20 Today's Date: 02/29/2016    History of Present Illness Pt is a 70 yo spanish-speaking male admitted for re-do decompressive lami at L3-4 and L4-5.   Clinical Impression   Patient is s/p L3-4 L4-5 laminectomy surgery resulting in functional limitations due to the deficits listed below (see OT problem list). PTA independent with adls but unable to complete LB dressing now with precautions.  Patient will benefit from skilled OT acutely to increase independence and safety with ADLS to allow discharge HHOT 3n1 tub seat. OT to address LB dressing next session with pending d/c home alone.   Pt requires interpreter for all communication. Pt speaks no english. Pt normally hires a friend that lives near him to translate and drive him to any community based needs ( grocery doctor etc). Pt was unable to state "bed" in [place] with interpreter helping teach patient. [name] provided interpreter services this session. Rn has used phone.      Follow Up Recommendations  Home health OT    Equipment Recommendations  3 in 1 bedside comode;Other (comment);Tub/shower seat (RW, TUB Seat)    Recommendations for Other Services       Precautions / Restrictions Precautions Precautions: Back Precaution Booklet Issued: Yes (comment) (in spanish and Graciella (interpretor) present to explain) Precaution Comments: pt with verbal understanding and able to recall at end of session with exception of twisting Required Braces or Orthoses: Spinal Brace Spinal Brace: Lumbar corset;Applied in sitting position Restrictions Weight Bearing Restrictions: No      Mobility Bed Mobility Overal bed mobility: Needs Assistance Bed Mobility: Rolling;Sidelying to Sit Rolling: Min assist Sidelying to sit: Mod assist       General bed mobility comments: max directional v/c's to adhere to back precautions and for log  roll technique, assist for trunk elevation  Transfers Overall transfer level: Needs assistance Equipment used: Rolling walker (2 wheeled) Transfers: Sit to/from Stand Sit to Stand: Mod assist         General transfer comment: cues for seqence and hand placement    Balance Overall balance assessment: Needs assistance   Sitting balance-Leahy Scale: Fair                                      ADL Overall ADL's : Needs assistance/impaired Eating/Feeding: Independent;Sitting   Grooming: Minimal assistance;Wash/dry hands;Standing       Lower Body Bathing: Total assistance;Sit to/from stand Lower Body Bathing Details (indicate cue type and reason): pt unable to cross bil LE . pt will require AE education. pt lives alone with PRN (A)          Toilet Transfer: Moderate assistance;Ambulation;RW;BSC       Tub/ Shower Transfer: Minimal assistance;Ambulation;Rolling walker Tub/Shower Transfer Details (indicate cue type and reason): pt able to complete transfer but will require seat for safety Functional mobility during ADLs: Min guard;Rolling walker General ADL Comments: pt educated on back precautions and handout reviewed with teach back due to patients inability to read handout. pt provided handout with spanish translations but unable to see words. pt able to see pictures. Pt has a friend that he hires to translate for him and drive him into community. The friend is a retried interpreter from [facility]. Pt able to recall all precautions but twisting. pt reports having grandchildren 20 minutes away that can come help.  Vision     Perception     Praxis      Pertinent Vitals/Pain Pain Assessment: 0-10 Pain Score: 6  Pain Location: operative location Pain Descriptors / Indicators: Discomfort;Operative site guarding Pain Intervention(s): Monitored during session;Premedicated before session;Repositioned     Hand Dominance Right   Extremity/Trunk Assessment  Upper Extremity Assessment Upper Extremity Assessment: Overall WFL for tasks assessed   Lower Extremity Assessment Lower Extremity Assessment: Defer to PT evaluation   Cervical / Trunk Assessment Cervical / Trunk Assessment: Other exceptions (s/p surgery) Cervical / Trunk Exceptions: s/p surg   Communication Communication Communication: Prefers language other than English   Cognition Arousal/Alertness: Awake/alert Behavior During Therapy: WFL for tasks assessed/performed Overall Cognitive Status: Within Functional Limits for tasks assessed                     General Comments       Exercises       Shoulder Instructions      Home Living Family/patient expects to be discharged to:: Private residence Living Arrangements: Alone Available Help at Discharge: Family;Available PRN/intermittently Type of Home: Apartment Home Access: Ramped entrance (vs 1 STE)     Home Layout: One level     Bathroom Shower/Tub: Teacher, early years/pre: Standard     Home Equipment: None   Additional Comments: lives in elderly apartment comples      Prior Functioning/Environment Level of Independence: Independent        Comments: doesn't drive due to poor vision, hires interpretor to take him to MD appointments, grocery store    OT Diagnosis: Generalized weakness;Acute pain   OT Problem List: Decreased strength;Decreased activity tolerance;Impaired balance (sitting and/or standing);Decreased safety awareness;Decreased knowledge of use of DME or AE;Decreased knowledge of precautions;Pain   OT Treatment/Interventions: Self-care/ADL training;Therapeutic exercise;DME and/or AE instruction;Therapeutic activities;Patient/family education;Balance training    OT Goals(Current goals can be found in the care plan section) Acute Rehab OT Goals Patient Stated Goal: didn't state OT Goal Formulation: With patient Time For Goal Achievement: 03/14/16 Potential to Achieve Goals:  Good  OT Frequency: Min 2X/week   Barriers to D/C: Decreased caregiver support  can have PRN from family and interpreter he pays        Co-evaluation              End of Session Equipment Utilized During Treatment: Gait belt;Rolling walker;Back brace Nurse Communication: Mobility status;Precautions  Activity Tolerance: Patient tolerated treatment well Patient left: in chair;with call bell/phone within reach;with chair alarm set   Time: [id number] OT Time Calculation (min): 37 min Charges:  OT General Charges $OT Visit: 1 Procedure OT Evaluation $OT Eval Moderate Complexity: 1 Procedure OT Treatments $Self Care/Home Management : 8-22 mins G-Codes:    Parke Poisson B 03-30-2016, 11:37 AM   Jeri Modena   OTR/L [phone] Office: [phone] .

## 2016-02-29 NOTE — Evaluation (Signed)
Physical Therapy Evaluation Patient Details Name: Pauline Mitro MRN: [id number] DOB: 01/23/1946 Today's Date: 02/29/2016   History of Present Illness  Pt is a 70 yo spanish-speaking male admitted for re-do decompressive lami at L3-4 and L4-5.  Clinical Impression  Patient is s/p above surgery resulting in the deficits listed below (see PT Problem List). Pt tolerated OOB mobility well for first time. Pt requires interpretor for session due to non-english speaking. Pt with good carry over of precautions but needs to work on bed mobility and donning of brace. Patient will benefit from skilled PT to increase their independence and safety with mobility (while adhering to their precautions) to allow discharge to the venue listed below.     Follow Up Recommendations Home health PT;Supervision - Intermittent    Equipment Recommendations  Rolling walker with 5" wheels;3in1 (PT)    Recommendations for Other Services       Precautions / Restrictions Precautions Precautions: Back Precaution Booklet Issued: Yes (comment) (in spanish and Graciella (interpretor) present to explain) Precaution Comments: pt with verbal understanding and able to recall at end of session with exception of twisting Required Braces or Orthoses: Spinal Brace Spinal Brace: Lumbar corset;Applied in sitting position Restrictions Weight Bearing Restrictions: No      Mobility  Bed Mobility Overal bed mobility: Needs Assistance Bed Mobility: Rolling;Sidelying to Sit Rolling: Min assist Sidelying to sit: Mod assist       General bed mobility comments: max directional v/c's to adhere to back precautions and for log roll technique, assist for trunk elevation  Transfers Overall transfer level: Needs assistance Equipment used: Rolling walker (2 wheeled) Transfers: Sit to/from Stand Sit to Stand: Mod assist         General transfer comment: v/c's for safe hand placement, modA to maintain balance during  transition of hands  Ambulation/Gait Ambulation/Gait assistance: Min guard Ambulation Distance (Feet): 175 Feet Assistive device: Rolling walker (2 wheeled) Gait Pattern/deviations: Step-through pattern;Decreased stride length Gait velocity: decreased Gait velocity interpretation: Below normal speed for age/gender General Gait Details: v/c's for erect posture, walker management, pt with poor vision and required v/c's around obstacles  Stairs            Wheelchair Mobility    Modified Rankin (Stroke Patients Only)       Balance Overall balance assessment: Needs assistance (requies RW for safe ambulation at this time)                                           Pertinent Vitals/Pain Pain Assessment: 0-10 Pain Score: 6  Pain Location: surgical site Pain Descriptors / Indicators: Discomfort Pain Intervention(s): Monitored during session    Home Living Family/patient expects to be discharged to:: Private residence Living Arrangements: Alone Available Help at Discharge: Family;Available PRN/intermittently Type of Home: Apartment Home Access: Ramped entrance (vs 1 STE)     Home Layout: One level Home Equipment: None Additional Comments: lives in elderly apartment comples    Prior Function Level of Independence: Independent         Comments: doesn't drive due to poor vision, hires interpretor to take him to MD appointments, grocery store     Hand Dominance   Dominant Hand: Right    Extremity/Trunk Assessment   Upper Extremity Assessment: Overall WFL for tasks assessed           Lower Extremity Assessment: Generalized weakness (due to  recent back surgery)      Cervical / Trunk Assessment: Other exceptions (s/p surgery)  Communication   Communication: Prefers language other than English  Cognition Arousal/Alertness: Awake/alert Behavior During Therapy: WFL for tasks assessed/performed Overall Cognitive Status: Within Functional  Limits for tasks assessed                      General Comments General comments (skin integrity, edema, etc.): incision looks good, minimal drainage    Exercises        Assessment/Plan    PT Assessment Patient needs continued PT services  PT Diagnosis Difficulty walking;Acute pain;Generalized weakness   PT Problem List Decreased strength;Decreased activity tolerance;Decreased balance;Decreased mobility;Decreased coordination  PT Treatment Interventions DME instruction;Gait training;Stair training;Functional mobility training;Therapeutic activities;Therapeutic exercise   PT Goals (Current goals can be found in the Care Plan section) Acute Rehab PT Goals Patient Stated Goal: didn't state PT Goal Formulation: With patient Time For Goal Achievement: 03/07/16 Potential to Achieve Goals: Good    Frequency Min 5X/week   Barriers to discharge Decreased caregiver support lives alone, grandchildren can help    Co-evaluation               End of Session Equipment Utilized During Treatment: Gait belt;Back brace Activity Tolerance: Patient tolerated treatment well Patient left: in chair;with call bell/phone within reach;with chair alarm set Nurse Communication: Mobility status         Time: [id number] PT Time Calculation (min) (ACUTE ONLY): 38 min   Charges:   PT Evaluation $PT Eval Moderate Complexity: 1 Procedure     PT G CodesKingsley Callander 02/29/2016, 10:40 AM   Kittie Plater, PT, DPT Pager #: [phone] Office #: [phone]

## 2016-02-29 NOTE — Progress Notes (Signed)
   02/28/16 2321  Vitals  Temp (!) 101.8 F (38.8 C)  Temp Source Oral  BP (!) 114/46 mmHg  BP Location Right Arm  BP Method Automatic  Patient Position (if appropriate) Lying  Pulse Rate (!) 122  Pulse Rate Source Dinamap  Resp 17  Oxygen Therapy  SpO2 99 %  O2 Device Nasal Cannula  O2 Flow Rate (L/min) 2 L/min   Physician paged.

## 2016-02-29 NOTE — Progress Notes (Signed)
     Subjective: 1 Day Post-Op Procedure(s) (LRB): Re-do decompressive laminectomies L3-4 and L4-5, Transforaminal lumbar interbody fusion L3-4, L4-5 and L5-S1 with rods, screws and cages, local bone graft, allograft and Vivigen (N/A)  Patient reports pain as moderate.    Objective:   VITALS:  Temp:  [98.2 F (36.8 C)-102.9 F (39.4 C)] 100 F (37.8 C) (07/12 1709) Pulse Rate:  [100-122] 118 (07/12 1709) Resp:  [17-18] 18 (07/12 1709) BP: (101-137)/(46-77) 113/63 mmHg (07/12 1709) SpO2:  [99 %-100 %] 100 % (07/12 1709) Awake, alert and oriented x 4. He is in a bedside recline, no dyspnea or orthopnea. Not voiding well, unable to discern if he has voided since this AM.  Neurologically intact ABD soft Neurovascular intact Sensation intact distally Intact pulses distally Dorsiflexion/Plantar flexion intact Incision: scant drainage Abdomen is soft but distended and tympanic. Likely he has an ileus, will change to clear liquids and ice chips and check BMET in AM.    LABS  Recent Labs  02/28/16 1443 02/29/16 0542  HGB 9.9* 9.0*  WBC  --  10.9*  PLT  --  96*    Recent Labs  02/28/16 1443 02/29/16 0542  NA 137 140  K 4.2 3.8  CL  --  105  CO2  --  29  BUN  --  12  CREATININE  --  1.10  GLUCOSE 141* 113*   No results for input(s): LABPT, INR in the last 72 hours.   Assessment/Plan: 1 Day Post-Op Procedure(s) (LRB): Re-do decompressive laminectomies L3-4 and L4-5, Transforaminal lumbar interbody fusion L3-4, L4-5 and L5-S1 with rods, screws and cages, local bone graft, allograft and Vivigen (N/A) Abdomenal distension, ? Possible urinary retension.   Up with therapy  Will check KUB for ileus and continue IVF due to abdomenal distension, likely ileus and may 3rd space fluid due to slow bowel function. Incentive spirometry. Change diet to sips of clear liquids and ice chips, should not push pos if ileus is present. Unclear as to whether he has voided since this  AM. Check bladder scan and if bladder is distended greater than 250 cc post void recommend reinserting foley to straight drain.   [name] E 02/29/2016, 7:53 PM

## 2016-02-29 NOTE — Progress Notes (Addendum)
     Subjective: 1 Day Post-Op Procedure(s) (LRB): Re-do decompressive laminectomies L3-4 and L4-5, Transforaminal lumbar interbody fusion L3-4, L4-5 and L5-S1 with rods, screws and cages, local bone graft, allograft and Vivigen (N/A)Awake, alert and oriented x 4. Sitting up in bed. Moderate discomfort. No brace yet. Speaks mainly [place], please call operator for an interpreter if there is difficulty speaking to this patient.  Patient reports pain as moderate.    Objective:   VITALS:  Temp:  [98.4 F (36.9 C)-102.9 F (39.4 C)] 100.1 F (37.8 C) (07/12 0546) Pulse Rate:  [97-122] 110 (07/12 0546) Resp:  [11-22] 18 (07/12 0546) BP: (101-137)/(46-77) 101/52 mmHg (07/12 0546) SpO2:  [99 %-100 %] 99 % (07/12 0546) Arterial Line BP: (136-161)/(56-76) 156/58 mmHg (07/11 1648)  Neurologically intact ABD soft Neurovascular intact Sensation intact distally Intact pulses distally Dorsiflexion/Plantar flexion intact Incision: scant drainage   LABS  Recent Labs  02/28/16 1443 02/29/16 0542  HGB 9.9* 9.0*  WBC  --  10.9*  PLT  --  PENDING    Recent Labs  02/28/16 1443 02/29/16 0542  NA 137 140  K 4.2 3.8  CL  --  105  CO2  --  29  BUN  --  12  CREATININE  --  1.10  GLUCOSE 141* 113*   No results for input(s): LABPT, INR in the last 72 hours.   Assessment/Plan: 1 Day Post-Op Procedure(s) (LRB): Re-do decompressive laminectomies L3-4 and L4-5, Transforaminal lumbar interbody fusion L3-4, L4-5 and L5-S1 with rods, screws and cages, local bone graft, allograft and Vivigen (N/A) Anemia due to blood loss perioperatively. Low grade fever, CXR is negative for pneumonia, most likely atelectasis and poor inspiratory effort.  Advance diet Up with therapy  Needs brace to begin ambulation. Mild anemia post op due to blood loss from 3 level lumbar fusion surgery. Incentive spirometry today for low grade fever. CXR reviewed, no infiltrate. Will Start PT as soon as a brace is  available for use when out of bed. Fluid bolus for low SBP/DBP and hold antihypertensive agents. Sakina Briones E 02/29/2016, 7:59 AM

## 2016-03-01 LAB — GLUCOSE, CAPILLARY
GLUCOSE-CAPILLARY: 106 mg/dL — AB (ref 65–99)
GLUCOSE-CAPILLARY: 84 mg/dL (ref 65–99)
Glucose-Capillary: 96 mg/dL (ref 65–99)
Glucose-Capillary: 170 mg/dL — ABNORMAL HIGH (ref 65–99)
Glucose-Capillary: 115 mg/dL — ABNORMAL HIGH (ref 65–99)
Glucose-Capillary: 148 mg/dL — ABNORMAL HIGH (ref 65–99)

## 2016-03-01 LAB — CBC WITH DIFFERENTIAL/PLATELET
BASOS ABS: 0 10*3/uL (ref 0.0–0.1)
Basophils Relative: 0 %
EOS PCT: 0 %
Eosinophils Absolute: 0 10*3/uL (ref 0.0–0.7)
HEMATOCRIT: 26.5 % — AB (ref 39.0–52.0)
HEMOGLOBIN: 8.6 g/dL — AB (ref 13.0–17.0)
LYMPHS ABS: 1.1 10*3/uL (ref 0.7–4.0)
LYMPHS PCT: 10 %
MCH: 30.1 pg (ref 26.0–34.0)
MCHC: 32.5 g/dL (ref 30.0–36.0)
MCV: 92.7 fL (ref 78.0–100.0)
MONO ABS: 0.7 10*3/uL (ref 0.1–1.0)
MONOS PCT: 7 %
Neutro Abs: 9.2 10*3/uL — ABNORMAL HIGH (ref 1.7–7.7)
Neutrophils Relative %: 83 %
Platelets: 98 10*3/uL — ABNORMAL LOW (ref 150–400)
RBC: 2.86 MIL/uL — AB (ref 4.22–5.81)
RDW: 13.1 % (ref 11.5–15.5)
WBC: 11 10*3/uL — ABNORMAL HIGH (ref 4.0–10.5)

## 2016-03-01 LAB — BASIC METABOLIC PANEL
ANION GAP: 4 — AB (ref 5–15)
BUN: 9 mg/dL (ref 6–20)
CALCIUM: 7.6 mg/dL — AB (ref 8.9–10.3)
CO2: 27 mmol/L (ref 22–32)
Chloride: 105 mmol/L (ref 101–111)
Creatinine, Ser: 0.86 mg/dL (ref 0.61–1.24)
GLUCOSE: 119 mg/dL — AB (ref 65–99)
POTASSIUM: 3.7 mmol/L (ref 3.5–5.1)
Sodium: 136 mmol/L (ref 135–145)

## 2016-03-01 MED ORDER — TAMSULOSIN HCL 0.4 MG PO CAPS: 0.4000 mg | ORAL_CAPSULE | Freq: Every day | ORAL | Status: DC

## 2016-03-01 MED ORDER — TAMSULOSIN HCL 0.4 MG PO CAPS
0.4000 mg | ORAL_CAPSULE | Freq: Every day | ORAL | Status: DC
Start: 2016-03-01 — End: 2016-03-04
  Administered 2016-03-01 – 2016-03-04 (×4): 0.4 mg via ORAL
  Filled 2016-03-01 (×4): qty 1

## 2016-03-01 MED ORDER — DOCUSATE SODIUM 100 MG PO CAPS: 100.0000 mg | ORAL_CAPSULE | Freq: Two times a day (BID) | ORAL | Status: DC

## 2016-03-01 MED ORDER — GABAPENTIN 100 MG PO CAPS
100.0000 mg | ORAL_CAPSULE | Freq: Two times a day (BID) | ORAL | Status: DC
  Administered 2016-03-01 – 2016-03-04 (×7): 100 mg via ORAL
  Filled 2016-03-01 (×7): qty 1

## 2016-03-01 MED ORDER — OXYCODONE-ACETAMINOPHEN 5-325 MG PO TABS: 1.0000 | ORAL_TABLET | ORAL | Status: DC | PRN

## 2016-03-01 MED ORDER — METOCLOPRAMIDE HCL 5 MG/ML IJ SOLN
5.0000 mg | Freq: Four times a day (QID) | INTRAMUSCULAR | Status: AC
  Administered 2016-03-01 (×4): 5 mg via INTRAVENOUS
  Filled 2016-03-01 (×4): qty 2

## 2016-03-01 NOTE — Progress Notes (Signed)
PT Cancellation Note  Patient Details Name: [name] MRN: [id number] DOB: 10-Jun-1946   Cancelled Treatment:    Reason Eval/Treat Not Completed: Medical issues which prohibited therapy. Arrived in patient's room and pt was in bed shaking and c/o being cold. Nurse tech arrived and took pt's temp. He had a fever of 102. Holding treatment for now. Will check back if time allows.   Benjiman Core, [place] #[phone] 03/01/2016, 3:31 PM

## 2016-03-01 NOTE — Progress Notes (Signed)
Occupational Therapy Treatment Patient Details Name: [name] MRN: [id number] DOB: 1946-08-06 Today's Date: 03/01/2016    History of present illness Pt is a 70 yo spanish-speaking male admitted for re-do decompressive lami at L3-4 and L4-5.   OT comments  Pt with elevated temp but RN requesting pt perform mobility and sit up OOB. Pt reports not feeling well but agreeable to participate in therapy. Pt able to don brace sitting EOB with min assist. Currently requires min guard assist for functional mobility. D/c plan remain appropriate. Will continue to follow acutely.   Follow Up Recommendations  Home health OT    Equipment Recommendations  3 in 1 bedside comode;Other (comment);Tub/shower seat (RW, TUB seat)    Recommendations for Other Services      Precautions / Restrictions Precautions Precautions: Back Precaution Booklet Issued: No Precaution Comments: Reviewed back precautions with pt. Required Braces or Orthoses: Spinal Brace Spinal Brace: Lumbar corset;Applied in sitting position Restrictions Weight Bearing Restrictions: No       Mobility Bed Mobility Overal bed mobility: Needs Assistance Bed Mobility: Rolling;Sidelying to Sit Rolling: Min guard Sidelying to sit: Min guard       General bed mobility comments: VCs for log roll technique and scooting out to EOB.  Transfers Overall transfer level: Needs assistance Equipment used: Rolling walker (2 wheeled) Transfers: Sit to/from Stand Sit to Stand: Min guard         General transfer comment: VCs for hand placement and technique.    Balance Overall balance assessment: Needs assistance Sitting-balance support: Feet supported;No upper extremity supported Sitting balance-Leahy Scale: Good     Standing balance support: Bilateral upper extremity supported Standing balance-Leahy Scale: Poor Standing balance comment: RW for support                   ADL Overall ADL's : Needs  assistance/impaired                 Upper Body Dressing : Minimal assistance;Sitting Upper Body Dressing Details (indicate cue type and reason): to don back brace     Toilet Transfer: Min guard;Ambulation;BSC;RW Toilet Transfer Details (indicate cue type and reason): Simulated by sit to stand from EOB and functional mobility activity.         Functional mobility during ADLs: Min guard;Rolling walker General ADL Comments: Engineer, manufacturing Estill Bamberg) utilized to translate this session. Pt with elevated temperature and RN requesting pt OOB.         Vision                     Perception     Praxis      Cognition   Behavior During Therapy: WFL for tasks assessed/performed Overall Cognitive Status: Within Functional Limits for tasks assessed                       Extremity/Trunk Assessment               Exercises     Shoulder Instructions       General Comments      Pertinent Vitals/ Pain       Pain Assessment: Faces Faces Pain Scale: Hurts even more Pain Location: all over Pain Descriptors / Indicators: Sore;Other (Comment) (Feverish) Pain Intervention(s): Limited activity within patient's tolerance;Monitored during session;Premedicated before session;Repositioned  Home Living Family/patient expects to be discharged to:: Private residence Living Arrangements: Alone  Prior Functioning/Environment              Frequency Min 2X/week     Progress Toward Goals  OT Goals(current goals can now be found in the care plan section)  Progress towards OT goals: Progressing toward goals  Acute Rehab OT Goals Patient Stated Goal: feel better OT Goal Formulation: With patient ADL Goals Pt Will Perform Grooming: standing;with modified independence Pt Will Perform Lower Body Bathing: with modified independence;sit to/from stand (with or without AE) Pt Will Perform Lower Body Dressing: with modified  independence;sit to/from stand (with or without AE) Pt Will Transfer to Toilet: with modified independence;ambulating;bedside commode Pt Will Perform Toileting - Clothing Manipulation and hygiene: with modified independence;sit to/from stand (with or without AE) Additional ADL Goal #1: Pt will independently verbally recall 3/3 back precautions and maintain throughout ADL. Additional ADL Goal #2: Pt will don/doff back brace with set up as precursor for ADLs and functional mobility.  Plan Discharge plan remains appropriate    Co-evaluation                 End of Session Equipment Utilized During Treatment: Gait belt;Rolling walker;Back brace   Activity Tolerance Patient limited by pain   Patient Left in chair;with call bell/phone within reach;with bed alarm set   Nurse Communication Mobility status;Other (comment) (pt up in chair)        Time: [id number] OT Time Calculation (min): 14 min  Charges: OT General Charges $OT Visit: 1 Procedure OT Treatments $Self Care/Home Management : 8-22 mins   Binnie Kand M.S., OTR/L Pager: [phone]  03/01/2016, 5:47 PM

## 2016-03-01 NOTE — Care Management Note (Signed)
Case Management Note  Patient Details  Name: [name] MRN: [id number] Date of Birth: 09/19/1945  Subjective/Objective:                    Action/Plan: CM met with the patient and used interpretor line to go over plans at discharge. Pt has previously selected [facility] for his [facility] services. [facility] with [facility] informed of plan for discharge on Saturday. Pt also ordered a 3 in 1 and walker. Pt states he needs both of these pieces of equipment. Jermaine with [facility] D[facility] notified and will deliver the equipment to the room. Currently patient thinks ambulance transportation would be the best option to get home. CM will follow for transportation needs or further d/c needs.   Expected Discharge Date:                  Expected Discharge Plan:  [place]  In-House Referral:     Discharge planning Services  CM Consult  Post Acute Care Choice:  Durable Medical Equipment, Home Health Choice offered to:  Patient  DME Arranged:  3-N-1, Walker rolling DME Agency:  [place]:  PT, OT [place] Agency:  [facility]  Status of Service:  In process, will continue to follow  If discussed at Long Length of Stay Meetings, dates discussed:    Additional Comments:  Pollie Friar, RN 03/01/2016, 10:30 AM

## 2016-03-01 NOTE — Progress Notes (Addendum)
     Subjective: 2 Days Post-Op Procedure(s) (LRB): Re-do decompressive laminectomies L3-4 and L4-5, Transforaminal lumbar interbody fusion L3-4, L4-5 and L5-S1 with rods, screws and cages, local bone graft, allograft and Vivigen (N/A)  Patient reports pain as mild.    Objective:   VITALS:  Temp:  [98.2 F (36.8 C)-100.2 F (37.9 C)] 99.7 F (37.6 C) (07/13 0700) Pulse Rate:  [97-120] 120 (07/13 0700) Resp:  [17-18] 18 (07/13 0700) BP: (100-131)/(51-67) 131/67 mmHg (07/13 0700) SpO2:  [94 %-100 %] 98 % (07/13 0700) Awake, alert and oriented x 4. Abdomenal bloating, no flatus. Not urinating well, only small amounts, foley last night with 900cc return but not left in place. With 900cc his bladder has been distended and unlikely to be able to contract well. Needs a foley placed and left in to a Reservoir. Urology eval will be requested but leaving foley will decrease chance of obstructive uropathy.  Neurologically intact ABD soft Neurovascular intact Sensation intact distally Intact pulses distally Dorsiflexion/Plantar flexion intact Incision: scant drainage No cellulitis present   LABS  Recent Labs  02/28/16 1443 02/29/16 0542 03/01/16 0407  HGB 9.9* 9.0* 8.6*  WBC  --  10.9* 11.0*  PLT  --  96* 98*    Recent Labs  02/29/16 0542 03/01/16 0407  NA 140 136  K 3.8 3.7  CL 105 105  CO2 29 27  BUN 12 9  CREATININE 1.10 0.86  GLUCOSE 113* 119*   No results for input(s): LABPT, INR in the last 72 hours.   Assessment/Plan: 2 Days Post-Op Procedure(s) (LRB): Re-do decompressive laminectomies L3-4 and L4-5, Transforaminal lumbar interbody fusion L3-4, L4-5 and L5-S1 with rods, screws and cages, local bone graft, allograft and Vivigen (N/A) Low platelet count. Hgb 8.6, was 9.0 will recheck in AM 7/14 Abdomenal distension but no radiographic signs of ileus. Likely slow return of bowel function, due to diabetes, and prolong anesthesia and effect of narcotics and muscle  relaxants.  Up with therapy Continue foley due to acute urinary retention  Need to leave foley in place due to acute bladder distension causing poor bladder contraction and retention due to narcotics and muscle relaxants which he will need for at least 2 weeks. Foley will probably be needed for 2 weeks. C/o of some leg discomfort. Gabapentin will be adjusted.  Need to see return of bowel function before IVF can be discontinued.  Urinary retention will need to be addressed, will start flomax and request urology consult. I will be out of town through this weekend. Please contact [facility] for any patient  Concerns, [phone]. My PA, Benjiman Core will be seeing this patient in my abscence And will assist in his care and discharge plans. Expect that he may be ready for discharge home Saturday 03/03/2016.  Doren Kaspar E 03/01/2016, 7:51 AM

## 2016-03-02 LAB — CBC WITH DIFFERENTIAL/PLATELET
BASOS ABS: 0 10*3/uL (ref 0.0–0.1)
Basophils Relative: 0 %
Eosinophils Absolute: 0.1 10*3/uL (ref 0.0–0.7)
Eosinophils Relative: 1 %
HCT: 23.3 % — ABNORMAL LOW (ref 39.0–52.0)
HEMOGLOBIN: 7.9 g/dL — AB (ref 13.0–17.0)
LYMPHS ABS: 1.2 10*3/uL (ref 0.7–4.0)
LYMPHS PCT: 14 %
MCH: 30.7 pg (ref 26.0–34.0)
MCHC: 33.9 g/dL (ref 30.0–36.0)
MCV: 90.7 fL (ref 78.0–100.0)
Monocytes Absolute: 0.6 10*3/uL (ref 0.1–1.0)
Monocytes Relative: 7 %
NEUTROS PCT: 78 %
Neutro Abs: 6.8 10*3/uL (ref 1.7–7.7)
Platelets: 110 10*3/uL — ABNORMAL LOW (ref 150–400)
RBC: 2.57 MIL/uL — AB (ref 4.22–5.81)
RDW: 12.8 % (ref 11.5–15.5)
WBC: 8.6 10*3/uL (ref 4.0–10.5)

## 2016-03-02 LAB — GLUCOSE, CAPILLARY
GLUCOSE-CAPILLARY: 121 mg/dL — AB (ref 65–99)
GLUCOSE-CAPILLARY: 130 mg/dL — AB (ref 65–99)
GLUCOSE-CAPILLARY: 98 mg/dL (ref 65–99)
GLUCOSE-CAPILLARY: 98 mg/dL (ref 65–99)
Glucose-Capillary: 134 mg/dL — ABNORMAL HIGH (ref 65–99)
Glucose-Capillary: 121 mg/dL — ABNORMAL HIGH (ref 65–99)

## 2016-03-02 LAB — BASIC METABOLIC PANEL
ANION GAP: 5 (ref 5–15)
BUN: 7 mg/dL (ref 6–20)
CALCIUM: 7.7 mg/dL — AB (ref 8.9–10.3)
CO2: 27 mmol/L (ref 22–32)
Chloride: 104 mmol/L (ref 101–111)
Creatinine, Ser: 0.7 mg/dL (ref 0.61–1.24)
GLUCOSE: 128 mg/dL — AB (ref 65–99)
Potassium: 3.2 mmol/L — ABNORMAL LOW (ref 3.5–5.1)
Sodium: 136 mmol/L (ref 135–145)

## 2016-03-02 LAB — URINALYSIS, ROUTINE W REFLEX MICROSCOPIC
BILIRUBIN URINE: NEGATIVE
GLUCOSE, UA: NEGATIVE mg/dL
Ketones, ur: NEGATIVE mg/dL
Leukocytes, UA: NEGATIVE
Nitrite: NEGATIVE
Protein, ur: NEGATIVE mg/dL
SPECIFIC GRAVITY, URINE: 1.008 (ref 1.005–1.030)
pH: 7 (ref 5.0–8.0)

## 2016-03-02 LAB — URINE MICROSCOPIC-ADD ON: SQUAMOUS EPITHELIAL / LPF: NONE SEEN

## 2016-03-02 MED ORDER — HYDROCODONE-ACETAMINOPHEN 5-325 MG PO TABS
1.0000 | ORAL_TABLET | Freq: Four times a day (QID) | ORAL | Status: DC | PRN
  Administered 2016-03-03: 1 via ORAL
  Filled 2016-03-02: qty 1

## 2016-03-02 MED ORDER — HYDROCODONE-ACETAMINOPHEN 5-325 MG PO TABS: 1.0000 | ORAL_TABLET | Freq: Four times a day (QID) | ORAL | Status: DC | PRN

## 2016-03-02 MED ORDER — LISINOPRIL-HYDROCHLOROTHIAZIDE 20-12.5 MG PO TABS: 1.0000 | ORAL_TABLET | Freq: Every day | ORAL | Status: DC

## 2016-03-02 MED ORDER — MAGNESIUM CITRATE PO SOLN
0.5000 | Freq: Once | ORAL | Status: AC
  Administered 2016-03-02: 0.5 via ORAL
  Filled 2016-03-02: qty 296

## 2016-03-02 MED ORDER — FLEET ENEMA 7-19 GM/118ML RE ENEM
1.0000 | ENEMA | Freq: Once | RECTAL | Status: AC
  Administered 2016-03-02: 1 via RECTAL
  Filled 2016-03-02: qty 1

## 2016-03-02 MED ORDER — LISINOPRIL 20 MG PO TABS
20.0000 mg | ORAL_TABLET | Freq: Every day | ORAL | Status: DC
  Administered 2016-03-02 – 2016-03-04 (×3): 20 mg via ORAL
  Filled 2016-03-02 (×2): qty 1

## 2016-03-02 MED ORDER — HYDROCHLOROTHIAZIDE 12.5 MG PO CAPS: 12.5000 mg | ORAL_CAPSULE | Freq: Every day | ORAL | Status: DC

## 2016-03-02 NOTE — Progress Notes (Signed)
Patient ID: [name], male   DOB: 11-03-1945, 70 y.o.   MRN: [id number]   Spoke with Darnelle Bos RN and she reported that patient had a good bowel movement and abd discomfort was better.  RN also stated that patient has been having off and on headaches.  Will restart home lisinopril/hctz.  Check bmet.  Advance diet as tolerated.  Saline lock IV. If patient is stable tomorrow will d/c home.  Dr Lorin Mercy will see in AM.

## 2016-03-02 NOTE — Progress Notes (Signed)
OT NOTE  Occupational Therapy Treatment Patient Details Name: [name] MRN: [id number] DOB: August 23, 1945 Today's Date: 03/02/2016    History of present illness Pt is a 70 yo spanish-speaking male admitted for re-do decompressive lami at L3-4 and L4-5.   OT comments  Pt will requires MOD I for bed mobility prior to d/c home. Pt is unable to get out of the bed without (A) this session. Pt limited by HA at this time. Pt plans to purchase AE for d/c home. Pt used phone interpreter services this session for communication.    Follow Up Recommendations  Home health OT    Equipment Recommendations  3 in 1 bedside comode;Other (comment);Tub/shower seat    Recommendations for Other Services      Precautions / Restrictions Precautions Precautions: Back Required Braces or Orthoses: Spinal Brace Spinal Brace: Lumbar corset;Applied in sitting position         03/02/16 0800  OT Visit Information  Last OT Received On 03/02/16  Assistance Needed +1  History of Present Illness   Precautions  Precautions Back  Precaution Comments back brace  Required Braces or Orthoses Spinal Brace  Spinal Brace Lumbar corset;Applied in sitting position  Pain Assessment  Pain Assessment 0-10  Pain Score 7  Pain Location headache  Pain Descriptors / Indicators Headache  Pain Intervention(s) Monitored during session;Premedicated before session;Repositioned;Patient requesting pain meds-RN notified  Cognition  Arousal/Alertness Awake/alert  Behavior During Therapy WFL for tasks assessed/performed  Overall Cognitive Status Within Functional Limits for tasks assessed  ADL  Overall ADL's  Needs assistance/impaired  Eating/Feeding Independent  Grooming Wash/dry hands;Modified independent  Lower Body Dressing Min guard;Sit to/from stand  Lower Body Dressing Details (indicate cue type and reason) AE education and demonstration  Haematologist Details (indicate cue  type and reason) simulated EOB to chair. pt declined need to use the bathroom  General ADL Comments pt required (A) with bed mobility and educated on LB with AE. Pt plans to purchase AE  Bed Mobility  Overal bed mobility Needs Assistance  Bed Mobility Rolling;Supine to Sit  Rolling Min guard  Supine to sit Mod assist  General bed mobility comments Pt was unable to power up from elbow into sitting. pt requires (A) to elevate trunk  Balance  Overall balance assessment Needs assistance  Sitting-balance support Bilateral upper extremity supported;Feet supported  Sitting balance-Leahy Scale Fair  Transfers  Overall transfer level Needs assistance  Equipment used Rolling walker (2 wheeled)  Transfers Sit to/from Stand  Sit to Stand Min guard  OT - End of Session  Equipment Utilized During Treatment Gait belt;Rolling walker;Back brace  Activity Tolerance Patient tolerated treatment well  Patient left in chair;with call bell/phone within reach;with chair alarm set  Nurse Communication Mobility status;Precautions  OT Assessment/Plan  OT Plan Discharge plan remains appropriate  OT Frequency (ACUTE ONLY) Min 2X/week  Follow Up Recommendations No OT follow up  OT Equipment 3 in 1 bedside comode  OT Goal Progression  Progress towards OT goals Progressing toward goals  Acute Rehab OT Goals  Patient Stated Goal to go home  OT Goal Formulation With patient  Potential to Achieve Goals Good  ADL Goals  Additional ADL Goal #1 pt will complete bed mobility with no rails mod I   OT Time Calculation  OT Start Time (ACUTE ONLY) 0825  OT Stop Time (ACUTE ONLY) 0848  OT Time Calculation (min) 23 min  OT General Charges  $OT Visit 1 Procedure  OT Treatments  $Self Care/Home Management  23-37 mins     Jeri Modena   OTR/L Pager: [phone] Office: [phone] .

## 2016-03-02 NOTE — Progress Notes (Signed)
Physical Therapy Treatment Patient Details Name: [name] MRN: [id number] DOB: 1946-01-25 Today's Date: 03/02/2016    History of Present Illness Pt is a 70 yo spanish-speaking male admitted for re-do decompressive lami at L3-4 and L4-5.    PT Comments    Patient is progressing well toward mobiltiy goals. C/o headache and stomach pain but agreeable to participate in PT. Supervision overall. Current plan remains appropriate.   Follow Up Recommendations  Home health PT;Supervision - Intermittent     Equipment Recommendations  Rolling walker with 5" wheels;3in1 (PT)    Recommendations for Other Services       Precautions / Restrictions Precautions Precautions: Back Precaution Booklet Issued: No Precaution Comments: pt able to recall 3/3 back precautions beginning of session Required Braces or Orthoses: Spinal Brace Spinal Brace: Lumbar corset;Applied in sitting position Restrictions Weight Bearing Restrictions: No    Mobility  Bed Mobility Overal bed mobility: Needs Assistance Bed Mobility: Rolling;Sit to Sidelying Rolling: Supervision       Sit to sidelying: Supervision General bed mobility comments: carry over log roll technique; supervision for safety; cues for technique to scoot up in bed to decrease pain  Transfers Overall transfer level: Needs assistance Equipment used: Rolling walker (2 wheeled) Transfers: Sit to/from Stand Sit to Stand: Supervision         General transfer comment: stood from commode; cues for hand placement for safe descent to EOB; safe use of AD and good technique  Ambulation/Gait Ambulation/Gait assistance: Supervision Ambulation Distance (Feet): 250 Feet Assistive device: Rolling walker (2 wheeled) Gait Pattern/deviations: Step-through pattern;Decreased stride length Gait velocity: decreased   General Gait Details: cues for posture and cadence; safe use of AD   Stairs            Wheelchair Mobility     Modified Rankin (Stroke Patients Only)       Balance   Sitting-balance support: No upper extremity supported;Feet supported Sitting balance-Leahy Scale: Good     Standing balance support: Bilateral upper extremity supported Standing balance-Leahy Scale: Poor                      Cognition Arousal/Alertness: Awake/alert Behavior During Therapy: WFL for tasks assessed/performed Overall Cognitive Status: Within Functional Limits for tasks assessed                      Exercises      General Comments        Pertinent Vitals/Pain Pain Assessment: Faces Pain Score: 7  Faces Pain Scale: Hurts little more Pain Location: HA Pain Descriptors / Indicators: Headache Pain Intervention(s): Monitored during session;Premedicated before session;Repositioned    Home Living                      Prior Function            PT Goals (current goals can now be found in the care plan section) Acute Rehab PT Goals Patient Stated Goal: feel better Progress towards PT goals: Progressing toward goals    Frequency  Min 5X/week    PT Plan Current plan remains appropriate    Co-evaluation             End of Session Equipment Utilized During Treatment: Gait belt;Back brace Activity Tolerance: Patient tolerated treatment well Patient left: in bed;with call bell/phone within reach     Time: 1132-1158 PT Time Calculation (min) (ACUTE ONLY): 26 min  Charges:  $Gait Training: 8-22 mins $  Therapeutic Activity: 8-22 mins                    G Codes:      [place] April, PTA Pager: [phone]   03/02/2016, 12:21 PM

## 2016-03-02 NOTE — Progress Notes (Signed)
Subjective: Pain controlled.  Interpreter [name] present and extremely helpful. Patient has not had a bowel movement while admitted.  Positive flatus. Denies cp, sob, abd pain, nausea, lightheadedness, dizziness. States did well with PT yesterday.     Objective: Vital signs in last 24 hours: Temp:  [98.4 F (36.9 C)-102.7 F (39.3 C)] 99.5 F (37.5 C) (07/14 1037) Pulse Rate:  [107-128] 117 (07/14 1037) Resp:  [16-20] 17 (07/14 1037) BP: (124-155)/(49-76) 155/76 mmHg (07/14 1037) SpO2:  [95 %-100 %] 95 % (07/14 1037)  Intake/Output from previous day: 07/13 0701 - 07/14 0700 In: -  Out: 4700 [Urine:4700] Intake/Output this shift:     Recent Labs  02/28/16 1443 02/29/16 0542 03/01/16 0407 03/02/16 0352  HGB 9.9* 9.0* 8.6* 7.9*    Recent Labs  03/01/16 0407 03/02/16 0352  WBC 11.0* 8.6  RBC 2.86* 2.57*  HCT 26.5* 23.3*  PLT 98* 110*    Recent Labs  02/29/16 0542 03/01/16 0407  NA 140 136  K 3.8 3.7  CL 105 105  CO2 29 27  BUN 12 9  CREATININE 1.10 0.86  GLUCOSE 113* 119*  CALCIUM 7.8* 7.6*   No results for input(s): LABPT, INR in the last 72 hours.  Exam: alert and oriented x 3.  Interpreter present. Wound looks good.  steris intact.  No drainage or signs of infection.  bilat calves nontender, NVI.  No focal motor deficits.  abd round, somewhat distended, nontender.    Assessment/Plan: Low grade fever.  Ordered UA/urine culture.  WBC normal this AM.   Mag citrate and fleet enema now for constipation which may be contributing to fever.   Asymptomatic ABLA as of yesterday.  Will check orthostatic BP now.  May need transfusion 2 units PRBC's today.    Keyuna Cuthrell M 03/02/2016, 11:27 AM

## 2016-03-02 NOTE — Care Management Important Message (Signed)
Important Message  Patient Details  Name: [name] MRN: [id number] Date of Birth: 1945-12-14   Medicare Important Message Given:  Yes    Nathen May 03/02/2016, 11:40 AM

## 2016-03-03 LAB — GLUCOSE, CAPILLARY
GLUCOSE-CAPILLARY: 175 mg/dL — AB (ref 65–99)
GLUCOSE-CAPILLARY: 121 mg/dL — AB (ref 65–99)
Glucose-Capillary: 104 mg/dL — ABNORMAL HIGH (ref 65–99)
Glucose-Capillary: 118 mg/dL — ABNORMAL HIGH (ref 65–99)
Glucose-Capillary: 123 mg/dL — ABNORMAL HIGH (ref 65–99)

## 2016-03-03 LAB — URINE CULTURE: Culture: NO GROWTH

## 2016-03-03 MED ORDER — INSULIN ASPART 100 UNIT/ML ~~LOC~~ SOLN
0.0000 [IU] | Freq: Three times a day (TID) | SUBCUTANEOUS | Status: DC
  Administered 2016-03-04: 5 [IU] via SUBCUTANEOUS

## 2016-03-03 NOTE — Progress Notes (Signed)
CM received call from RN to please arrange for [facility] services.  CM has requested [place] orders and face to face both through RN and on a sticky note to MD.  RN states pt will NOT be discharging today and CM will revisit tomorrow once [facility] orders have been placed.

## 2016-03-03 NOTE — Progress Notes (Addendum)
Occupational Therapy Treatment Patient Details Name: [name] MRN: [id number] DOB: October 29, 1945 Today's Date: 03/03/2016    History of present illness Pt is a 70 yo spanish-speaking male admitted for re-do decompressive lami at L3-4 and L4-5.   OT comments  Pt. Making great gains with skilled OT. Able to complete don/doff of brace, toileting and tub transfers, and demonstrated safe log roll technique for back to bed.  Pt. Plans to purchase A/E kit for use with LB ADLS.  Clear for d/c from acute OT, will alert OTR/L to sign off.    Follow Up Recommendations  Home health OT    Equipment Recommendations  3 in 1 bedside comode;Other (comment);Tub/shower seat    Recommendations for Other Services      Precautions / Restrictions Precautions Precautions: Back Precaution Comments: pt able to recall 3/3 back precautions beginning of session Required Braces or Orthoses: Spinal Brace Spinal Brace: Lumbar corset;Applied in sitting position       Mobility Bed Mobility Overal bed mobility: Modified Independent Bed Mobility: Sit to Supine;Sit to Sidelying Rolling: Modified independent (Device/Increase time)       Sit to sidelying: Modified independent (Device/Increase time) General bed mobility comments: hob flat, no rails, pt. able to perfrom sit to sidelying to rolling on back while maintaining log roll tech. and back precautions without cues  Transfers Overall transfer level: Needs assistance Equipment used: Rolling walker (2 wheeled) Transfers: Sit to/from [facility] Sit to Stand: Supervision Stand pivot transfers: Supervision            Balance                                   ADL Overall ADL's : Needs assistance/impaired                       Lower Body Dressing Details (indicate cue type and reason): educated g. dtrs. on A/E pricing and location for purchase as pt. told them he was interested Toilet Transfer:  Supervision/safety;Ambulation;RW   Toileting- Clothing Manipulation and Hygiene: Supervision/safety;Sit to/from stand   Tub/ Shower Transfer: Tub transfer;Supervision/safety;Ambulation;Rolling walker;Grab bars;3 in 1   Functional mobility during ADLs: Supervision/safety General ADL Comments: pt. able to independently don/doff brace in sitting.  requests to purchase A/E for LB ADLS.  able to side step over tub in/out with S.  plans to use 3n1 to sit on during bathing      Vision                     Perception     Praxis      Cognition   Behavior During Therapy: [facility] for tasks assessed/performed Overall Cognitive Status: Within Functional Limits for tasks assessed                       Extremity/Trunk Assessment               Exercises     Shoulder Instructions       General Comments      Pertinent Vitals/ Pain       Pain Assessment: No/denies pain  Home Living                                          Prior Functioning/Environment  Frequency Min 2X/week     Progress Toward Goals  OT Goals(current goals can now be found in the care plan section)  Progress towards OT goals: Goals met/education completed, patient discharged from [place] Discharge plan remains appropriate    Co-evaluation                 End of Session Equipment Utilized During Treatment: Gait belt;Rolling walker;Back brace   Activity Tolerance Patient tolerated treatment well   Patient Left in bed;with call bell/phone within reach;with family/visitor present;with nursing/sitter in room   Nurse Communication          Time: 5259-1028 OT Time Calculation (min): 13 min  Charges: OT General Charges $OT Visit: 1 Procedure OT Treatments $Self Care/Home Management : 8-22 mins  Janice Coffin, COTA/L 03/03/2016, 11:47 AM

## 2016-03-03 NOTE — Progress Notes (Signed)
Physical Therapy Treatment Patient Details Name: [name] MRN: [id number] DOB: 02/27/1946 Today's Date: 03/03/2016    History of Present Illness Pt is a 70 yo spanish-speaking male admitted for re-do decompressive lami at L3-4 and L4-5.    PT Comments    Patient progressing with transfers and close to goal for gait.  Still recommend HHPT follow up for home safety and follow through with precautions in home environment.   Follow Up Recommendations  Home health PT;Supervision - Intermittent     Equipment Recommendations  Rolling walker with 5" wheels;3in1 (PT)    Recommendations for Other Services       Precautions / Restrictions Precautions Precautions: Back Precaution Comments: donned brace indepe Required Braces or Orthoses: Spinal Brace Spinal Brace: Lumbar corset;Applied in sitting position    Mobility  Bed Mobility Overal bed mobility: Modified Independent Bed Mobility: Sit to Supine;Sit to Sidelying Rolling: Modified independent (Device/Increase time) Sidelying to sit: Modified independent (Device/Increase time)     Sit to sidelying: Modified independent (Device/Increase time) General bed mobility comments: Performed unaided with appropriate technique  Transfers Overall transfer level: Modified independent Equipment used: Rolling walker (2 wheeled) Transfers: Sit to/from Stand Sit to Stand: Modified independent (Device/Increase time) Stand pivot transfers: Supervision       General transfer comment: no cues needed; demonstrated car transfer on chair without armrests and pt performed with cues and good technique  Ambulation/Gait Ambulation/Gait assistance: Supervision Ambulation Distance (Feet): 300 Feet Assistive device: Rolling walker (2 wheeled) Gait Pattern/deviations: Step-through pattern     General Gait Details: assist for safety with walker, tends to take increased time getting around door facing when turning to get back into  room   Stairs            Wheelchair Mobility    Modified Rankin (Stroke Patients Only)       Balance     Sitting balance-Leahy Scale: Good       Standing balance-Leahy Scale: Fair                      Cognition Arousal/Alertness: Awake/alert Behavior During Therapy: WFL for tasks assessed/performed Overall Cognitive Status: Within Functional Limits for tasks assessed                      Exercises      General Comments General comments (skin integrity, edema, etc.): grandaughter present and able to translate for pt (after pt consent); discussed stairs, but pt relates he has a ramp and will not have to negotiate stairs so did not wish to practice      Pertinent Vitals/Pain Pain Assessment: No/denies pain    Home Living                      Prior Function            PT Goals (current goals can now be found in the care plan section) Progress towards PT goals: Progressing toward goals    Frequency  Min 5X/week    PT Plan Current plan remains appropriate    Co-evaluation             End of Session Equipment Utilized During Treatment: Back brace Activity Tolerance: Patient tolerated treatment well Patient left: in bed;with call bell/phone within reach;with family/visitor present (handed off to OT)     [id number] PT Time Calculation (min) (ACUTE ONLY): 24 min  Charges:  $Gait Training: 8-22 mins $Therapeutic Activity:  8-22 mins                    G Codes:      Reginia Naas 03-09-2016, 1:05 PM  [place], [place] March 09, 2016

## 2016-03-03 NOTE — Progress Notes (Signed)
Subjective: 4 Days Post-Op Procedure(s) (LRB): Re-do decompressive laminectomies L3-4 and L4-5, Transforaminal lumbar interbody fusion L3-4, L4-5 and L5-S1 with rods, screws and cages, local bone graft, allograft and Vivigen (N/A) Patient reports pain as moderate.  Hurts when he moves.   Objective: Vital signs in last 24 hours: Temp:  [98.6 F (37 C)-99.5 F (37.5 C)] 99.4 F (37.4 C) (07/15 0516) Pulse Rate:  [111-123] 111 (07/15 0516) Resp:  [16-18] 16 (07/15 0516) BP: (128-155)/(64-76) 128/64 mmHg (07/15 0516) SpO2:  [94 %-99 %] 94 % (07/15 0516)  Intake/Output from previous day: 07/14 0701 - 07/15 0700 In: -  Out: 3450 [Urine:3450] Intake/Output this shift:     Recent Labs  03/01/16 0407 03/02/16 0352  HGB 8.6* 7.9*    Recent Labs  03/01/16 0407 03/02/16 0352  WBC 11.0* 8.6  RBC 2.86* 2.57*  HCT 26.5* 23.3*  PLT 98* 110*    Recent Labs  03/01/16 0407 03/02/16 1809  NA 136 136  K 3.7 3.2*  CL 105 104  CO2 27 27  BUN 9 7  CREATININE 0.86 0.70  GLUCOSE 119* 128*  CALCIUM 7.6* 7.7*   No results for input(s): LABPT, INR in the last 72 hours.  Neurologically intact  Assessment/Plan: 4 Days Post-Op Procedure(s) (LRB): Re-do decompressive laminectomies L3-4 and L4-5, Transforaminal lumbar interbody fusion L3-4, L4-5 and L5-S1 with rods, screws and cages, local bone graft, allograft and Vivigen (N/A) Up with therapy hopefully home tomorrow.   Tyjanae Bartek C 03/03/2016, 9:54 AM

## 2016-03-04 LAB — GLUCOSE, CAPILLARY
GLUCOSE-CAPILLARY: 217 mg/dL — AB (ref 65–99)
Glucose-Capillary: 118 mg/dL — ABNORMAL HIGH (ref 65–99)

## 2016-03-04 NOTE — Progress Notes (Signed)
Physical Therapy Treatment Patient Details Name: Lenardo Guntrum MRN: [id number] DOB: Jan 15, 1946 Today's Date: Mar 29, 2016    History of Present Illness Pt is a 70 yo spanish-speaking male admitted for re-do decompressive lami at L3-4 and L4-5.    PT Comments    Patient progressing close to goal status, but feel follow up HHPT indicated for home safety, walker management and continued precaution education.  Follow Up Recommendations  Home health PT;Supervision - Intermittent     Equipment Recommendations  Rolling walker with 5" wheels;3in1 (PT)    Recommendations for Other Services       Precautions / Restrictions Precautions Precautions: Back Required Braces or Orthoses: Spinal Brace Spinal Brace: Lumbar corset;Applied in sitting position    Mobility  Bed Mobility Overal bed mobility: Modified Independent                Transfers Overall transfer level: Modified independent Equipment used: Rolling walker (2 wheeled)                Ambulation/Gait Ambulation/Gait assistance: Supervision Ambulation Distance (Feet): 400 Feet Assistive device: Rolling walker (2 wheeled) Gait Pattern/deviations: Step-through pattern;Decreased stride length     General Gait Details: in the room left walker and picked up foley to step to chair with close S/minguard for safety   Stairs            Wheelchair Mobility    Modified Rankin (Stroke Patients Only)       Balance Overall balance assessment: Needs assistance           Standing balance-Leahy Scale: Fair                      Cognition Arousal/Alertness: Awake/alert Behavior During Therapy: WFL for tasks assessed/performed Overall Cognitive Status: Within Functional Limits for tasks assessed                      Exercises      General Comments General comments (skin integrity, edema, etc.): no family present, but pt following gesturing cues and communicating sparingly in his  native language      Pertinent Vitals/Pain Faces Pain Scale: Hurts a little bit Pain Location: back Pain Descriptors / Indicators: Sore Pain Intervention(s): Monitored during session    Home Living                      Prior Function            PT Goals (current goals can now be found in the care plan section) Progress towards PT goals: Progressing toward goals    Frequency  Min 5X/week    PT Plan Current plan remains appropriate    Co-evaluation             End of Session Equipment Utilized During Treatment: Back brace Activity Tolerance: Patient tolerated treatment well Patient left: in chair;with call bell/phone within reach;with chair alarm set     Time: [id number] PT Time Calculation (min) (ACUTE ONLY): 12 min  Charges:  $Gait Training: 8-22 mins                    G Codes:      Reginia Naas Mar 29, 2016, 1:24 PM  Magda Kiel, [place] 03/29/16

## 2016-03-04 NOTE — Progress Notes (Addendum)
Patient ready for discharge to home; equipment taken home earlier this week; granddaughter here to accompany patient home; [place] arranged; patient discharging home with foley cath.;changed to leg bag for home use; discharge instructions given and reviewed with patient and granddaughter; Rx's given; patient discharged out via wheelchair.

## 2016-03-04 NOTE — Progress Notes (Signed)
Subjective: 5 Days Post-Op Procedure(s) (LRB): Re-do decompressive laminectomies L3-4 and L4-5, Transforaminal lumbar interbody fusion L3-4, L4-5 and L5-S1 with rods, screws and cages, local bone graft, allograft and Vivigen (N/A) Patient reports pain as mild.    Objective: Vital signs in last 24 hours: Temp:  [98.8 F (37.1 C)-99.7 F (37.6 C)] 98.8 F (37.1 C) (07/16 0948) Pulse Rate:  [102-120] 109 (07/16 1042) Resp:  [16-18] 18 (07/16 1042) BP: (104-137)/(49-71) 137/69 mmHg (07/16 1042) SpO2:  [96 %-100 %] 96 % (07/16 1042)  Intake/Output from previous day: 07/15 0701 - 07/16 0700 In: 360 [P.O.:360] Out: 3300 [Urine:3300] Intake/Output this shift: Total I/O In: -  Out: 700 [Urine:700]   Recent Labs  03/02/16 0352  HGB 7.9*    Recent Labs  03/02/16 0352  WBC 8.6  RBC 2.57*  HCT 23.3*  PLT 110*    Recent Labs  03/02/16 1809  NA 136  K 3.2*  CL 104  CO2 27  BUN 7  CREATININE 0.70  GLUCOSE 128*  CALCIUM 7.7*   No results for input(s): LABPT, INR in the last 72 hours.  Neurologically intact  Assessment/Plan: 5 Days Post-Op Procedure(s) (LRB): Re-do decompressive laminectomies L3-4 and L4-5, Transforaminal lumbar interbody fusion L3-4, L4-5 and L5-S1 with rods, screws and cages, local bone graft, allograft and Vivigen (N/A) Discharge home with home health  [name] C 03/04/2016, 11:03 AM

## 2016-03-04 NOTE — Care Management Note (Signed)
Case Management Note  Patient Details  Name: [name] MRN: [id number] Date of Birth: 1946-06-09  Subjective/Objective:                   Re-do decompressive laminectomies L3-4 and L4-5, Transforaminal lumbar interbody fusion L3-4, L4-5 and L5-S1 with rods, screws and cages, local bone graft, allograft and Vivigen (N/A Action/Plan: Discharge planning Expected Discharge Date:  03/04/16               Expected Discharge Plan:  [place]  In-House Referral:     Discharge planning Services  CM Consult  Post Acute Care Choice:  Durable Medical Equipment, Home Health Choice offered to:  Patient  DME Arranged:  3-N-1, Walker rolling DME Agency:  [place]:  PT, RN [place] Agency:  [facility] (formerly [facility]), [facility]now [facility])  Status of Service:  Completed, signed off  If discussed at [employer], dates discussed:    Additional Comments: CM received call HHPT/RN orders have been placed with [facility].  CM notified [facility] rep, Tommi Emery (as pt has already been set up with this home health agency).  Cm called [facility] DME rep, Germaine to please deliver the 3n1 and rolling walker to room so pt can discharge.  NO other CM needs were communicated. Dellie Catholic, RN 03/04/2016, 11:30 AM

## 2016-03-06 DIAGNOSIS — M4806 Spinal stenosis, lumbar region: Secondary | ICD-10-CM | POA: Diagnosis not present

## 2016-03-06 DIAGNOSIS — E119 Type 2 diabetes mellitus without complications: Secondary | ICD-10-CM | POA: Diagnosis not present

## 2016-03-06 DIAGNOSIS — M5137 Other intervertebral disc degeneration, lumbosacral region: Secondary | ICD-10-CM | POA: Diagnosis not present

## 2016-03-06 DIAGNOSIS — Z4789 Encounter for other orthopedic aftercare: Secondary | ICD-10-CM | POA: Diagnosis not present

## 2016-03-06 DIAGNOSIS — M4716 Other spondylosis with myelopathy, lumbar region: Secondary | ICD-10-CM | POA: Diagnosis not present

## 2016-03-06 DIAGNOSIS — I10 Essential (primary) hypertension: Secondary | ICD-10-CM | POA: Diagnosis not present

## 2016-03-07 DIAGNOSIS — I10 Essential (primary) hypertension: Secondary | ICD-10-CM | POA: Diagnosis not present

## 2016-03-07 DIAGNOSIS — Z4789 Encounter for other orthopedic aftercare: Secondary | ICD-10-CM | POA: Diagnosis not present

## 2016-03-07 DIAGNOSIS — E119 Type 2 diabetes mellitus without complications: Secondary | ICD-10-CM | POA: Diagnosis not present

## 2016-03-07 DIAGNOSIS — M5137 Other intervertebral disc degeneration, lumbosacral region: Secondary | ICD-10-CM | POA: Diagnosis not present

## 2016-03-07 DIAGNOSIS — M4806 Spinal stenosis, lumbar region: Secondary | ICD-10-CM | POA: Diagnosis not present

## 2016-03-07 DIAGNOSIS — R338 Other retention of urine: Secondary | ICD-10-CM | POA: Diagnosis not present

## 2016-03-07 DIAGNOSIS — M4716 Other spondylosis with myelopathy, lumbar region: Secondary | ICD-10-CM | POA: Diagnosis not present

## 2016-03-08 DIAGNOSIS — M4716 Other spondylosis with myelopathy, lumbar region: Secondary | ICD-10-CM | POA: Diagnosis not present

## 2016-03-08 DIAGNOSIS — Z4789 Encounter for other orthopedic aftercare: Secondary | ICD-10-CM | POA: Diagnosis not present

## 2016-03-08 DIAGNOSIS — I10 Essential (primary) hypertension: Secondary | ICD-10-CM | POA: Diagnosis not present

## 2016-03-08 DIAGNOSIS — E119 Type 2 diabetes mellitus without complications: Secondary | ICD-10-CM | POA: Diagnosis not present

## 2016-03-08 DIAGNOSIS — M4806 Spinal stenosis, lumbar region: Secondary | ICD-10-CM | POA: Diagnosis not present

## 2016-03-08 DIAGNOSIS — M5137 Other intervertebral disc degeneration, lumbosacral region: Secondary | ICD-10-CM | POA: Diagnosis not present

## 2016-03-09 DIAGNOSIS — E119 Type 2 diabetes mellitus without complications: Secondary | ICD-10-CM | POA: Diagnosis not present

## 2016-03-09 DIAGNOSIS — Z4789 Encounter for other orthopedic aftercare: Secondary | ICD-10-CM | POA: Diagnosis not present

## 2016-03-09 DIAGNOSIS — I10 Essential (primary) hypertension: Secondary | ICD-10-CM | POA: Diagnosis not present

## 2016-03-09 DIAGNOSIS — M5137 Other intervertebral disc degeneration, lumbosacral region: Secondary | ICD-10-CM | POA: Diagnosis not present

## 2016-03-09 DIAGNOSIS — M4806 Spinal stenosis, lumbar region: Secondary | ICD-10-CM | POA: Diagnosis not present

## 2016-03-09 DIAGNOSIS — M4716 Other spondylosis with myelopathy, lumbar region: Secondary | ICD-10-CM | POA: Diagnosis not present

## 2016-03-12 DIAGNOSIS — E119 Type 2 diabetes mellitus without complications: Secondary | ICD-10-CM | POA: Diagnosis not present

## 2016-03-12 DIAGNOSIS — M4806 Spinal stenosis, lumbar region: Secondary | ICD-10-CM | POA: Diagnosis not present

## 2016-03-12 DIAGNOSIS — M4716 Other spondylosis with myelopathy, lumbar region: Secondary | ICD-10-CM | POA: Diagnosis not present

## 2016-03-12 DIAGNOSIS — M5137 Other intervertebral disc degeneration, lumbosacral region: Secondary | ICD-10-CM | POA: Diagnosis not present

## 2016-03-12 DIAGNOSIS — I10 Essential (primary) hypertension: Secondary | ICD-10-CM | POA: Diagnosis not present

## 2016-03-12 DIAGNOSIS — Z4789 Encounter for other orthopedic aftercare: Secondary | ICD-10-CM | POA: Diagnosis not present

## 2016-03-13 DIAGNOSIS — M5137 Other intervertebral disc degeneration, lumbosacral region: Secondary | ICD-10-CM | POA: Diagnosis not present

## 2016-03-13 DIAGNOSIS — I10 Essential (primary) hypertension: Secondary | ICD-10-CM | POA: Diagnosis not present

## 2016-03-13 DIAGNOSIS — E119 Type 2 diabetes mellitus without complications: Secondary | ICD-10-CM | POA: Diagnosis not present

## 2016-03-13 DIAGNOSIS — M4806 Spinal stenosis, lumbar region: Secondary | ICD-10-CM | POA: Diagnosis not present

## 2016-03-13 DIAGNOSIS — M4716 Other spondylosis with myelopathy, lumbar region: Secondary | ICD-10-CM | POA: Diagnosis not present

## 2016-03-13 DIAGNOSIS — Z4789 Encounter for other orthopedic aftercare: Secondary | ICD-10-CM | POA: Diagnosis not present

## 2016-03-14 DIAGNOSIS — Z4789 Encounter for other orthopedic aftercare: Secondary | ICD-10-CM | POA: Diagnosis not present

## 2016-03-14 DIAGNOSIS — R338 Other retention of urine: Secondary | ICD-10-CM | POA: Diagnosis not present

## 2016-03-14 DIAGNOSIS — M4716 Other spondylosis with myelopathy, lumbar region: Secondary | ICD-10-CM | POA: Diagnosis not present

## 2016-03-14 DIAGNOSIS — E119 Type 2 diabetes mellitus without complications: Secondary | ICD-10-CM | POA: Diagnosis not present

## 2016-03-14 DIAGNOSIS — M5137 Other intervertebral disc degeneration, lumbosacral region: Secondary | ICD-10-CM | POA: Diagnosis not present

## 2016-03-14 DIAGNOSIS — I10 Essential (primary) hypertension: Secondary | ICD-10-CM | POA: Diagnosis not present

## 2016-03-14 DIAGNOSIS — M4806 Spinal stenosis, lumbar region: Secondary | ICD-10-CM | POA: Diagnosis not present

## 2016-03-15 DIAGNOSIS — E119 Type 2 diabetes mellitus without complications: Secondary | ICD-10-CM | POA: Diagnosis not present

## 2016-03-15 DIAGNOSIS — I10 Essential (primary) hypertension: Secondary | ICD-10-CM | POA: Diagnosis not present

## 2016-03-15 DIAGNOSIS — Z4789 Encounter for other orthopedic aftercare: Secondary | ICD-10-CM | POA: Diagnosis not present

## 2016-03-15 DIAGNOSIS — M5137 Other intervertebral disc degeneration, lumbosacral region: Secondary | ICD-10-CM | POA: Diagnosis not present

## 2016-03-15 DIAGNOSIS — M4806 Spinal stenosis, lumbar region: Secondary | ICD-10-CM | POA: Diagnosis not present

## 2016-03-15 DIAGNOSIS — M4716 Other spondylosis with myelopathy, lumbar region: Secondary | ICD-10-CM | POA: Diagnosis not present

## 2016-03-19 DIAGNOSIS — Z4789 Encounter for other orthopedic aftercare: Secondary | ICD-10-CM | POA: Diagnosis not present

## 2016-03-19 DIAGNOSIS — M4806 Spinal stenosis, lumbar region: Secondary | ICD-10-CM | POA: Diagnosis not present

## 2016-03-19 DIAGNOSIS — M5137 Other intervertebral disc degeneration, lumbosacral region: Secondary | ICD-10-CM | POA: Diagnosis not present

## 2016-03-19 DIAGNOSIS — I10 Essential (primary) hypertension: Secondary | ICD-10-CM | POA: Diagnosis not present

## 2016-03-19 DIAGNOSIS — E119 Type 2 diabetes mellitus without complications: Secondary | ICD-10-CM | POA: Diagnosis not present

## 2016-03-19 DIAGNOSIS — M4716 Other spondylosis with myelopathy, lumbar region: Secondary | ICD-10-CM | POA: Diagnosis not present

## 2016-03-20 DIAGNOSIS — E119 Type 2 diabetes mellitus without complications: Secondary | ICD-10-CM | POA: Diagnosis not present

## 2016-03-20 DIAGNOSIS — M4716 Other spondylosis with myelopathy, lumbar region: Secondary | ICD-10-CM | POA: Diagnosis not present

## 2016-03-20 DIAGNOSIS — M4806 Spinal stenosis, lumbar region: Secondary | ICD-10-CM | POA: Diagnosis not present

## 2016-03-20 DIAGNOSIS — M5137 Other intervertebral disc degeneration, lumbosacral region: Secondary | ICD-10-CM | POA: Diagnosis not present

## 2016-03-20 DIAGNOSIS — Z4789 Encounter for other orthopedic aftercare: Secondary | ICD-10-CM | POA: Diagnosis not present

## 2016-03-20 DIAGNOSIS — I10 Essential (primary) hypertension: Secondary | ICD-10-CM | POA: Diagnosis not present

## 2016-03-21 DIAGNOSIS — E119 Type 2 diabetes mellitus without complications: Secondary | ICD-10-CM | POA: Diagnosis not present

## 2016-03-21 DIAGNOSIS — R8271 Bacteriuria: Secondary | ICD-10-CM | POA: Diagnosis not present

## 2016-03-21 DIAGNOSIS — Z4789 Encounter for other orthopedic aftercare: Secondary | ICD-10-CM | POA: Diagnosis not present

## 2016-03-21 DIAGNOSIS — M4716 Other spondylosis with myelopathy, lumbar region: Secondary | ICD-10-CM | POA: Diagnosis not present

## 2016-03-21 DIAGNOSIS — R338 Other retention of urine: Secondary | ICD-10-CM | POA: Diagnosis not present

## 2016-03-21 DIAGNOSIS — M5137 Other intervertebral disc degeneration, lumbosacral region: Secondary | ICD-10-CM | POA: Diagnosis not present

## 2016-03-21 DIAGNOSIS — M4806 Spinal stenosis, lumbar region: Secondary | ICD-10-CM | POA: Diagnosis not present

## 2016-03-21 DIAGNOSIS — I10 Essential (primary) hypertension: Secondary | ICD-10-CM | POA: Diagnosis not present

## 2016-03-22 DIAGNOSIS — I10 Essential (primary) hypertension: Secondary | ICD-10-CM | POA: Diagnosis not present

## 2016-03-22 DIAGNOSIS — M4806 Spinal stenosis, lumbar region: Secondary | ICD-10-CM | POA: Diagnosis not present

## 2016-03-22 DIAGNOSIS — E119 Type 2 diabetes mellitus without complications: Secondary | ICD-10-CM | POA: Diagnosis not present

## 2016-03-22 DIAGNOSIS — M4716 Other spondylosis with myelopathy, lumbar region: Secondary | ICD-10-CM | POA: Diagnosis not present

## 2016-03-22 DIAGNOSIS — Z4789 Encounter for other orthopedic aftercare: Secondary | ICD-10-CM | POA: Diagnosis not present

## 2016-03-22 DIAGNOSIS — M5137 Other intervertebral disc degeneration, lumbosacral region: Secondary | ICD-10-CM | POA: Diagnosis not present

## 2016-03-22 NOTE — Discharge Summary (Signed)
Patient ID: [name] MRN: [id number] DOB/AGE: Apr 03, 1946 70 y.o.  Admit date: 02/28/2016 Discharge date: 03/22/2016  Admission Diagnoses:  Principal Problem:   Spinal stenosis, lumbar region, with neurogenic claudication Active Problems:   Degenerative disc disease, lumbar   Lumbar spondylosis with myelopathy   Lumbar spondylosis   Postoperative anemia due to acute blood loss   Discharge Diagnoses:  Principal Problem:   Spinal stenosis, lumbar region, with neurogenic claudication Active Problems:   Degenerative disc disease, lumbar   Lumbar spondylosis with myelopathy   Lumbar spondylosis   Postoperative anemia due to acute blood loss  status post Procedure(s): Re-do decompressive laminectomies L3-4 and L4-5, Transforaminal lumbar interbody fusion L3-4, L4-5 and L5-S1 with rods, screws and cages, local bone graft, allograft and Vivigen  Past Medical History:  Diagnosis Date  . Anemia   . Anxiety   . Diabetes mellitus without complication ([place])   . Diverticulosis of colon (without mention of hemorrhage)   . GERD (gastroesophageal reflux disease)   . Hypertension     Surgeries: Procedure(s): Re-do decompressive laminectomies L3-4 and L4-5, Transforaminal lumbar interbody fusion L3-4, L4-5 and L5-S1 with rods, screws and cages, local bone graft, allograft and Vivigen on 02/28/2016   Consultants:   Discharged Condition: Improved  Hospital Course: [name] is an 70 y.o. male who was admitted 02/28/2016 for operative treatment of Spinal stenosis, lumbar region, with neurogenic claudication. Patient failed conservative treatments (please see the history and physical for the specifics) and had severe unremitting pain that affects sleep, daily activities and work/hobbies. After pre-op clearance, the patient was taken to the operating room on 02/28/2016 and underwent  Procedure(s): Re-do decompressive laminectomies L3-4 and L4-5, Transforaminal lumbar interbody  fusion L3-4, L4-5 and L5-S1 with rods, screws and cages, local bone graft, allograft and Vivigen.    Patient was given perioperative antibiotics:  Anti-infectives    Start     Dose/Rate Route Frequency Ordered Stop   02/28/16 2200  ceFAZolin (ANCEF) IVPB 1 g/50 mL premix     1 g 100 mL/hr over 30 Minutes Intravenous Every 8 hours 02/28/16 1729 02/29/16 0545   02/28/16 0600  ceFAZolin (ANCEF) IVPB 2g/100 mL premix     2 g 200 mL/hr over 30 Minutes Intravenous On call to O.R. 02/27/16 1435 02/28/16 1400       Patient was given sequential compression devices and early ambulation to prevent DVT.   Patient benefited maximally from hospital stay and there were no complications. At the time of discharge, the patient was urinating/moving their bowels without difficulty, tolerating a regular diet, pain is controlled with oral pain medications and they have been cleared by PT/OT.   Recent vital signs: No data found.    Recent laboratory studies: No results for input(s): WBC, HGB, HCT, PLT, NA, K, CL, CO2, BUN, CREATININE, GLUCOSE, INR, CALCIUM in the last 72 hours.  Invalid input(s): PT, 2   Discharge Medications:     Medication List    STOP taking these medications   HYDROcodone-acetaminophen 7.5-325 MG tablet Commonly known as:  NORCO Replaced by:  HYDROcodone-acetaminophen 5-325 MG tablet   lisinopril-hydrochlorothiazide 20-12.5 MG tablet Commonly known as:  PRINZIDE,ZESTORETIC     TAKE these medications   docusate sodium 100 MG capsule Commonly known as:  COLACE Take 1 capsule (100 mg total) by mouth 2 (two) times daily.   HYDROcodone-acetaminophen 5-325 MG tablet Commonly known as:  NORCO/VICODIN Take 1 tablet by mouth every 6 (six) hours as needed for moderate pain.  Replaces:  HYDROcodone-acetaminophen 7.5-325 MG tablet   metFORMIN 500 MG tablet Commonly known as:  GLUCOPHAGE Take 500 mg by mouth 2 (two) times daily with a meal.   omeprazole 20 MG capsule Commonly  known as:  PRILOSEC Take 20 mg by mouth daily.   tamsulosin 0.4 MG Caps capsule Commonly known as:  FLOMAX Take 1 capsule (0.4 mg total) by mouth daily after breakfast.       Diagnostic Studies: Dg Chest 2 View  Result Date: 02/22/2016 CLINICAL DATA:  Pre admission for lumbar fusion. EXAM: CHEST  2 VIEW COMPARISON:  09/03/2011 FINDINGS: Normal heart size. There is no pleural effusion or edema. No airspace consolidation. Chronic interstitial coarsening is noted throughout both lungs. IMPRESSION: 1. No acute findings. 2. Chronic interstitial coarsening noted bilaterally. Electronically Signed   By: Kerby Moors M.D.   On: 02/22/2016 09:57   Dg Lumbar Spine Complete  Result Date: 02/28/2016 CLINICAL DATA:  Decompressive laminectomy L3-4 and L4-5 EXAM: LUMBAR SPINE - COMPLETE 4+ VIEW; DG C-ARM GT 120 MIN COMPARISON:  None FLUOROSCOPY TIME:  Time 2 minutes 18 seconds FINDINGS: Posterior lumbar interbody fusion and decompression from L3 through S1 without failure or complication. Vertebral body heights are maintained. IMPRESSION: Interval posterior lumbar interbody fusion from L3 through S1. Electronically Signed   By: Kathreen Devoid   On: 02/28/2016 15:58   Dg Chest Port 1 View  Result Date: 02/29/2016 CLINICAL DATA:  Fever and tachycardia EXAM: PORTABLE CHEST 1 VIEW COMPARISON:  February 22, 2016 FINDINGS: There is mild atelectatic change in the left base. Lungs elsewhere clear. Heart is borderline prominent with pulmonary venous hypertension. No adenopathy evident. No bone lesions. IMPRESSION: Evidence of pulmonary vascular congestion. Mild left base atelectasis. No airspace consolidation. Electronically Signed   By: Lowella Grip III M.D.   On: 02/29/2016 07:58   Dg Abd Portable 1v  Result Date: 02/29/2016 CLINICAL DATA:  Swelling of the abdomen.  Evaluate for ileus. EXAM: PORTABLE ABDOMEN - 1 VIEW COMPARISON:  None. FINDINGS: There is a moderate amount of stool in the ascending colon. There is  no bowel dilatation to suggest obstruction. There is no evidence of pneumoperitoneum, portal venous gas or pneumatosis. There are no pathologic calcifications along the expected course of the ureters. There is posterior spinal fusion from L3 through S1. IMPRESSION: No bowel obstruction. Electronically Signed   By: Kathreen Devoid   On: 02/29/2016 20:38   Dg C-arm Gt 120 Min  Result Date: 02/28/2016 CLINICAL DATA:  Decompressive laminectomy L3-4 and L4-5 EXAM: LUMBAR SPINE - COMPLETE 4+ VIEW; DG C-ARM GT 120 MIN COMPARISON:  None FLUOROSCOPY TIME:  Time 2 minutes 18 seconds FINDINGS: Posterior lumbar interbody fusion and decompression from L3 through S1 without failure or complication. Vertebral body heights are maintained. IMPRESSION: Interval posterior lumbar interbody fusion from L3 through S1. Electronically Signed   By: Kathreen Devoid   On: 02/28/2016 15:58    Discharge Instructions    Call MD / Call 911    Complete by:  As directed   If you experience chest pain or shortness of breath, CALL 911 and be transported to the hospital emergency room.  If you develope a fever above 101 F, pus (white drainage) or increased drainage or redness at the wound, or calf pain, call your surgeon's office.   Constipation Prevention    Complete by:  As directed   Drink plenty of fluids.  Prune juice may be helpful.  You may use a stool softener,  such as Colace (over the counter) 100 mg twice a day.  Use MiraLax (over the counter) for constipation as needed.   Diet - low sodium heart healthy    Complete by:  As directed   Diet Carb Modified    Complete by:  As directed   Discharge instructions    Complete by:  As directed   Call if there is increasing drainage, fever greater than 101.5, severe head aches, and worsening nausea or light sensitivity. If shortness of breath, bloody cough or chest tightness or pain go to an emergency room. No lifting greater than 10 lbs. Avoid bending, stooping and twisting. Use brace  when sitting and out of bed even to go to bathroom. Walk in house for first 2 weeks then may start to get out slowly increasing distances up to one quarter mile by 4-6 weeks post op. After 5 days may shower and change dressing following bathing with shower.When bathing remove the brace shower and replace brace before getting out of the shower. If drainage, keep dry dressing and do not bathe the incision, use an moisture impervious dressing. Please call and return for scheduled follow up appointment 2 weeks from the time of surgery.   Driving restrictions    Complete by:  As directed   No driving.   Increase activity slowly as tolerated    Complete by:  As directed   Lifting restrictions    Complete by:  As directed   No lifting for 12 weeks      Follow-up Information    NITKA,Gustavo Dispenza E, MD In 2 weeks.   Specialty:  Orthopedic Surgery Why:  For wound re-check Contact information: [place] [address] [phone]        Nicolette Bang, MD. Call in 1 week.   Specialty:  Urology Why:  voiding trial Contact information: [place] [address] [phone]        [facility].   Why:  home health services Contact information: [address] [place] [address] [phone]        [facility].   Why:  rolling walker and 3n1 Contact information: [place] [address] [phone]           Discharge Plan:  discharge to home  Disposition:     Signed: Lanae Crumbly  03/22/2016, 10:48 AM

## 2016-03-26 DIAGNOSIS — I10 Essential (primary) hypertension: Secondary | ICD-10-CM | POA: Diagnosis not present

## 2016-03-26 DIAGNOSIS — M4716 Other spondylosis with myelopathy, lumbar region: Secondary | ICD-10-CM | POA: Diagnosis not present

## 2016-03-26 DIAGNOSIS — M5137 Other intervertebral disc degeneration, lumbosacral region: Secondary | ICD-10-CM | POA: Diagnosis not present

## 2016-03-26 DIAGNOSIS — E119 Type 2 diabetes mellitus without complications: Secondary | ICD-10-CM | POA: Diagnosis not present

## 2016-03-26 DIAGNOSIS — M4806 Spinal stenosis, lumbar region: Secondary | ICD-10-CM | POA: Diagnosis not present

## 2016-03-26 DIAGNOSIS — Z4789 Encounter for other orthopedic aftercare: Secondary | ICD-10-CM | POA: Diagnosis not present

## 2016-03-28 DIAGNOSIS — M4806 Spinal stenosis, lumbar region: Secondary | ICD-10-CM | POA: Diagnosis not present

## 2016-03-28 DIAGNOSIS — M47816 Spondylosis without myelopathy or radiculopathy, lumbar region: Secondary | ICD-10-CM | POA: Diagnosis not present

## 2016-03-28 DIAGNOSIS — M47817 Spondylosis without myelopathy or radiculopathy, lumbosacral region: Secondary | ICD-10-CM | POA: Diagnosis not present

## 2016-03-29 DIAGNOSIS — E119 Type 2 diabetes mellitus without complications: Secondary | ICD-10-CM | POA: Diagnosis not present

## 2016-03-29 DIAGNOSIS — Z4789 Encounter for other orthopedic aftercare: Secondary | ICD-10-CM | POA: Diagnosis not present

## 2016-03-29 DIAGNOSIS — M5137 Other intervertebral disc degeneration, lumbosacral region: Secondary | ICD-10-CM | POA: Diagnosis not present

## 2016-03-29 DIAGNOSIS — M4716 Other spondylosis with myelopathy, lumbar region: Secondary | ICD-10-CM | POA: Diagnosis not present

## 2016-03-29 DIAGNOSIS — I10 Essential (primary) hypertension: Secondary | ICD-10-CM | POA: Diagnosis not present

## 2016-03-29 DIAGNOSIS — M4806 Spinal stenosis, lumbar region: Secondary | ICD-10-CM | POA: Diagnosis not present

## 2016-04-30 DIAGNOSIS — M47816 Spondylosis without myelopathy or radiculopathy, lumbar region: Secondary | ICD-10-CM | POA: Diagnosis not present

## 2016-04-30 DIAGNOSIS — M25552 Pain in left hip: Secondary | ICD-10-CM | POA: Diagnosis not present

## 2016-05-31 DIAGNOSIS — Z23 Encounter for immunization: Secondary | ICD-10-CM | POA: Diagnosis not present

## 2016-06-07 ENCOUNTER — Ambulatory Visit (INDEPENDENT_AMBULATORY_CARE_PROVIDER_SITE_OTHER): Payer: Medicare Other | Admitting: Specialist

## 2016-06-07 DIAGNOSIS — M48061 Spinal stenosis, lumbar region without neurogenic claudication: Secondary | ICD-10-CM

## 2016-06-07 DIAGNOSIS — M47817 Spondylosis without myelopathy or radiculopathy, lumbosacral region: Secondary | ICD-10-CM

## 2016-06-07 DIAGNOSIS — M25552 Pain in left hip: Secondary | ICD-10-CM | POA: Diagnosis not present

## 2016-06-15 ENCOUNTER — Ambulatory Visit: Payer: Medicare Other

## 2016-06-20 ENCOUNTER — Ambulatory Visit: Payer: Medicare HMO | Attending: Physical Therapy | Admitting: Physical Therapy

## 2016-06-20 DIAGNOSIS — R262 Difficulty in walking, not elsewhere classified: Secondary | ICD-10-CM | POA: Insufficient documentation

## 2016-06-20 DIAGNOSIS — G8929 Other chronic pain: Secondary | ICD-10-CM | POA: Insufficient documentation

## 2016-06-20 DIAGNOSIS — M5441 Lumbago with sciatica, right side: Secondary | ICD-10-CM | POA: Insufficient documentation

## 2016-06-20 DIAGNOSIS — M5442 Lumbago with sciatica, left side: Secondary | ICD-10-CM | POA: Insufficient documentation

## 2016-06-21 DIAGNOSIS — Z125 Encounter for screening for malignant neoplasm of prostate: Secondary | ICD-10-CM | POA: Diagnosis not present

## 2016-06-28 DIAGNOSIS — N401 Enlarged prostate with lower urinary tract symptoms: Secondary | ICD-10-CM | POA: Diagnosis not present

## 2016-06-28 DIAGNOSIS — R338 Other retention of urine: Secondary | ICD-10-CM | POA: Diagnosis not present

## 2016-07-03 ENCOUNTER — Ambulatory Visit: Payer: Medicare HMO | Admitting: Physical Therapy

## 2016-07-03 ENCOUNTER — Telehealth: Payer: Self-pay | Admitting: Physical Therapy

## 2016-07-03 NOTE — Telephone Encounter (Signed)
07/03/16 family member called & cxl PT eval, reports he can hardly walk, rescheduled PT eval for 07/16/16

## 2016-07-16 ENCOUNTER — Ambulatory Visit: Payer: Medicare HMO | Admitting: Physical Therapy

## 2016-07-16 DIAGNOSIS — G8929 Other chronic pain: Secondary | ICD-10-CM | POA: Diagnosis present

## 2016-07-16 DIAGNOSIS — M5441 Lumbago with sciatica, right side: Secondary | ICD-10-CM | POA: Diagnosis present

## 2016-07-16 DIAGNOSIS — R262 Difficulty in walking, not elsewhere classified: Secondary | ICD-10-CM

## 2016-07-16 DIAGNOSIS — M5442 Lumbago with sciatica, left side: Principal | ICD-10-CM

## 2016-07-16 NOTE — Therapy (Signed)
[address] [place], [place], [address] Phone: [phone]   Fax:  [phone]  Physical Therapy Evaluation  Patient Details  Name: [name] MRN: [id number] Date of Birth: [birth year] Referring Provider: Dr. Louanne Skye  Encounter Date: 07/16/2016      PT End of Session - 07/16/16 1447    Visit Number 1   Date for PT Re-Evaluation 09/10/16   PT Start Time 0200   PT Stop Time 0250   PT Time Calculation (min) 50 min   Activity Tolerance Patient tolerated treatment well;No increased pain   Behavior During Therapy WFL for tasks assessed/performed      Past Medical History:  Diagnosis Date  . Anemia   . Anxiety   . Diabetes mellitus without complication ([place])   . Diverticulosis of colon (without mention of hemorrhage)   . GERD (gastroesophageal reflux disease)   . Hypertension     Past Surgical History:  Procedure Laterality Date  . BACK SURGERY    . PARTIAL GASTRECTOMY      There were no vitals filed for this visit.       Subjective Assessment - 07/16/16 1402    Subjective Pt states he had spinal fusion surgery in July. This is the second surgery of the spine. The first surgery was in 2009 and he states he experienced increasing pain after that which required a second surgery. After the second surgery, he received home therapy for 15 days. He has continued to do his home exercises for the past 2.5 months however he says they are not helping. He says the back pain comes and goes and he says the back gets swollen. Patient states the pain is worse in the morning and as the day goes by it feels better. He has also been diagnosed with arthritis.   Patient is accompained by: Interpreter   Pertinent History Diabetes type 2 (controlled), HTN   Limitations Standing;Walking   How long can you stand comfortably? 8-10 minutes   How long can you walk comfortably? hasn't tried more than 20 minutes    Diagnostic tests Xrays, MRI   Patient Stated Goals decrease pain, walking   Currently in Pain? Yes   Pain Score 6   at worst 9/10   Pain Location Back   Pain Orientation Lower;Left;Right   Pain Descriptors / Indicators Sharp   Pain Radiating Towards both, down to the mid, posterior calf   Pain Onset Other (comment)  first surgery in 2009   Pain Frequency Intermittent   Aggravating Factors  morning time, standing   Pain Relieving Factors sitting down, walking, "ointment"   Multiple Pain Sites Yes            [facility] PT Assessment - 07/16/16 0001      Assessment   Medical Diagnosis laminectomy, spinal fusion   Referring Provider Dr. Louanne Skye   Next MD Visit 08/08/2016   Prior Therapy yes     Precautions   Precautions None     Balance Screen   Has the patient fallen in the past 6 months No     [place] residence   Living Arrangements Alone   Type of Home Apartment     Prior Function   Level of [place] Retired   Leisure walking      ROM / Strength   AROM / PROM / Strength Strength;PROM;AROM     AROM  Overall AROM  Within functional limits for tasks performed   Overall AROM Comments Only experienced pain on the way up with lumbar flexion.   AROM Assessment Site Lumbar   Lumbar Flexion WFL   Lumbar Extension <25% limited   Lumbar - Right Side Bend WFL   Lumbar - Left Side Bend WFL   Lumbar - Right Rotation WFL   Lumbar - Left Rotation [facility]     Strength   Overall Strength Within functional limits for tasks performed   Overall Strength Comments 5/5 hip and knee strength     Flexibility   Soft Tissue Assessment /Muscle Length yes   Hamstrings lacking 50 degrees bilaterally HS 90/90   ITB tight bilaterally   Piriformis tight bilaterally     Special Tests    Special Tests Lumbar   Lumbar Tests Slump Test     Slump test   Findings Positive   Comment bilaterally                    [facility] Adult PT Treatment/Exercise - 07/16/16 0001      Modalities   Modalities Moist Heat;Electrical Stimulation     Moist Heat Therapy   Number Minutes Moist Heat 15 Minutes   Moist Heat Location Lumbar Spine     Electrical Stimulation   Electrical Stimulation Location Lumbar spine   Electrical Stimulation Goals Pain                PT Education - 07/16/16 1446    Education provided Yes   Education Details HEP: gastroc, HS, piriformis stretch, TA activation   Person(s) Educated Patient   Methods Explanation;Demonstration;Handout   Comprehension Verbalized understanding;Returned demonstration;Verbal cues required          PT Short Term Goals - 07/16/16 1456      PT SHORT TERM GOAL #1   Title Pt will demonstrate proper recall of HEP   Time 1   Period Weeks   Status New           PT Long Term Goals - 07/16/16 1456      PT LONG TERM GOAL #1   Title Pt will be able to walk >30 minutes without increased symptoms.   Time 8   Period Weeks   Status New     PT LONG TERM GOAL #2   Title Pt will report 50% of worst pain rating (9/10) in order to participate in dialy activities with less pain.   Time 8   Period Weeks   Status New     PT LONG TERM GOAL #3   Title Pt will only lack 10 degrees from neutral on HS 90/90 in order to improve HS flexibility.   Time 8   Period Weeks   Status New               Plan - 07/16/16 1447    Clinical Impression Statement Patient presents with lumbar back pain post lumbar surgery. Pt has tight HS, gastroc, pirifromis and ITB bilaterally however the right side he reports is worse. Pt strength is [facility] however core is weak. Pt reports he never does core exercises so education was given on TA activation. Pt is very active and voices a desire to get new home exercsies. Pt given LE stretches and TA activation to do.     Rehab Potential Good   PT Frequency 2x / week   PT Duration 8 weeks   PT Treatment/Interventions  ADLs/Self Care Home  Management;Cryotherapy;Electrical Stimulation;Iontophoresis 4mg /ml Dexamethasone;Moist Heat;Ultrasound;Gait training;Stair training;Functional mobility training;Patient/family education;Neuromuscular re-education;Balance training;Therapeutic exercise;Therapeutic activities;Manual techniques;Passive range of motion   PT Next Visit Plan LE stretching (passive and active), core stability, pain management   PT Home Exercise Plan LE stretching, TA activation   Consulted and Agree with Plan of Care Patient      Patient will benefit from skilled therapeutic intervention in order to improve the following deficits and impairments:  Decreased activity tolerance, Decreased mobility, Decreased endurance, Abnormal gait, Postural dysfunction, Improper body mechanics, Pain, Impaired flexibility, Hypomobility, Dizziness, Difficulty walking  Visit Diagnosis: Chronic bilateral low back pain with bilateral sciatica  Difficulty in walking, not elsewhere classified     Problem List Patient Active Problem List   Diagnosis Date Noted  . Postoperative anemia due to acute blood loss 02/29/2016    Class: Acute  . 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 Degenerative disc disease, lumbar 02/28/2016    Class: Chronic  . Lumbar spondylosis with myelopathy 02/28/2016    Class: Chronic  . Lumbar spondylosis 02/28/2016  . Abdominal pain 06/08/2013  . ANEMIA-IRON DEFICIENCY 04/18/2010  . ESSENTIAL HYPERTENSION 04/18/2010  . EMPHYSEMA 04/18/2010  . GERD 04/18/2010  . 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 ABDOMINAL PAIN OTHER SPECIFIED SITE 04/18/2010    Toy Baker, SPT 07/16/2016, 3:02 PM  [address] [place], [place], [address] Phone: [phone]   Fax:  [phone]  Name: [name] MRN: [id number] Date of Birth: 27-Jul-1946

## 2016-07-19 ENCOUNTER — Ambulatory Visit: Payer: Medicare HMO | Admitting: Physical Therapy

## 2016-07-23 ENCOUNTER — Ambulatory Visit: Payer: Medicare Other | Admitting: Physical Therapy

## 2016-07-26 ENCOUNTER — Encounter: Payer: Self-pay | Admitting: Physical Therapy

## 2016-07-26 ENCOUNTER — Ambulatory Visit: Payer: Medicare Other | Attending: Specialist | Admitting: Physical Therapy

## 2016-07-26 DIAGNOSIS — M5441 Lumbago with sciatica, right side: Secondary | ICD-10-CM | POA: Insufficient documentation

## 2016-07-26 DIAGNOSIS — M5442 Lumbago with sciatica, left side: Secondary | ICD-10-CM | POA: Insufficient documentation

## 2016-07-26 DIAGNOSIS — G8929 Other chronic pain: Secondary | ICD-10-CM | POA: Diagnosis present

## 2016-07-26 DIAGNOSIS — R262 Difficulty in walking, not elsewhere classified: Secondary | ICD-10-CM | POA: Diagnosis present

## 2016-07-26 NOTE — Therapy (Addendum)
[place] [place], [place], [address] Phone: [phone]   Fax:  [phone]  Physical Therapy Treatment  Patient Details  Name: [name] MRN: [id number] Date of Birth: October 04, 1945 Referring Provider: Dr. Louanne Skye  Encounter Date: 07/26/2016      PT End of Session - 07/26/16 1138    Visit Number 2   Date for PT Re-Evaluation 09/10/16   PT Start Time 1100   PT Stop Time 1140   PT Time Calculation (min) 40 min   Activity Tolerance Patient tolerated treatment well;No increased pain   Behavior During Therapy WFL for tasks assessed/performed      Past Medical History:  Diagnosis Date  . Anemia   . Anxiety   . Diabetes mellitus without complication ([place])   . Diverticulosis of colon (without mention of hemorrhage)   . GERD (gastroesophageal reflux disease)   . Hypertension     Past Surgical History:  Procedure Laterality Date  . BACK SURGERY    . PARTIAL GASTRECTOMY      There were no vitals filed for this visit.      Subjective Assessment - 07/26/16 1101    Subjective "I have a little bit of pain"   Patient is accompained by: Interpreter   Pain Score 4    Pain Location Back   Pain Orientation Right;Lower                         [facility] Adult PT Treatment/Exercise - 07/26/16 0001      Exercises   Exercises Lumbar     Lumbar Exercises: Aerobic   Stationary Bike NuStep L5 x 6 min     Lumbar Exercises: Machines for Strengthening   Cybex Knee Extension 10lb 2x10   Cybex Knee Flexion 25lb 2x10   Other Lumbar Machine Exercise Rows & Lats 20lb 2x10     Lumbar Exercises: Standing   Shoulder Extension Both;Theraband;10 reps  x2   Theraband Level (Shoulder Extension) Level 3 (Green)   Other Standing Lumbar Exercises Overhead lumbar Ext 2x10     Lumbar Exercises: Seated   Sit to Stand 10 reps  x2, with OHP with yellow ball    Other Seated Lumbar Exercises Seat  trunk rotations with blue ball. x10                  PT Short Term Goals - 07/26/16 1129      PT SHORT TERM GOAL #1   Title Pt will demonstrate proper recall of HEP   Status Achieved           PT Long Term Goals - 07/26/16 1126      PT LONG TERM GOAL #1   Title Pt will be able to walk >30 minutes without increased symptoms.   Status Partially Met     PT LONG TERM GOAL #2   Status Partially Met     PT LONG TERM GOAL #3   Title Pt will only lack 10 degrees from neutral on HS 90/90 in order to improve HS flexibility.   Status On-going               Plan - 07/26/16 1138    Clinical Impression Statement Pt continues to present with tight muscles of LE. He was able to compete all of today's exercises. With today's exercises he does have some pain with standing overhead extensions.    Rehab Potential Good  PT Frequency 2x / week   PT Duration 8 weeks   PT Treatment/Interventions ADLs/Self Care Home Management;Cryotherapy;Electrical Stimulation;Iontophoresis 7m/ml Dexamethasone;Moist Heat;Ultrasound;Gait training;Stair training;Functional mobility training;Patient/family education;Neuromuscular re-education;Balance training;Therapeutic exercise;Therapeutic activities;Manual techniques;Passive range of motion   PT Next Visit Plan LE stretching (passive and active), core stability, pain management      Patient will benefit from skilled therapeutic intervention in order to improve the following deficits and impairments:  Decreased activity tolerance, Decreased mobility, Decreased endurance, Abnormal gait, Postural dysfunction, Improper body mechanics, Pain, Impaired flexibility, Hypomobility, Dizziness, Difficulty walking  Visit Diagnosis: Chronic bilateral low back pain with bilateral sciatica  Difficulty in walking, not elsewhere classified     Problem List Patient Active Problem List   Diagnosis Date Noted  . Postoperative anemia due to acute blood loss  02/29/2016    Class: Acute  . 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 Degenerative disc disease, lumbar 02/28/2016    Class: Chronic  . Lumbar spondylosis with myelopathy 02/28/2016    Class: Chronic  . Lumbar spondylosis 02/28/2016  . Abdominal pain 06/08/2013  . ANEMIA-IRON DEFICIENCY 04/18/2010  . ESSENTIAL HYPERTENSION 04/18/2010  . EMPHYSEMA 04/18/2010  . GERD 04/18/2010  . 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 ABDOMINAL PAIN OTHER SPECIFIED SITE 04/18/2010    PHYSICAL THERAPY DISCHARGE SUMMARY  Visits from Start of Care: 2   Plan: Patient agrees to discharge.  Patient goals were partially met. Patient is being discharged due to being pleased with the current functional level.  ?????     R[place] Jun PTA 07/26/2016, 11:42 AM  [address] [address] [address]Phone: [phone]  Fax:  [phone] Name: Jason PostleMRN: [id number] of Birth: 103-16-1947

## 2016-07-31 ENCOUNTER — Ambulatory Visit: Payer: Medicare Other | Admitting: Physical Therapy

## 2016-08-02 ENCOUNTER — Ambulatory Visit: Payer: Medicare Other | Admitting: Physical Therapy

## 2016-08-08 ENCOUNTER — Ambulatory Visit (INDEPENDENT_AMBULATORY_CARE_PROVIDER_SITE_OTHER): Payer: Medicare Other | Admitting: Specialist

## 2016-08-16 DIAGNOSIS — K219 Gastro-esophageal reflux disease without esophagitis: Secondary | ICD-10-CM | POA: Diagnosis not present

## 2016-08-16 DIAGNOSIS — N400 Enlarged prostate without lower urinary tract symptoms: Secondary | ICD-10-CM | POA: Diagnosis not present

## 2016-08-16 DIAGNOSIS — Z1159 Encounter for screening for other viral diseases: Secondary | ICD-10-CM | POA: Diagnosis not present

## 2016-08-16 DIAGNOSIS — E119 Type 2 diabetes mellitus without complications: Secondary | ICD-10-CM | POA: Insufficient documentation

## 2016-08-16 DIAGNOSIS — S0300XA Dislocation of jaw, unspecified side, initial encounter: Secondary | ICD-10-CM | POA: Diagnosis not present

## 2016-08-16 DIAGNOSIS — R079 Chest pain, unspecified: Secondary | ICD-10-CM | POA: Diagnosis not present

## 2016-08-16 DIAGNOSIS — R0609 Other forms of dyspnea: Secondary | ICD-10-CM | POA: Diagnosis not present

## 2016-08-16 DIAGNOSIS — I10 Essential (primary) hypertension: Secondary | ICD-10-CM | POA: Diagnosis not present

## 2016-09-04 DIAGNOSIS — I2089 Other forms of angina pectoris: Secondary | ICD-10-CM | POA: Insufficient documentation

## 2016-09-04 DIAGNOSIS — I209 Angina pectoris, unspecified: Secondary | ICD-10-CM | POA: Insufficient documentation

## 2016-10-17 ENCOUNTER — Telehealth: Payer: Self-pay | Admitting: General Practice

## 2016-10-17 NOTE — Telephone Encounter (Signed)
APT. REMINDER CALL, NO ANSWER, NO VOICEMAIL °

## 2016-10-18 ENCOUNTER — Ambulatory Visit (INDEPENDENT_AMBULATORY_CARE_PROVIDER_SITE_OTHER): Payer: Medicare Other | Admitting: Internal Medicine

## 2016-10-18 ENCOUNTER — Encounter: Payer: Self-pay | Admitting: Internal Medicine

## 2016-10-18 VITALS — BP 132/69 | HR 71 | Temp 98.1°F | Ht 65.0 in | Wt 161.3 lb

## 2016-10-18 DIAGNOSIS — Z981 Arthrodesis status: Secondary | ICD-10-CM

## 2016-10-18 DIAGNOSIS — I25119 Atherosclerotic heart disease of native coronary artery with unspecified angina pectoris: Secondary | ICD-10-CM | POA: Diagnosis not present

## 2016-10-18 DIAGNOSIS — Z841 Family history of disorders of kidney and ureter: Secondary | ICD-10-CM

## 2016-10-18 DIAGNOSIS — Z7982 Long term (current) use of aspirin: Secondary | ICD-10-CM | POA: Diagnosis not present

## 2016-10-18 DIAGNOSIS — Z903 Acquired absence of stomach [part of]: Secondary | ICD-10-CM

## 2016-10-18 DIAGNOSIS — Z7984 Long term (current) use of oral hypoglycemic drugs: Secondary | ICD-10-CM | POA: Diagnosis not present

## 2016-10-18 DIAGNOSIS — K912 Postsurgical malabsorption, not elsewhere classified: Secondary | ICD-10-CM

## 2016-10-18 DIAGNOSIS — Z811 Family history of alcohol abuse and dependence: Secondary | ICD-10-CM

## 2016-10-18 DIAGNOSIS — I10 Essential (primary) hypertension: Secondary | ICD-10-CM

## 2016-10-18 DIAGNOSIS — Z800 Family history of malignant neoplasm of digestive organs: Secondary | ICD-10-CM

## 2016-10-18 DIAGNOSIS — Z8249 Family history of ischemic heart disease and other diseases of the circulatory system: Secondary | ICD-10-CM

## 2016-10-18 DIAGNOSIS — N400 Enlarged prostate without lower urinary tract symptoms: Secondary | ICD-10-CM

## 2016-10-18 DIAGNOSIS — Z0189 Encounter for other specified special examinations: Secondary | ICD-10-CM | POA: Diagnosis not present

## 2016-10-18 DIAGNOSIS — E871 Hypo-osmolality and hyponatremia: Secondary | ICD-10-CM | POA: Diagnosis not present

## 2016-10-18 DIAGNOSIS — Z833 Family history of diabetes mellitus: Secondary | ICD-10-CM

## 2016-10-18 DIAGNOSIS — Z79899 Other long term (current) drug therapy: Secondary | ICD-10-CM | POA: Diagnosis not present

## 2016-10-18 DIAGNOSIS — K219 Gastro-esophageal reflux disease without esophagitis: Secondary | ICD-10-CM | POA: Diagnosis not present

## 2016-10-18 DIAGNOSIS — E119 Type 2 diabetes mellitus without complications: Secondary | ICD-10-CM

## 2016-10-18 DIAGNOSIS — I209 Angina pectoris, unspecified: Secondary | ICD-10-CM | POA: Diagnosis not present

## 2016-10-18 DIAGNOSIS — Z8049 Family history of malignant neoplasm of other genital organs: Secondary | ICD-10-CM

## 2016-10-18 DIAGNOSIS — D509 Iron deficiency anemia, unspecified: Secondary | ICD-10-CM | POA: Diagnosis not present

## 2016-10-18 DIAGNOSIS — D539 Nutritional anemia, unspecified: Secondary | ICD-10-CM | POA: Diagnosis not present

## 2016-10-18 DIAGNOSIS — Z87891 Personal history of nicotine dependence: Secondary | ICD-10-CM

## 2016-10-18 MED ORDER — FERROUS SULFATE 325 (65 FE) MG PO TBEC: 325.0000 mg | DELAYED_RELEASE_TABLET | Freq: Two times a day (BID) | ORAL | 3 refills | Status: DC

## 2016-10-18 MED ORDER — PREGABALIN 25 MG PO CAPS: 25.0000 mg | ORAL_CAPSULE | Freq: Three times a day (TID) | ORAL | 2 refills | Status: DC

## 2016-10-18 MED ORDER — NITROGLYCERIN 0.4 MG SL SUBL: 0.4000 mg | SUBLINGUAL_TABLET | Freq: Every day | SUBLINGUAL | 2 refills | Status: DC

## 2016-10-18 NOTE — Assessment & Plan Note (Signed)
Patient history of GERD, currently on PPI therapy with omeprazole 20 mg daily. His PPI therapy is also in the setting of his partial gastrectomy for ruptured peptic ulcer. We plan to continue this therapy as he is currently a symptomatically from GERD and has not had any signs or symptoms of recurrent GI bleeding.  Assessment: Well controlled GERD  Plan: Continue PPI therapy.

## 2016-10-18 NOTE — Progress Notes (Signed)
CC:  Patient establishing care, diabetes, angina, hypertension, lumbar radiculopathy, iron deficiency anemia HPI: [name] is a 71 y.o. male with a h/o of type 2 diabetes mellitus, hypertension, coronary artery disease with angina, cardiac deficiency anemia status post partial gastrectomy, lumbar radiculopathy status post laminectomy and spinal fusion who presents to establish care for his chronic medical additions above  Please see Problem-based charting for HPI and the status of patient's chronic medical conditions.  Past Medical History:  Diagnosis Date  . Anemia   . Anxiety   . Diabetes mellitus without complication ([place])   . Diverticulosis of colon (without mention of hemorrhage)   . GERD (gastroesophageal reflux disease)   . Hypertension    Social History  Substance Use Topics  . Smoking status: Former Research scientist (life sciences)  . Smokeless tobacco: Never Used  . Alcohol use No   Family History  Problem Relation Age of Onset  . Uterine cancer Mother   . Liver cancer Father   . Colon cancer Neg Hx   . Esophageal cancer Neg Hx   . Diabetes Mother   . Heart disease Mother   . Kidney disease Neg Hx    Review of Systems: ROS in HPI. Otherwise: Review of Systems  Constitutional: Negative for chills, fever and weight loss.  Respiratory: Positive for shortness of breath. Negative for cough.   Cardiovascular: Positive for chest pain. Negative for leg swelling.  Gastrointestinal: Negative for abdominal pain, constipation, diarrhea, nausea and vomiting.  Genitourinary: Negative for dysuria, frequency and urgency.  Musculoskeletal: Positive for back pain. Negative for myalgias.  Neurological: Positive for tingling and headaches.   Physical Exam: Vitals:   10/18/16 1443  BP: 132/69  Pulse: 71  Temp: 98.1 F (36.7 C)  TempSrc: Oral  SpO2: 99%  Weight: 161 lb 4.8 oz (73.2 kg)  Height: 5\' 5"  (1.651 m)   Physical Exam  Constitutional: He appears well-nourished. He is  cooperative. No distress.  Cardiovascular: Normal rate, regular rhythm, normal heart sounds, intact distal pulses and normal pulses.  Exam reveals no gallop.   No murmur heard. Pulmonary/Chest: Effort normal and breath sounds normal. No respiratory distress. He has no wheezes. He has no rhonchi. He has no rales.  Abdominal: Soft. Bowel sounds are normal. There is no tenderness.  Musculoskeletal: He exhibits no edema.  Bilateral pain to straight leg raise  Skin: Skin is intact. He is not diaphoretic.    Assessment & Plan:  See encounters tab for problem based medical decision making. Patient discussed with [name]  Type 2 diabetes mellitus without complication, without long-term current use of insulin ([place]) Patient has long-standing history of type 2 diabetes mellitus, currently controlled on metformin 500 mg twice a day. He takes his medication regularly. Patient has last A1c of 6.1 which was done earlier this month at [facility]. Patient has no symptoms of hypoglycemia.  Assessment: Well controlled type 2 diabetes  Plan: Continue metformin 500 mg twice a day Follow up in 3 months, plan for annual physical exam and referral to ophthalmology for diabetic retinopathy screening for fundus photography at that visit.  Iron deficiency anemia Patient has a history of iron deficiency anemia with borderline low blood counts. Most recently his blood counts were 12.3 earlier this month at [facility] Patient has a history of partial gastrectomy after rupture of peptic ulcer in the 1990s, he has a Billroth II anastomosis which puts him at high risk for malabsorptive anemia with iron and B12 deficiency. Patient is  not currently on any iron supplementation or vitamin supplementation. Patient had iron studies which are completed her [facility] which confirm his iron deficiency. Patient also has coronary artery disease. At higher risk for symptomatic presentation with depressed oxygen-carrying  capacity.  On most recent [place], RDW is elevated, MCV is within normal limits but may have mixed picture of microcytic and macrocytic anemias.  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  Gastroesophageal reflux disease without esophagitis Patient history of GERD, currently on PPI therapy with omeprazole 20 mg daily. His PPI therapy is also in the setting of his partial gastrectomy for ruptured peptic ulcer. We plan to continue this therapy as he is currently a symptomatically from GERD and has not had any signs or symptoms of recurrent GI bleeding.  Assessment: Well controlled GERD  Plan: Continue PPI therapy.  Essential hypertension Patient has a long-standing history of hypertension. He is currently on lisinopril-HIDA chlorothiazide 20-12 0.5 mg daily, in addition to metoprolol succinate 50 mg daily. His blood pressure is well-controlled today at 132/69. Blood pressure control is important in this gentleman with developing coronary artery disease. He is currently at target and will continue his current medications.  Assessment: Well-controlled hypertension  Plan: Continue current therapy.  Benign prostatic hyperplasia without lower urinary tract symptoms Patient is a history of BPH currently on tamsulosin 0.4 mg daily therapy. Patient has no current symptoms of hesitancy or dribbling with urination.  Assessment: Well controlled BPH  Plan: Continue tamsulosin  Angina pectoris [facility]) Patient complains of stable anginal symptoms. He is followed by cardiology at [facility] for this. He reports that he gets exertional chest pain with walking roughly half a block. He also has some dyspnea on exertion with the same distance. He rarely gets any chest pain at rest. He recently had workup at [facility] with left heart cath and echocardiogram. Left heart cath showed no obstructive disease with moderate stenosis of LAD and RCA. Echocardiogram demonstrated moderately preserved LVEF at 50-55%, some LVH, and mild diastolic dysfunction. Patient is currently on metoprolol succinate 50 mg daily. He has nitroglycerin subungual tablets as needed for anginal pain. He is currently taking statin therapy with 80 mg atorvastatin daily. And he is taking 81 mg aspirin daily. In addition to these therapies his blood pressure appears to be well optimized and his diabetes appears to be in good control. Most recent recognition was to continue optimal medical therapy. Recommend that he continue to follow-up with cardiology at [facility] since he established care there. Patient has expressed some difficulty with obtaining information from his prior cardiologist and was unaware of his most recent catheterization results which I relayed to the patient. We will attempt to schedule a follow-up appointment for him today and make certain that he attends his appointment with some assistance navigating the medical system in light of his language barrier.  Assessment: Stable angina, nonobstructing coronary artery disease  Plan: Continue medical therapy as described above, follow-up with cardiology at [place]: Holley Raring, MD 10/18/2016, [place] PM  Pager: [phone]

## 2016-10-18 NOTE — Assessment & Plan Note (Addendum)
Patient has a history of iron deficiency anemia with borderline low blood counts. Most recently his blood counts were 12.3 earlier this month at [facility]. Patient has a history of partial gastrectomy after rupture of peptic ulcer in the 1990s, he has a Billroth II anastomosis which puts him at high risk for malabsorptive anemia with iron and B12 deficiency. Patient is not currently on any iron supplementation or vitamin supplementation. Patient had iron studies which are completed her [facility] which confirm his iron deficiency. Patient also has coronary artery disease. At higher risk for symptomatic presentation with depressed oxygen-carrying capacity.  On most recent [place], RDW is elevated, MCV is within normal limits but may have mixed picture of microcytic and macrocytic anemias.  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

## 2016-10-18 NOTE — Assessment & Plan Note (Signed)
Patient has long-standing history of type 2 diabetes mellitus, currently controlled on metformin 500 mg twice a day. He takes his medication regularly. Patient has last A1c of 6.1 which was done earlier this month at [facility]. Patient has no symptoms of hypoglycemia.  Assessment: Well controlled type 2 diabetes  Plan: Continue metformin 500 mg twice a day Follow up in 3 months, plan for annual physical exam and referral to ophthalmology for diabetic retinopathy screening for fundus photography at that visit.

## 2016-10-18 NOTE — Assessment & Plan Note (Signed)
Patient is a history of BPH currently on tamsulosin 0.4 mg daily therapy. Patient has no current symptoms of hesitancy or dribbling with urination.  Assessment: Well controlled BPH  Plan: Continue tamsulosin

## 2016-10-18 NOTE — Assessment & Plan Note (Addendum)
Patient complains of stable anginal symptoms. He is followed by cardiology at [facility] for this. He reports that he gets exertional chest pain with walking roughly half a block. He also has some dyspnea on exertion with the same distance. He rarely gets any chest pain at rest. He recently had workup at [facility] with left heart cath and echocardiogram. Left heart cath showed no obstructive disease with moderate stenosis of LAD and RCA. Echocardiogram demonstrated moderately preserved LVEF at 50-55%, some LVH, and mild diastolic dysfunction. Patient is currently on metoprolol succinate 50 mg daily. He has nitroglycerin subungual tablets as needed for anginal pain. He is currently taking statin therapy with 80 mg atorvastatin daily. And he is taking 81 mg aspirin daily. In addition to these therapies his blood pressure appears to be well optimized and his diabetes appears to be in good control. Most recent recognition was to continue optimal medical therapy. Recommend that he continue to follow-up with cardiology at [facility] since he established care there. Patient has expressed some difficulty with obtaining information from his prior cardiologist and was unaware of his most recent catheterization results which I relayed to the patient. We will attempt to schedule a follow-up appointment for him today and make certain that he attends his appointment with some assistance navigating the medical system in light of his language barrier.  Assessment: Stable angina, nonobstructing coronary artery disease  Plan: Continue medical therapy as described above, follow-up with cardiology at [facility]

## 2016-10-18 NOTE — Assessment & Plan Note (Signed)
Patient has a long-standing history of hypertension. He is currently on lisinopril-HIDA chlorothiazide 20-12 0.5 mg daily, in addition to metoprolol succinate 50 mg daily. His blood pressure is well-controlled today at 132/69. Blood pressure control is important in this gentleman with developing coronary artery disease. He is currently at target and will continue his current medications.  Assessment: Well-controlled hypertension  Plan: Continue current therapy.

## 2016-10-18 NOTE — Patient Instructions (Addendum)
Le enviaremos varios anlisis de sangre para evaluar su conteo sanguneo, su enfermedad cardaca y su diabetes. Tambin enviar una recarga para su nitroglicerina que tomar segn sea necesario para su dolor en el pecho. He enviado una nueva receta para un medicamento llamado Lyrica que debes tomar en lugar de la gabapentina para el dolor que tienes en las piernas. Este medicamento puede funcionar mejor que la gabapentina, pero si contina teniendo efectos secundarios significativos debido a que el dolor no se controla, asegrese de Solicitor de regreso a nuestra clnica para una evaluacin adicional.  Ser importante que contine haciendo un seguimiento de la cardiologa para su enfermedad cardaca, trabajamos con usted para programar una cita de seguimiento.  Plan de alimentacin DASH (DASH Eating Plan) DASH es la sigla en ingls de "Enfoques Alimentarios para Detener la Hipertensin". El plan de alimentacin DASH ha demostrado bajar la presin arterial elevada (hipertensin). Los beneficios adicionales para la salud pueden incluir la disminucin del riesgo de diabetes mellitus tipo2, enfermedades cardacas e ictus. Este plan tambin puede ayudar a Horticulturist, commercial. QU DEBO SABER ACERCA DEL PLAN DE ALIMENTACIN DASH? Para el plan de alimentacin DASH, seguir las siguientes pautas generales:  Elija los alimentos que contienen menos de 150 miligramos de sodio por porcin (segn se indica en la etiqueta de los alimentos).  Use hierbas o aderezos sin sal, en lugar de sal de mesa o sal marina.  Consulte al mdico o farmacutico antes de usar sustitutos de la sal.  Consuma los productos con menor contenido de sodio. Estos productos suelen estar etiquetados como "bajo en sodio" o "sin agregado de sal".  Coma alimentos frescos. No consuma una gran cantidad de alimentos enlatados.  Coma ms verduras, frutas y productos lcteos con bajo contenido de [place].  Elija los cereales integrales. Busque la palabra  "integral" en Equities trader de la lista de ingredientes.  Elija el pescado y el pollo o el pavo sin piel ms a menudo que las carnes rojas. Limite el consumo de pescado, carne de ave y carne a 6onzas (170g) por Training and development officer.  Limite el consumo de dulces, postres, azcares y bebidas azucaradas.  Elija las grasas saludables para el corazn.  Consuma ms comida casera y menos de restaurante, de buf y comida rpida.  Limite el consumo de alimentos fritos.  No fra los alimentos. A la hora de cocinarlos, opte por hornearlos, hervirlos, grillarlos y asarlos a Administrator, arts.  Cuando coma en un restaurante, pida que preparen su comida con menos sal o, en lo posible, sin nada de sal. QU ALIMENTOS PUEDO COMER? Pida ayuda a un nutricionista para conocer las necesidades calricas individuales. Cereales Pan de salvado o integral. Arroz integral. Pastas de salvado o integrales. Quinua, trigo burgol y cereales integrales. Cereales con bajo contenido de sodio. Tortillas de harina de maz o de salvado. Pan de maz integral. Galletas saladas integrales. Galletas con bajo contenido de [place]. Vegetales Verduras frescas o congeladas (crudas, al vapor, asadas o grilladas). Jugos de tomate y verduras con contenido bajo o reducido de sodio. Pasta y salsa de tomate con contenido bajo o [place]. Verduras enlatadas con bajo contenido de sodio o reducido de sodio. Lambert Mody Lambert Mody frescas, en conserva (en su jugo natural) o frutas congeladas. Carnes y otros productos con protenas Carne de res molida (al 85% o ms [place]), carne de res de animales alimentados con pastos o carne de res sin la grasa. Pollo o pavo sin piel. Carne de pollo o de [place]. Cerdo sin la  grasaSan Jetty los pescados y frutos de mar. Huevos. Porotos, guisantes o lentejas secos. Frutos secos y semillas sin sal. Frijoles enlatados sin sal. Lcteos Productos lcteos con bajo contenido de grasas, como [place] o al 1%, quesos reducidos  en grasas o al 2%, ricota con bajo contenido de grasas o [employer], o yogur natural con bajo contenido de [place]. Quesos con contenido bajo o reducido de sodio. Grasas y Naval architect en barra que no contengan grasas trans. Mayonesa y alios para ensaladas livianos o reducidos en grasas (reducidos en sodio). [place]. Aceites de crtamo, oliva o canola. [place] natural de man o almendra. Otros Palomitas de maz y pretzels sin sal. Los artculos mencionados arriba pueden no ser [employer] de las bebidas o los alimentos recomendados. Comunquese con el nutricionista para conocer ms opciones. QU ALIMENTOS NO SE RECOMIENDAN? Cereales Pan blanco. Pastas blancas. Arroz blanco. Pan de maz refinado. Bagels y croissants. Galletas saladas que contengan grasas trans. Vegetales Vegetales con crema o fritos. Verduras en [place]. Verduras enlatadas comunes. Pasta y salsa de tomate en lata comunes. Jugos comunes de tomate y de verduras. Lambert Mody Fruta enlatada en almbar liviano o espeso. Jugo de frutas. Carnes y otros productos con protenas [place] de carne con Lobbyist. Costillas, alas de pollo, tocineta, salchicha, mortadela, salame, chinchulines, tocino, perros calientes, salchichas alemanas y embutidos envasados. Frutos secos y semillas con sal. Frijoles con sal en lata. Lcteos Leche entera o al 2%, crema, mezcla de [place] y crema, y queso crema. Yogur entero o endulzado. Quesos o queso azul con alto contenido de Physicist, medical. Cremas no lcteas y coberturas batidas. Quesos procesados, quesos para untar o cuajadas. Condimentos Sal de cebolla y ajo, sal condimentada, sal de mesa y sal marina. Salsas en lata y envasadas. Salsa [place]shire. Salsa trtara. Salsa barbacoa. Salsa teriyaki. Salsa de soja, incluso la que tiene contenido reducido de [place]. Salsa de carne. Salsa de pescado. Salsa de [place]. Salsa rosada. Rbano picante. Ketchup y mostaza. Saborizantes y tiernizantes para  carne. Caldo en cubitos. Salsa picante. Salsa tabasco. Adobos. Aderezos para tacos. Salsas. Grasas y aceites [place], [place] en barra, [place] de [place], [place], [place] clarificada y Wendee Copp de tocino. Aceites de coco, de palmiste o de palma. Aderezos comunes para ensalada. Otros Pickles y [place]. Palomitas de maz y pretzels con sal. Los artculos mencionados arriba pueden no ser [employer] Company de las bebidas y los alimentos que se Higher education careers adviser. Comunquese con el nutricionista para obtener ms informacin. DNDE Dolan Amen MS INFORMACIN? [place], del Pulmn y de Herbalist (National Heart, Lung, and [place]): travelstabloid.com Esta informacin no tiene Marine scientist el consejo del mdico. Asegrese de hacerle al mdico cualquier pregunta que tenga. Document Released: 07/26/2011 Document Revised: 11/28/2015 Document Reviewed: 06/10/2013 Elsevier Interactive Patient Education  2017 [place] another appointment.

## 2016-10-19 ENCOUNTER — Other Ambulatory Visit: Payer: Self-pay | Admitting: Internal Medicine

## 2016-10-19 ENCOUNTER — Telehealth: Payer: Self-pay | Admitting: *Deleted

## 2016-10-19 DIAGNOSIS — E871 Hypo-osmolality and hyponatremia: Secondary | ICD-10-CM

## 2016-10-19 DIAGNOSIS — E222 Syndrome of inappropriate secretion of antidiuretic hormone: Secondary | ICD-10-CM | POA: Insufficient documentation

## 2016-10-19 LAB — BMP8+ANION GAP
ANION GAP: 17 mmol/L (ref 10.0–18.0)
BUN/Creatinine Ratio: 19 (ref 10–24)
BUN: 16 mg/dL (ref 8–27)
CALCIUM: 9 mg/dL (ref 8.6–10.2)
CO2: 22 mmol/L (ref 18–29)
CREATININE: 0.83 mg/dL (ref 0.76–1.27)
Chloride: 84 mmol/L — ABNORMAL LOW (ref 96–106)
GFR calc Af Amer: 102 mL/min/{1.73_m2} (ref >59)
GFR, EST NON AFRICAN AMERICAN: 88 mL/min/{1.73_m2} (ref >59)
Glucose: 106 mg/dL — ABNORMAL HIGH (ref 65–99)
Potassium: 4.8 mmol/L (ref 3.5–5.2)
Sodium: 123 mmol/L — ABNORMAL LOW (ref 134–144)

## 2016-10-19 LAB — VITAMIN B12: Vitamin B-12: 399 pg/mL (ref 232–1245)

## 2016-10-19 LAB — LIPID PANEL
CHOL/HDL RATIO: 3.4 ratio (ref 0.0–5.0)
CHOLESTEROL TOTAL: 164 mg/dL (ref 100–199)
HDL: 48 mg/dL (ref >39)
LDL Calculated: 100 mg/dL — ABNORMAL HIGH (ref 0–99)
TRIGLYCERIDES: 78 mg/dL (ref 0–149)
VLDL Cholesterol Cal: 16 mg/dL (ref 5–40)

## 2016-10-19 MED ORDER — LISINOPRIL 20 MG PO TABS: 20.0000 mg | ORAL_TABLET | Freq: Every day | ORAL | 0 refills | Status: DC

## 2016-10-19 NOTE — Telephone Encounter (Signed)
Nitrostat rx qty written "1" - pharmacy needs new rx  sent with 1 bottle (vial) of 25 or 4 vials (100 ct). Please verify Thanks

## 2016-10-19 NOTE — Assessment & Plan Note (Addendum)
Records from [facility] demonstrate progressive hyponatremia to Na of 125 on 2/6 which trended back up on 2/12 to 129. He had a normal baseline in January of 2018. He denies any increase in urination, changes to nutrition or PO intake.  Assessment: Asymptomatic hyponatremia  Plan: Recheck BMP May plan for urine studies if Na still low  ADDENDUM: Na 123 on BMP.

## 2016-10-19 NOTE — Progress Notes (Signed)
Internal Medicine Clinic Attending  Case discussed with Dr. Strelow at the time of the visit.  We reviewed the resident's history and exam and pertinent patient test results.  I agree with the assessment, diagnosis, and plan of care documented in the resident's note.  

## 2016-10-19 NOTE — Telephone Encounter (Signed)
1 bottle (vial). Do I need to resend a new Rx? Thanks

## 2016-10-19 NOTE — Progress Notes (Unsigned)
Pt was notified of Na of 123 and requested that pt return to clinic for further evaluation. Confirmed that pt is currently asymptomatic. Denies EtOH, nutrition is reportedly unchanged and well balanced.  Ordered Urine Osm, Urine Na, Serum Osm, repeat BMP. Discontinued HCTZ in combination lisinopril-HCTZ and sent new Rx for single pill lisinopril to [facility].

## 2016-10-22 ENCOUNTER — Other Ambulatory Visit: Payer: Self-pay

## 2016-10-22 ENCOUNTER — Ambulatory Visit (INDEPENDENT_AMBULATORY_CARE_PROVIDER_SITE_OTHER): Payer: Medicare Other | Admitting: Internal Medicine

## 2016-10-22 VITALS — BP 156/81 | HR 79 | Temp 98.0°F | Ht 65.0 in | Wt 161.0 lb

## 2016-10-22 DIAGNOSIS — E871 Hypo-osmolality and hyponatremia: Secondary | ICD-10-CM | POA: Diagnosis not present

## 2016-10-22 NOTE — Telephone Encounter (Signed)
[facility] pharmacy informed 1 bottle(vial).

## 2016-10-22 NOTE — Progress Notes (Signed)
   CC: Hyponatremia  HPI:  [name] is a 71 y.o. with past medical history as outlined below who presents to clinic for hyponatremia. See problem list for further details.  Past Medical History:  Diagnosis Date  . Anemia   . Anxiety   . Diabetes mellitus without complication ([place])   . Diverticulosis of colon (without mention of hemorrhage)   . GERD (gastroesophageal reflux disease)   . Hypertension     Review of Systems:  Denies leg swelling, weight gain or loss, confusion, n/v, current dizziness. He does have occasional dizziness while standing. Appetite is good.   Physical Exam:  Vitals:   10/22/16 1547  BP: (!) 156/81  Pulse: 79  Temp: 98 F (36.7 C)  TempSrc: Oral  SpO2: 98%  Weight: 161 lb (73 kg)  Height: 5\' 5"  (1.651 m)   Physical Exam  Constitutional:  appears well-developed and well-nourished. No distress.  HENT:  Head: Normocephalic and atraumatic.  Nose: Nose normal.  Cardiovascular: Normal rate, regular rhythm and normal heart sounds.  Exam reveals no gallop and no friction rub.   No murmur heard. Pulmonary/Chest: Effort normal and breath sounds normal. No respiratory distress.  has no wheezes.no rales.  Abdominal: Soft. Bowel sounds are normal.  exhibits no distension. There is no tenderness. There is no rebound and no guarding.  Skin: Skin is warm and dry. No rash noted.  not diaphoretic. No erythema. No pallor.  Ext: trace pedal edema   Assessment & Plan:   See Encounters Tab for problem based charting.  Patient discussed with Dr. Dareen Piano

## 2016-10-22 NOTE — Progress Notes (Signed)
Had in-house spanish speaking interpreter,[name], come to the clinic and call patient with me.  Patient agreed to an appt this afternoon at 3:15 pm.

## 2016-10-23 LAB — BMP8+ANION GAP
Anion Gap: 16 mmol/L (ref 10.0–18.0)
BUN / CREAT RATIO: 15 (ref 10–24)
BUN: 11 mg/dL (ref 8–27)
CO2: 23 mmol/L (ref 18–29)
Calcium: 9.2 mg/dL (ref 8.6–10.2)
Chloride: 84 mmol/L — ABNORMAL LOW (ref 96–106)
Creatinine, Ser: 0.75 mg/dL — ABNORMAL LOW (ref 0.76–1.27)
GFR, EST AFRICAN AMERICAN: 106 mL/min/{1.73_m2} (ref >59)
GFR, EST NON AFRICAN AMERICAN: 92 mL/min/{1.73_m2} (ref >59)
Glucose: 115 mg/dL — ABNORMAL HIGH (ref 65–99)
POTASSIUM: 5.1 mmol/L (ref 3.5–5.2)
Sodium: 123 mmol/L — ABNORMAL LOW (ref 134–144)

## 2016-10-23 LAB — SODIUM, URINE, RANDOM: Sodium, Ur: 87 mmol/L

## 2016-10-23 LAB — OSMOLALITY, URINE: OSMOLALITY UR: 561 mosm/kg

## 2016-10-23 LAB — CREATININE, URINE, RANDOM: Creatinine, Urine: 92.2 mg/dL

## 2016-10-23 LAB — OSMOLALITY: OSMOLALITY MEAS: 256 mosm/kg — AB (ref 280–301)

## 2016-10-23 MED ORDER — LISINOPRIL 20 MG PO TABS: 20.0000 mg | ORAL_TABLET | Freq: Every day | ORAL | 0 refills | Status: DC

## 2016-10-23 NOTE — Assessment & Plan Note (Addendum)
A: Labs checked last week revealed Na of 123. Medications reviewed and lisinopril has <1% chance of causing hyponatremia. He is eating well, denies refraining from salty food. He has intermittent dizziness that he will notice while standing up but is not currently dizzy. He is not hypervolemic and labs studies negative for liver etiology. Denies orthopnea and no crackles on lung exam. Unlikely this is cardiac related.   P: further work up hyponatremia w/ BMET, plasma osm; urine cr, osm, na. Which reveals elevated urine osm and low plasma osm. 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 Will need : TSH, AM cortisol and ACTH stimulation test. Ulis Rias currently working to see where patient can get ACTH stim test.

## 2016-10-24 NOTE — Addendum Note (Signed)
Addended by: Norman Herrlich on: 10/24/2016 10:43 AM   Modules accepted: Orders, SmartSet

## 2016-10-24 NOTE — Progress Notes (Signed)
Internal Medicine Clinic Attending  Case discussed with Dr. Truong at the time of the visit.  We reviewed the resident's history and exam and pertinent patient test results.  I agree with the assessment, diagnosis, and plan of care documented in the resident's note.  

## 2016-10-25 ENCOUNTER — Telehealth: Payer: Self-pay | Admitting: Internal Medicine

## 2016-10-25 ENCOUNTER — Telehealth: Payer: Self-pay | Admitting: *Deleted

## 2016-10-25 NOTE — Telephone Encounter (Signed)
Issue with a med please call pharmacy

## 2016-10-25 NOTE — Telephone Encounter (Signed)
Per [facility]-- patient p/u lisinopril/HCTZ 20/12.5 on 2/23. Is this in addition to that dosage (lisinopril 20 mg daily ordered 10/22/16) or in its place? pls advise.

## 2016-10-25 NOTE — Telephone Encounter (Signed)
Discontinue Lisinopril/HCTZ, replace with Lisinopril only.  Thanks, Dr. Gay Filler

## 2016-10-29 NOTE — Telephone Encounter (Signed)
Called [facility] & clarified patient is prescribed lisinopril only. Lisinopril/HCTZ discontinued by MD. Per pharmacist, she will call patient & dispense latest order & deactivate old Rx.

## 2016-11-13 ENCOUNTER — Telehealth: Payer: Self-pay | Admitting: *Deleted

## 2016-11-13 NOTE — Telephone Encounter (Signed)
Male called and states pt has diarrhea for 5 days and blood in it, decreased intake, she was advised to call 911 or go to urg care or ED. She refuses and states pt does not want to go to either "that is why we come to clinic now" she states he needs a colonoscopy when he comes in, she is advised that cannot be done tomorrow pm that physician will decide what needs to be done at visit. She is advised to call 911 or take pt to ED or urg care if he c/o chest pain, shortness of breath, weakness, dizziness, severe h/a or severe abdominal pain, she was agreeable

## 2016-11-14 ENCOUNTER — Ambulatory Visit (INDEPENDENT_AMBULATORY_CARE_PROVIDER_SITE_OTHER): Payer: Medicare Other | Admitting: Internal Medicine

## 2016-11-14 VITALS — BP 100/85 | HR 80 | Temp 98.2°F | Ht 65.0 in | Wt 162.3 lb

## 2016-11-14 DIAGNOSIS — F332 Major depressive disorder, recurrent severe without psychotic features: Secondary | ICD-10-CM

## 2016-11-14 DIAGNOSIS — F322 Major depressive disorder, single episode, severe without psychotic features: Secondary | ICD-10-CM

## 2016-11-14 DIAGNOSIS — E871 Hypo-osmolality and hyponatremia: Secondary | ICD-10-CM

## 2016-11-14 DIAGNOSIS — K625 Hemorrhage of anus and rectum: Secondary | ICD-10-CM

## 2016-11-14 DIAGNOSIS — F32A Depression, unspecified: Secondary | ICD-10-CM | POA: Insufficient documentation

## 2016-11-14 DIAGNOSIS — K922 Gastrointestinal hemorrhage, unspecified: Secondary | ICD-10-CM

## 2016-11-14 DIAGNOSIS — F329 Major depressive disorder, single episode, unspecified: Secondary | ICD-10-CM | POA: Insufficient documentation

## 2016-11-14 DIAGNOSIS — R45851 Suicidal ideations: Secondary | ICD-10-CM | POA: Diagnosis not present

## 2016-11-14 MED ORDER — VENLAFAXINE HCL ER 37.5 MG PO CP24: 37.5000 mg | ORAL_CAPSULE | Freq: Every day | ORAL | 0 refills | Status: DC

## 2016-11-14 NOTE — Assessment & Plan Note (Addendum)
Patient is accompanied today by his girlfriend/wife (unclear relationship status) who is translating for the patient. Medical translator was offered but patient declined. Per girlfriend, patient has been very depressed for the past 2-3 months and expressed suicidal ideation to her on multiple occasions. She has noticed depressed mood, poor appetite, and anhedonia. Patient expressed to her earlier today that he wanted to kill himself, and told her he was planning to hang himself. When I asked the patient directly about this, he says that his suicidal thoughts are fleeting and intermittent. He denies current SI or plan. Patient says he feels safe to return home. We discussed treatment options today with SSRIs/SNRIs and patient is agreeable to starting an antidepressant. I educated patient that these medicines take 4-6 weeks to become effected. He has agreed to call 911 or call our clinic if his suicidal thoughts return. Girlfriend says she is with him almost 24/7 and feels comfortable watching him. Of note, patient is currently being worked up for hyponatremia. Although SSRIs/SNRIs have been reported to cause SIADH, I feel his current risk of suicide outweighs the risks of potential worsening hyponatremia. Patient will need close follow up and lab monitoring.  -- BMP today  -- Start Effexor 37.5 mg daily -- Follow up 2 weeks

## 2016-11-14 NOTE — Assessment & Plan Note (Addendum)
Recent lab work revealed elevated urine osm and low plasma osm. Thus this could be consistent with glucocorticoid deficiency, severe hypothyroidism, or SIADH. Will need :  -- TSH, BMP today -- ACTH stim test and AM cortisol ordered and pending  -- Follow up 2 weeks   ADDENDUM: Sodium persistently low, 122. TSH is within normal limits. 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 Will admit patient to the IM teaching service for further management. Patient will need strict fluid restriction, ACTH stim test, and AM cortisol. Discussed case with Dr. Evette Doffing. Would also recommend stopping venlafaxine until sodium is corrected to a safer range due to risk of worsening hyponatremia. Called patient and updated him on the plan and he is agreeable to admission.

## 2016-11-14 NOTE — Patient Instructions (Addendum)
Mr. Age,  I will call you today with the result of your blood work. You are scheduled to follow up with Dr. Marin Comment, a gastroenterologist at [facility] on April 10th. Please keep this appointment.  For you depression, I have started you on Effexor. Please take this medication every day. I would like for you to return to clinic in 2 weeks for follow up. If you have any questions or concerns, call our clinic at [phone] or after hours call [phone] and ask for the internal medicine resident on call. Thank you!   - Dr. Philipp Ovens

## 2016-11-14 NOTE — Progress Notes (Signed)
   CC: GI bleed, depression   HPI:  [name] is a 71 y.o. male with past medical history outlined below here for diarrhea, GI bleed, and depression. For the details of today's visit, please refer to the assessment and plan.  Past Medical History:  Diagnosis Date  . Anemia   . Anxiety   . Diabetes mellitus without complication ([place])   . Diverticulosis of colon (without mention of hemorrhage)   . GERD (gastroesophageal reflux disease)   . Hypertension     Review of Systems:  All pertinents listed in HPI, otherwise negative  Physical Exam:  Vitals:   11/14/16 1554  BP: 100/85  Pulse: 80  Temp: 98.2 F (36.8 C)  TempSrc: Oral  SpO2: 99%  Weight: 162 lb 4.8 oz (73.6 kg)  Height: 5\' 5"  (1.651 m)    Constitutional: NAD, appears comfortable Cardiovascular: RRR Pulmonary/Chest: CTAB  Abdominal: Soft, non tender, non distended. +BS.  GU: Normal rectal exam. Negative FOBT. No evidence of external hemorrhoids or fissures.  Psychiatric: Depressed mood   Assessment & Plan:   See Encounters Tab for problem based charting.  Patient discussed with Dr. Evette [name]

## 2016-11-14 NOTE — Assessment & Plan Note (Addendum)
Patient is here today with complaint of diarrhea and blood in his stool x 1 week. He reports having about 2-3 loose BM a day with blood mixed in his stool. The blood was initially very dark in color but has progressed to bright red blood. Last BM was yesterday evening. He denies N/V. He is 2 years over due for his screening colonoscopy but has an appointment scheduled with GI at [facility] on 11/27/16. Rectal exam today was normal. No evidence of external hemorrhoids or fissures. FOBT was negative. Diffuse diverticulum were noted on his last colonoscopy in 2011. 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 He has a remote history of a partial gastrectomy that was done is [place] many years ago. He is on BID iron supplementation. Today he denies lightheadedness, dizziness, chest pain, and SOB. He has required transfusions in the past for symptomatic anemia and denies experiencing any of the same symptoms. Etiology is unclear. Possibly diverticular bleed vs. AVM. Bleeding has seemed to stop for now. Patient will need to follow up with GI for diagnostic colonoscopy.  -- CBC, will follow up results  -- Follow up with GI at [facility] for scheduled appointment 4/10  -- Follow up 2 weeks

## 2016-11-15 ENCOUNTER — Telehealth: Payer: Self-pay | Admitting: *Deleted

## 2016-11-15 ENCOUNTER — Inpatient Hospital Stay (HOSPITAL_COMMUNITY)
Admission: AD | Admit: 2016-11-15 | Discharge: 2016-11-19 | DRG: 644 | Disposition: A | Discharge status: Home / Self Care | Payer: Medicare Other | Source: Ambulatory Visit | Attending: Student in an Organized Health Care Education/Training Program | Admitting: Student in an Organized Health Care Education/Training Program

## 2016-11-15 ENCOUNTER — Encounter (HOSPITAL_COMMUNITY): Payer: Self-pay | Admitting: Internal Medicine

## 2016-11-15 DIAGNOSIS — Z8049 Family history of malignant neoplasm of other genital organs: Secondary | ICD-10-CM

## 2016-11-15 DIAGNOSIS — E222 Syndrome of inappropriate secretion of antidiuretic hormone: Principal | ICD-10-CM

## 2016-11-15 DIAGNOSIS — E119 Type 2 diabetes mellitus without complications: Secondary | ICD-10-CM | POA: Diagnosis present

## 2016-11-15 DIAGNOSIS — K5900 Constipation, unspecified: Secondary | ICD-10-CM | POA: Diagnosis present

## 2016-11-15 DIAGNOSIS — E875 Hyperkalemia: Secondary | ICD-10-CM | POA: Diagnosis present

## 2016-11-15 DIAGNOSIS — R197 Diarrhea, unspecified: Secondary | ICD-10-CM | POA: Diagnosis not present

## 2016-11-15 DIAGNOSIS — Z634 Disappearance and death of family member: Secondary | ICD-10-CM

## 2016-11-15 DIAGNOSIS — K922 Gastrointestinal hemorrhage, unspecified: Secondary | ICD-10-CM | POA: Diagnosis present

## 2016-11-15 DIAGNOSIS — Z79899 Other long term (current) drug therapy: Secondary | ICD-10-CM

## 2016-11-15 DIAGNOSIS — F32A Depression, unspecified: Secondary | ICD-10-CM | POA: Diagnosis present

## 2016-11-15 DIAGNOSIS — Z87820 Personal history of traumatic brain injury: Secondary | ICD-10-CM | POA: Diagnosis not present

## 2016-11-15 DIAGNOSIS — F329 Major depressive disorder, single episode, unspecified: Secondary | ICD-10-CM | POA: Diagnosis present

## 2016-11-15 DIAGNOSIS — R05 Cough: Secondary | ICD-10-CM

## 2016-11-15 DIAGNOSIS — Z7982 Long term (current) use of aspirin: Secondary | ICD-10-CM

## 2016-11-15 DIAGNOSIS — Z833 Family history of diabetes mellitus: Secondary | ICD-10-CM | POA: Diagnosis not present

## 2016-11-15 DIAGNOSIS — Z87891 Personal history of nicotine dependence: Secondary | ICD-10-CM | POA: Diagnosis not present

## 2016-11-15 DIAGNOSIS — R059 Cough, unspecified: Secondary | ICD-10-CM

## 2016-11-15 DIAGNOSIS — Z803 Family history of malignant neoplasm of breast: Secondary | ICD-10-CM

## 2016-11-15 DIAGNOSIS — Z7984 Long term (current) use of oral hypoglycemic drugs: Secondary | ICD-10-CM

## 2016-11-15 DIAGNOSIS — K219 Gastro-esophageal reflux disease without esophagitis: Secondary | ICD-10-CM | POA: Diagnosis present

## 2016-11-15 DIAGNOSIS — R630 Anorexia: Secondary | ICD-10-CM | POA: Diagnosis present

## 2016-11-15 DIAGNOSIS — I10 Essential (primary) hypertension: Secondary | ICD-10-CM | POA: Diagnosis present

## 2016-11-15 DIAGNOSIS — Z8249 Family history of ischemic heart disease and other diseases of the circulatory system: Secondary | ICD-10-CM

## 2016-11-15 DIAGNOSIS — D509 Iron deficiency anemia, unspecified: Secondary | ICD-10-CM | POA: Diagnosis present

## 2016-11-15 DIAGNOSIS — E871 Hypo-osmolality and hyponatremia: Secondary | ICD-10-CM | POA: Diagnosis present

## 2016-11-15 HISTORY — DX: Unspecified intracranial injury with loss of consciousness status unknown, initial encounter: S06.9XAA

## 2016-11-15 HISTORY — DX: Unspecified intracranial injury with loss of consciousness of unspecified duration, initial encounter: S06.9X9A

## 2016-11-15 LAB — BMP8+ANION GAP
Anion Gap: 13 mmol/L (ref 10.0–18.0)
BUN / CREAT RATIO: 16 (ref 10–24)
BUN: 12 mg/dL (ref 8–27)
CO2: 27 mmol/L (ref 18–29)
CREATININE: 0.73 mg/dL — AB (ref 0.76–1.27)
Calcium: 9.4 mg/dL (ref 8.6–10.2)
Chloride: 82 mmol/L — ABNORMAL LOW (ref 96–106)
GFR calc Af Amer: 107 mL/min/{1.73_m2} (ref >59)
GFR, EST NON AFRICAN AMERICAN: 93 mL/min/{1.73_m2} (ref >59)
Glucose: 98 mg/dL (ref 65–99)
Potassium: 5.5 mmol/L — ABNORMAL HIGH (ref 3.5–5.2)
SODIUM: 122 mmol/L — AB (ref 134–144)

## 2016-11-15 LAB — BASIC METABOLIC PANEL
ANION GAP: 7 (ref 5–15)
ANION GAP: 8 (ref 5–15)
BUN: 12 mg/dL (ref 6–20)
BUN: 12 mg/dL (ref 6–20)
CHLORIDE: 86 mmol/L — AB (ref 101–111)
CHLORIDE: 87 mmol/L — AB (ref 101–111)
CO2: 27 mmol/L (ref 22–32)
CO2: 27 mmol/L (ref 22–32)
CREATININE: 0.74 mg/dL (ref 0.61–1.24)
Calcium: 9 mg/dL (ref 8.9–10.3)
Calcium: 9.1 mg/dL (ref 8.9–10.3)
Creatinine, Ser: 0.75 mg/dL (ref 0.61–1.24)
GFR calc non Af Amer: >60 mL/min (ref >60)
GFR calc non Af Amer: >60 mL/min (ref >60)
Glucose, Bld: 130 mg/dL — ABNORMAL HIGH (ref 65–99)
Glucose, Bld: 131 mg/dL — ABNORMAL HIGH (ref 65–99)
POTASSIUM: 4.6 mmol/L (ref 3.5–5.1)
POTASSIUM: 4.8 mmol/L (ref 3.5–5.1)
Sodium: 121 mmol/L — ABNORMAL LOW (ref 135–145)
Sodium: 121 mmol/L — ABNORMAL LOW (ref 135–145)

## 2016-11-15 LAB — CBC
HCT: 33.9 % — ABNORMAL LOW (ref 39.0–52.0)
HEMOGLOBIN: 11.7 g/dL — AB (ref 13.0–17.0)
MCH: 27.1 pg (ref 26.0–34.0)
MCHC: 34.5 g/dL (ref 30.0–36.0)
MCV: 78.7 fL (ref 78.0–100.0)
Platelets: 254 10*3/uL (ref 150–400)
RBC: 4.31 MIL/uL (ref 4.22–5.81)
RDW: 15.1 % (ref 11.5–15.5)
WBC: 5.6 10*3/uL (ref 4.0–10.5)

## 2016-11-15 LAB — IRON AND TIBC
Iron: 34 ug/dL — ABNORMAL LOW (ref 45–182)
SATURATION RATIOS: 9 % — AB (ref 17.9–39.5)
TIBC: 363 ug/dL (ref 250–450)
UIBC: 329 ug/dL

## 2016-11-15 LAB — RETICULOCYTES
RBC.: 4.31 MIL/uL (ref 4.22–5.81)
Retic Count, Absolute: 47.4 10*3/uL (ref 19.0–186.0)
Retic Ct Pct: 1.1 % (ref 0.4–3.1)

## 2016-11-15 LAB — NO LAVENDER RECEIVED

## 2016-11-15 LAB — CORTISOL-AM, BLOOD: Cortisol - AM: 7.4 ug/dL (ref 6.7–22.6)

## 2016-11-15 LAB — OSMOLALITY: OSMOLALITY: 257 mosm/kg — AB (ref 275–295)

## 2016-11-15 LAB — GLUCOSE, CAPILLARY: Glucose-Capillary: 108 mg/dL — ABNORMAL HIGH (ref 65–99)

## 2016-11-15 LAB — TSH: TSH: 1.43 u[IU]/mL (ref 0.450–4.500)

## 2016-11-15 LAB — FERRITIN: Ferritin: 22 ng/mL — ABNORMAL LOW (ref 24–336)

## 2016-11-15 MED ORDER — PANTOPRAZOLE SODIUM 40 MG PO TBEC: 40.0000 mg | DELAYED_RELEASE_TABLET | Freq: Every day | ORAL | Status: DC

## 2016-11-15 MED ORDER — ATORVASTATIN CALCIUM 80 MG PO TABS
80.0000 mg | ORAL_TABLET | Freq: Every day | ORAL | Status: DC
  Administered 2016-11-15 – 2016-11-19 (×5): 80 mg via ORAL
  Filled 2016-11-15 (×5): qty 1

## 2016-11-15 MED ORDER — VENLAFAXINE HCL ER 37.5 MG PO CP24: 37.5000 mg | ORAL_CAPSULE | Freq: Every day | ORAL | Status: DC

## 2016-11-15 MED ORDER — COSYNTROPIN 0.25 MG IJ SOLR
0.2500 mg | Freq: Once | INTRAMUSCULAR | Status: AC
  Administered 2016-11-16: 0.25 mg via INTRAVENOUS
  Filled 2016-11-15 (×2): qty 0.25

## 2016-11-15 MED ORDER — FERROUS SULFATE 325 (65 FE) MG PO TABS
325.0000 mg | ORAL_TABLET | Freq: Two times a day (BID) | ORAL | Status: DC
  Administered 2016-11-15 – 2016-11-19 (×8): 325 mg via ORAL
  Filled 2016-11-15 (×8): qty 1

## 2016-11-15 MED ORDER — NITROGLYCERIN 0.4 MG SL SUBL: 0.4000 mg | SUBLINGUAL_TABLET | Freq: Every day | SUBLINGUAL | Status: DC

## 2016-11-15 MED ORDER — DOCUSATE SODIUM 100 MG PO CAPS: 100.0000 mg | ORAL_CAPSULE | Freq: Two times a day (BID) | ORAL | Status: DC

## 2016-11-15 MED ORDER — TAMSULOSIN HCL 0.4 MG PO CAPS
0.4000 mg | ORAL_CAPSULE | Freq: Every day | ORAL | Status: DC
  Administered 2016-11-16 – 2016-11-19 (×4): 0.4 mg via ORAL
  Filled 2016-11-15 (×4): qty 1

## 2016-11-15 MED ORDER — ONDANSETRON HCL 4 MG PO TABS: 4.0000 mg | ORAL_TABLET | Freq: Four times a day (QID) | ORAL | Status: DC | PRN

## 2016-11-15 MED ORDER — METOPROLOL SUCCINATE ER 50 MG PO TB24
50.0000 mg | ORAL_TABLET | Freq: Every day | ORAL | Status: DC
  Administered 2016-11-16 – 2016-11-19 (×4): 50 mg via ORAL
  Filled 2016-11-15 (×4): qty 1

## 2016-11-15 MED ORDER — ONDANSETRON HCL 4 MG/2ML IJ SOLN
4.0000 mg | Freq: Four times a day (QID) | INTRAMUSCULAR | Status: DC | PRN
  Administered 2016-11-18 (×2): 4 mg via INTRAVENOUS
  Filled 2016-11-15 (×2): qty 2

## 2016-11-15 MED ORDER — INSULIN ASPART 100 UNIT/ML ~~LOC~~ SOLN
0.0000 [IU] | Freq: Three times a day (TID) | SUBCUTANEOUS | Status: DC
  Administered 2016-11-16 – 2016-11-18 (×3): 2 [IU] via SUBCUTANEOUS
  Administered 2016-11-19: 3 [IU] via SUBCUTANEOUS

## 2016-11-15 NOTE — Progress Notes (Signed)
Internal Medicine Clinic Attending  Case discussed with Dr. Philipp Ovens at the time of the visit.  We reviewed the resident's history and exam and pertinent patient test results.  I agree with the assessment, diagnosis, and plan of care documented in the resident's note.  This is a complex geriatric patient presenting for BRBPR, but also with a two month history of progressive hyponatremia and depression with high risk features. The BRBPR clinically sounds like either a self limited diverticular bleed or maybe an AVM and will need colonoscopy evaluation. CBC is still pending, but vitals were without tachycardia or hypotension, and no orthostatic symptoms, so no clinical signs of hypovolemia. The hyponatremia is at least moderate and probably explains many of his symptoms of depressed mood, fatigue, and anhedonia. 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 TSH normal today, still need to rule out AI. Given progressive symptoms of moderate hyponatremia and no improvement with outpatient interventions, I think we will need to call the patient for admission to fluid restrict and carefully raise his sodium. The depression does need treatment, but after reviewing the literature I think the risk of even venlafaxine to worsen the hyponatremia more is too high to start it. If the sodium improves with fluid restriction, then he may be a candidate for a non-ssri agent.

## 2016-11-15 NOTE — Progress Notes (Signed)
[name] is a 71 y.o. male patient admitted from ED awake, alert - oriented  X 4 - no acute distress noted.  VSS - Blood pressure (!) 141/77, pulse 86, temperature 98.2 F (36.8 C), temperature source Oral, SpO2 97 %.    IV in place, occlusive dsg intact without redness.  Orientation to room, and floor completed with information packet given to patient/family.  Patient declined safety video at this time.  Admission INP armband ID verified with patient/family, and in place.   SR up x 2, fall assessment complete, with patient and family able to verbalize understanding of risk associated with falls, and verbalized understanding to call nsg before up out of bed.  Call light within reach, patient able to voice, and demonstrate understanding.  Skin, clean-dry- intact without evidence of bruising, or skin tears.   No evidence of skin break down noted on exam.     Will cont to eval and treat per MD orders.  Luci Bank, RN 11/15/2016 6:33 PM

## 2016-11-15 NOTE — Telephone Encounter (Signed)
[name],Pt Placement, stated pt has been called and is going to 5W.

## 2016-11-15 NOTE — Telephone Encounter (Signed)
Called Pt Placement before lunch - no beds available.  Called Pt Placement to f/u - talked to [place]. Stated no med surg beds at this time; but stated some beds,hopefully, will be opening up on 5W.

## 2016-11-15 NOTE — Telephone Encounter (Signed)
Called Pt Placement , talked to [place]. Requested reg bed for direct admission from home per Dr Evette Doffing. [facility] stated no available beds at this time but will call pt when bed becomes available.  Called back to give name/number of contact person if unable to reach pt who speaks Spanish.  Lum Babe, # [phone] per Dr Philipp Ovens.
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
## 2016-11-16 ENCOUNTER — Inpatient Hospital Stay (HOSPITAL_COMMUNITY): Payer: Medicare Other

## 2016-11-16 ENCOUNTER — Encounter (HOSPITAL_COMMUNITY): Payer: Self-pay | Admitting: *Deleted

## 2016-11-16 DIAGNOSIS — D509 Iron deficiency anemia, unspecified: Secondary | ICD-10-CM

## 2016-11-16 DIAGNOSIS — E871 Hypo-osmolality and hyponatremia: Secondary | ICD-10-CM

## 2016-11-16 DIAGNOSIS — K922 Gastrointestinal hemorrhage, unspecified: Secondary | ICD-10-CM

## 2016-11-16 LAB — BASIC METABOLIC PANEL
ANION GAP: 10 (ref 5–15)
ANION GAP: 9 (ref 5–15)
ANION GAP: 8 (ref 5–15)
ANION GAP: 8 (ref 5–15)
Anion gap: 10 (ref 5–15)
BUN: 10 mg/dL (ref 6–20)
BUN: 10 mg/dL (ref 6–20)
BUN: 11 mg/dL (ref 6–20)
BUN: 15 mg/dL (ref 6–20)
BUN: 13 mg/dL (ref 6–20)
CHLORIDE: 86 mmol/L — AB (ref 101–111)
CHLORIDE: 87 mmol/L — AB (ref 101–111)
CO2: 23 mmol/L (ref 22–32)
CO2: 25 mmol/L (ref 22–32)
CO2: 25 mmol/L (ref 22–32)
CO2: 23 mmol/L (ref 22–32)
CO2: 27 mmol/L (ref 22–32)
CREATININE: 0.7 mg/dL (ref 0.61–1.24)
CREATININE: 0.7 mg/dL (ref 0.61–1.24)
CREATININE: 0.78 mg/dL (ref 0.61–1.24)
Calcium: 8.8 mg/dL — ABNORMAL LOW (ref 8.9–10.3)
Calcium: 8.9 mg/dL (ref 8.9–10.3)
Calcium: 9 mg/dL (ref 8.9–10.3)
Calcium: 8.8 mg/dL — ABNORMAL LOW (ref 8.9–10.3)
Calcium: 9.2 mg/dL (ref 8.9–10.3)
Chloride: 87 mmol/L — ABNORMAL LOW (ref 101–111)
Chloride: 89 mmol/L — ABNORMAL LOW (ref 101–111)
Chloride: 86 mmol/L — ABNORMAL LOW (ref 101–111)
Creatinine, Ser: 0.85 mg/dL (ref 0.61–1.24)
Creatinine, Ser: 0.87 mg/dL (ref 0.61–1.24)
GFR calc Af Amer: >60 mL/min (ref >60)
GFR calc non Af Amer: >60 mL/min (ref >60)
GFR calc non Af Amer: >60 mL/min (ref >60)
GLUCOSE: 92 mg/dL (ref 65–99)
Glucose, Bld: 86 mg/dL (ref 65–99)
Glucose, Bld: 99 mg/dL (ref 65–99)
Glucose, Bld: 139 mg/dL — ABNORMAL HIGH (ref 65–99)
Glucose, Bld: 109 mg/dL — ABNORMAL HIGH (ref 65–99)
POTASSIUM: 4.2 mmol/L (ref 3.5–5.1)
POTASSIUM: 4.2 mmol/L (ref 3.5–5.1)
POTASSIUM: 4.6 mmol/L (ref 3.5–5.1)
POTASSIUM: 5 mmol/L (ref 3.5–5.1)
Potassium: 4.5 mmol/L (ref 3.5–5.1)
SODIUM: 119 mmol/L — AB (ref 135–145)
SODIUM: 120 mmol/L — AB (ref 135–145)
SODIUM: 121 mmol/L — AB (ref 135–145)
Sodium: 121 mmol/L — ABNORMAL LOW (ref 135–145)
Sodium: 122 mmol/L — ABNORMAL LOW (ref 135–145)

## 2016-11-16 LAB — SODIUM, URINE, RANDOM: SODIUM UR: 80 mmol/L

## 2016-11-16 LAB — GLUCOSE, CAPILLARY
Glucose-Capillary: 102 mg/dL — ABNORMAL HIGH (ref 65–99)
Glucose-Capillary: 127 mg/dL — ABNORMAL HIGH (ref 65–99)
Glucose-Capillary: 120 mg/dL — ABNORMAL HIGH (ref 65–99)

## 2016-11-16 LAB — ACTH STIMULATION, 3 TIME POINTS
CORTISOL 60 MIN: 25.9 ug/dL
CORTISOL BASE: 11.3 ug/dL
Cortisol, 30 Min: 22.2 ug/dL

## 2016-11-16 LAB — OSMOLALITY, URINE: OSMOLALITY UR: 522 mosm/kg (ref 300–900)

## 2016-11-16 LAB — CORTISOL-AM, BLOOD: Cortisol - AM: 11.4 ug/dL (ref 6.7–22.6)

## 2016-11-16 MED ORDER — SODIUM CHLORIDE 1 G PO TABS
1.0000 g | ORAL_TABLET | Freq: Two times a day (BID) | ORAL | Status: DC
  Administered 2016-11-16 – 2016-11-17 (×2): 1 g via ORAL
  Filled 2016-11-16 (×2): qty 1

## 2016-11-16 MED ORDER — SODIUM CHLORIDE 0.9 % IV SOLN
510.0000 mg | Freq: Once | INTRAVENOUS | Status: AC
  Administered 2016-11-16: 510 mg via INTRAVENOUS
  Filled 2016-11-16: qty 17

## 2016-11-16 MED ORDER — SENNOSIDES-DOCUSATE SODIUM 8.6-50 MG PO TABS
2.0000 | ORAL_TABLET | Freq: Two times a day (BID) | ORAL | Status: DC
  Administered 2016-11-16 – 2016-11-17 (×3): 2 via ORAL
  Filled 2016-11-16 (×5): qty 2

## 2016-11-16 MED ORDER — SODIUM CHLORIDE 0.9 % IV SOLN
INTRAVENOUS | Status: DC
Start: 2016-11-16 — End: 2016-11-16

## 2016-11-16 MED ORDER — SODIUM CHLORIDE 1 G PO TABS
1.0000 g | ORAL_TABLET | Freq: Once | ORAL | Status: AC
  Administered 2016-11-16: 1 g via ORAL
  Filled 2016-11-16: qty 1

## 2016-11-16 MED ORDER — GADOBENATE DIMEGLUMINE 529 MG/ML IV SOLN
15.0000 mL | Freq: Once | INTRAVENOUS | Status: AC | PRN
  Administered 2016-11-16: 15 mL via INTRAVENOUS

## 2016-11-16 NOTE — Progress Notes (Addendum)
Subjective: Feels much better today, mood much improved since yesterday evening.  Still hasn't had a bowel movement and is feeling constipated.  Wants to shower.  Objective:  Vital signs in last 24 hours: Vitals:   11/15/16 1816 11/15/16 2100 11/16/16 0725  BP: (!) 141/77 140/75 133/66  Pulse: 86 80 77  Resp:   18  Temp: 98.2 F (36.8 C) 98.2 F (36.8 C) 98 F (36.7 C)  TempSrc: Oral Oral Oral  SpO2: 97% 100% 97%   Physical Exam  Constitutional: He is oriented to person, place, and time. He appears well-developed and well-nourished. No distress.  Cardiovascular: Normal rate and regular rhythm.   Pulmonary/Chest: Effort normal and breath sounds normal.  Neurological: He is alert and oriented to person, place, and time.  Psychiatric: He has a normal mood and affect. His behavior is normal.   BMP Latest Ref Rng & Units 11/16/2016 11/16/2016 11/15/2016  Glucose 65 - 99 mg/dL 99 86 131(H)  BUN 6 - 20 mg/dL 10 10 12   Creatinine 0.61 - 1.24 mg/dL 0.70 0.70 0.75  BUN/Creat Ratio 10 - 24 - - -  Sodium 135 - 145 mmol/L 121(L) 119(LL) 121(L)  Potassium 3.5 - 5.1 mmol/L 4.2 4.2 4.6  Chloride 101 - 111 mmol/L 87(L) 86(L) 87(L)  CO2 22 - 32 mmol/L 25 23 27   Calcium 8.9 - 10.3 mg/dL 8.9 8.8(L) 9.0   Component     Latest Ref Rng & Units 11/16/2016  Cortisol, Base     ug/dL 11.3  Cortisol, 30 Min     ug/dL 22.2  Cortisol, 60 Min     ug/dL 25.9   Serum Osm 257 Urine Osm 522 Urine Na 80  Iron/TIBC/Ferritin/ %Sat    Component Value Date/Time   IRON 34 (L) 11/15/2016 2030   TIBC 363 11/15/2016 2030   FERRITIN 22 (L) 11/15/2016 2030   IRONPCTSAT 9 (L) 11/15/2016 2030    Assessment/Plan:  Principal Problem:   Hyponatremia Active Problems:   Essential hypertension   Type 2 diabetes mellitus without complication, without long-term current use of insulin (HCC)   Depression   71 y.o. male with chronic hyponatremia likely causing generalized weakness and depression.  Clinically  euvolemic and normotensive, with hypoosomolar serum and inappropriately concentrated urine consistent with SIADH.  Possible causes include medications and malignancy.  He has only mild vague symptoms which may or may not be due to his hyponatremia and are improved today without change in his Na.  #Chronic Hypoosmolar Hyponatremia Inappropriately concentrated urine early in the month suggestive of ADH-dependent mechanism and SIADH.  SNRIs are a common culprit but he just started venlafaxine yesterday.  Mild hyperkalemia would consistent with AI, but he is hypertensive.  Normal TSH, hypothyroidism unlikely.  He may have poor solute intake relate to free water consumption, but studies early in the month were more indicative of SIADH.  Will hold possibly offending meds (SNRI, PPI) and order chest CT to evaluate for malignancy in former smoker.  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 -Trend BMP -Fluid restrict -Check urine and serum Osm, urine Na -BMP Q4H -Hold SNRI, PPI -Chest CT -ACTH stim test tomorrow  #Anemia #GI Bleeding Mild microcytic anemia, iron deficiency due to possible GI bleeding. -PO iron supplementation -Outpatient GI follow-up  #Depression Denies SI/HI. 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   Minus Liberty, MD 11/16/2016, 11:09 AM Pager: [phone]

## 2016-11-16 NOTE — Progress Notes (Signed)
Patient sodium level 119 this morning.Call placed to [place]. MD to review chart and place new orders.Will continue to monitor.

## 2016-11-16 NOTE — Progress Notes (Signed)
  Date: 11/16/2016  Patient name: [name]  Medical record number: [id number]  Date of birth: 1945-11-05   I have seen and evaluated Teal Cid-Prado and discussed their care with the Residency Team. In brief, patient is a 71 year old male with past medical history of diabetes, hypertension, diverticulosis and depression who presented to [facility] with worsening generalized weakness and fatigue over the last 2 months.  Over the last 2 months patient has noted insidious onset of fatigue and generalized weakness associated with depression. He was also noted to have poor appetite eating 1-2 meals per day some days and some days not eating at all. He also noted intermittent abdominal pain and constipation. 2 days ago he also noted bright red blood mixed with the stool and has noted some black stools prior to that. In [facility] he had blood work done which showed moderate hyponatremia which was thought to be partly responsible for his symptoms. He has been noted to be hyponatremic over the last month in [facility] but given his worsening symptoms and persistent hyponatremia he was admitted to [facility] for further workup.  Patient states he feels much better today and that his mood is improved as well as his appetite. Of note, patient states he has noted a dry cough which he has had chronically but is worsened over the last month.  PMHx, Fam Hx, and/or Soc Hx : As per resident admit note  Vitals:   11/16/16 0725 11/16/16 1244  BP: 133/66 127/82  Pulse: 77 77  Resp: 18 16  Temp: 98 F (36.7 C) 98.1 F (36.7 C)   Gen.: Awake alert and oriented 3, NAD CVS: Regular rate and rhythm, normal heart sounds Lungs: CTA bilaterally Abdomen: Soft, nontender, nondistended, normoactive bowel sounds Extremities: No edema  Assessment and Plan: I have seen and evaluated the patient as outlined above. I agree with the formulated Assessment and Plan as detailed in the residents' note, with the following changes:    1. Symptomatic hyponatremia: - Patient was admitted for likely symptomatic hyponatremia from [facility]. Patient has noted progressive fatigue and generalized weakness as well as depressed mood over the last couple of months and has had persistent hyponatremia over the last month. Workup done in [facility] revealed hypo-osmolar hyponatremia with urine sodium of 80 and a urine osmolality of 522. This is consistent with SIADH. TSH was within normal limits and ACTH stimulation test was normal as well ruling out adrenal insufficiency. - Patient is now on by mouth fluid restriction of 1200 mL as well as salt tablets 1 g twice a day - We'll monitor BMP every 4 hours - Given patient's chronic cough which is worsening over the last month and his SIADH and history of smoking CT chest was obtained which showed no evidence of malignancy. - We'll hold PPI for now and will not start him on venlafaxine given his hyponatremia  2. Anemia: - Patient was also noted to have mild microcytic anemia with iron deficiency likely secondary to a GI bleed. - His vitals are stable and he does not have signs of an active bleed currently - He will need outpatient colonoscopy for further evaluation - Given his iron deficiency we'll transfuse Feraheme today and start by mouth iron supplementation    Aldine Contes, MD 3/30/20181:09 PM

## 2016-11-16 NOTE — Evaluation (Signed)
Physical Therapy Evaluation Patient Details Name: [name] MRN: [id number] DOB: [birth year] Today's Date: 11/16/2016   History of Present Illness  [name] is a 71 y.o. male with history of DM2, HTN, diverticulosis, and depression who presents with chronic worsening generalized weakness and fatigue.  Patient with hyponatremia and anemia.      Clinical Impression  Patient is functioning at Independent level with mobility and gait.  Patient with no loss of balance during gait, when picking up bag from floor.  No acute PT needs identified - PT will sign off.  [employer] used via phone [name] [phone]) during PT session.    Follow Up Recommendations No PT follow up;Supervision - Intermittent    Equipment Recommendations  None recommended by PT    Recommendations for Other Services       Precautions / Restrictions Precautions Precautions: None Restrictions Weight Bearing Restrictions: No      Mobility  Bed Mobility               General bed mobility comments: Patient standing in room as PT entered.  Transfers Overall transfer level: Independent Equipment used: None             General transfer comment: No assist needed for sit <> stand.  Ambulation/Gait Ambulation/Gait assistance: Independent Ambulation Distance (Feet): 175 Feet Assistive device: None Gait Pattern/deviations: WFL(Within Functional Limits) Gait velocity: WFL Gait velocity interpretation: at or above normal speed for age/gender General Gait Details: Patient with good gait pattern, balance, and speed.  Patient ambulating in room on his own.  Stairs            Wheelchair Mobility    Modified Rankin (Stroke Patients Only)       Balance Overall balance assessment: No apparent balance deficits (not formally assessed)                                           Pertinent Vitals/Pain Pain Assessment: No/denies pain    Home Living  Family/patient expects to be discharged to:: Private residence Living Arrangements: Alone Available Help at Discharge: Family;Available PRN/intermittently (Granddaughter assists with transportation) Type of Home: Apartment Home Access: Level entry;Ramped entrance     Home Layout: One level Home Equipment: None      Prior Function Level of Independence: Independent         Comments: 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  Granddaughter assists with transportation.     Hand Dominance   Dominant Hand: Right    Extremity/Trunk Assessment   Upper Extremity Assessment Upper Extremity Assessment: Overall WFL for tasks assessed    Lower Extremity Assessment Lower Extremity Assessment: Overall WFL for tasks assessed    Cervical / Trunk Assessment Cervical / Trunk Assessment: Normal  Communication   Communication: Prefers language other than English ([place].  Interpreter ([place]) Geophysical data processor by phone)  Cognition Arousal/Alertness: Awake/alert Behavior During Therapy: WFL for tasks assessed/performed Overall Cognitive Status: Within Functional Limits for tasks assessed                                        General Comments      Exercises     Assessment/Plan    PT Assessment Patent does not need any further PT services  PT Problem List  PT Treatment Interventions      PT Goals (Current goals can be found in the Care Plan section)  Acute Rehab PT Goals PT Goal Formulation: All assessment and education complete, DC therapy    Frequency     Barriers to discharge        Co-evaluation               End of Session   Activity Tolerance: Patient tolerated treatment well Patient left: in bed;with call bell/phone within reach (sitting EOB) Nurse Communication: Mobility status PT Visit Diagnosis: Muscle weakness (generalized) (M62.81)    Time: 3578-9784 PT Time Calculation (min) (ACUTE ONLY): 12 min   Charges:   PT  Evaluation $PT Eval Low Complexity: 1 Procedure     PT G Codes:        Carita Pian. Sanjuana Kava, [facility] Acute Rehab Services Pager [place] 11/16/2016, 6:39 PM

## 2016-11-17 ENCOUNTER — Encounter (HOSPITAL_COMMUNITY): Payer: Self-pay

## 2016-11-17 LAB — BASIC METABOLIC PANEL
ANION GAP: 8 (ref 5–15)
ANION GAP: 7 (ref 5–15)
Anion gap: 9 (ref 5–15)
Anion gap: 11 (ref 5–15)
Anion gap: 10 (ref 5–15)
BUN: 12 mg/dL (ref 6–20)
BUN: 12 mg/dL (ref 6–20)
BUN: 13 mg/dL (ref 6–20)
BUN: 13 mg/dL (ref 6–20)
BUN: 13 mg/dL (ref 6–20)
CALCIUM: 8.6 mg/dL — AB (ref 8.9–10.3)
CALCIUM: 8.8 mg/dL — AB (ref 8.9–10.3)
CHLORIDE: 88 mmol/L — AB (ref 101–111)
CHLORIDE: 87 mmol/L — AB (ref 101–111)
CHLORIDE: 88 mmol/L — AB (ref 101–111)
CO2: 23 mmol/L (ref 22–32)
CO2: 23 mmol/L (ref 22–32)
CO2: 25 mmol/L (ref 22–32)
CO2: 25 mmol/L (ref 22–32)
CO2: 27 mmol/L (ref 22–32)
CREATININE: 0.83 mg/dL (ref 0.61–1.24)
CREATININE: 0.84 mg/dL (ref 0.61–1.24)
CREATININE: 0.84 mg/dL (ref 0.61–1.24)
CREATININE: 0.86 mg/dL (ref 0.61–1.24)
CREATININE: 0.77 mg/dL (ref 0.61–1.24)
Calcium: 9.3 mg/dL (ref 8.9–10.3)
Calcium: 8.9 mg/dL (ref 8.9–10.3)
Calcium: 9 mg/dL (ref 8.9–10.3)
Chloride: 90 mmol/L — ABNORMAL LOW (ref 101–111)
Chloride: 91 mmol/L — ABNORMAL LOW (ref 101–111)
GFR calc Af Amer: >60 mL/min (ref >60)
GFR calc Af Amer: >60 mL/min (ref >60)
GFR calc non Af Amer: >60 mL/min (ref >60)
GFR calc non Af Amer: >60 mL/min (ref >60)
GFR calc non Af Amer: >60 mL/min (ref >60)
Glucose, Bld: 100 mg/dL — ABNORMAL HIGH (ref 65–99)
Glucose, Bld: 104 mg/dL — ABNORMAL HIGH (ref 65–99)
Glucose, Bld: 108 mg/dL — ABNORMAL HIGH (ref 65–99)
Glucose, Bld: 93 mg/dL (ref 65–99)
Glucose, Bld: 96 mg/dL (ref 65–99)
POTASSIUM: 3.9 mmol/L (ref 3.5–5.1)
POTASSIUM: 4.2 mmol/L (ref 3.5–5.1)
Potassium: 4 mmol/L (ref 3.5–5.1)
Potassium: 4.1 mmol/L (ref 3.5–5.1)
Potassium: 4.5 mmol/L (ref 3.5–5.1)
SODIUM: 123 mmol/L — AB (ref 135–145)
SODIUM: 123 mmol/L — AB (ref 135–145)
Sodium: 123 mmol/L — ABNORMAL LOW (ref 135–145)
Sodium: 122 mmol/L — ABNORMAL LOW (ref 135–145)
Sodium: 121 mmol/L — ABNORMAL LOW (ref 135–145)

## 2016-11-17 LAB — C DIFFICILE QUICK SCREEN W PCR REFLEX
C DIFFICILE (CDIFF) INTERP: NOT DETECTED
C DIFFICLE (CDIFF) ANTIGEN: NEGATIVE
C Diff toxin: NEGATIVE

## 2016-11-17 LAB — GLUCOSE, CAPILLARY
GLUCOSE-CAPILLARY: 96 mg/dL (ref 65–99)
Glucose-Capillary: 96 mg/dL (ref 65–99)
Glucose-Capillary: 146 mg/dL — ABNORMAL HIGH (ref 65–99)
Glucose-Capillary: 125 mg/dL — ABNORMAL HIGH (ref 65–99)

## 2016-11-17 MED ORDER — SODIUM CHLORIDE 1 G PO TABS
2.0000 g | ORAL_TABLET | Freq: Two times a day (BID) | ORAL | Status: DC
  Administered 2016-11-17 – 2016-11-19 (×5): 2 g via ORAL
  Filled 2016-11-17 (×6): qty 2

## 2016-11-17 NOTE — Progress Notes (Signed)
Subjective:  No acute events overnight. Feels well.  Denies nausea or vomiting  Has had 5 episodes  Of watery nonbloody diarrhea yesterday and one episode this morning. Denies abdominal pain.    Objective:  Vital signs in last 24 hours: Vitals:   11/16/16 0725 11/16/16 1244 11/16/16 2128 11/17/16 0549  BP: 133/66 127/82 (!) 156/85 111/63  Pulse: 77 77 74 71  Resp: 18 16 17 17   Temp: 98 F (36.7 C) 98.1 F (36.7 C) 98.1 F (36.7 C) 98.6 F (37 C)  TempSrc: Oral Oral Oral Oral  SpO2: 97% 97% 100% 97%   Physical Exam  Constitutional: He is oriented to person, place, and time. He appears well-developed and well-nourished. No distress.  Cardiovascular: Normal rate and regular rhythm.   Pulmonary/Chest: Effort normal and breath sounds normal.  Neurological: He is alert and oriented to person, place, and time.  Psychiatric: He has a normal mood and affect. His behavior is normal.   BMP Latest Ref Rng & Units 11/17/2016 11/17/2016 11/16/2016  Glucose 65 - 99 mg/dL 93 100(H) 109(H)  BUN 6 - 20 mg/dL 13 13 13   Creatinine 0.61 - 1.24 mg/dL 0.77 0.86 0.87  BUN/Creat Ratio 10 - 24 - - -  Sodium 135 - 145 mmol/L 122(L) 123(L) 121(L)  Potassium 3.5 - 5.1 mmol/L 3.9 4.5 5.0  Chloride 101 - 111 mmol/L 88(L) 87(L) 86(L)  CO2 22 - 32 mmol/L 23 27 27   Calcium 8.9 - 10.3 mg/dL 8.9 9.3 9.2   Component     Latest Ref Rng & Units 11/16/2016  Cortisol, Base     ug/dL 11.3  Cortisol, 30 Min     ug/dL 22.2  Cortisol, 60 Min     ug/dL 25.9   Serum Osm 257 Urine Osm 522 Urine Na 80  Iron/TIBC/Ferritin/ %Sat    Component Value Date/Time   IRON 34 (L) 11/15/2016 2030   TIBC 363 11/15/2016 2030   FERRITIN 22 (L) 11/15/2016 2030   IRONPCTSAT 9 (L) 11/15/2016 2030    Assessment/Plan:  Principal Problem:   Hyponatremia Active Problems:   Essential hypertension   Type 2 diabetes mellitus without complication, without long-term current use of insulin (HCC)   Depression   71 y.o.  male with chronic hyponatremia likely causing generalized weakness and depression.  Clinically euvolemic and normotensive, with hypoosomolar serum and inappropriately concentrated urine consistent with SIADH.  Possible causes include medications and malignancy.  He has only mild vague symptoms which may or may not be due to his hyponatremia and are improved today without change in his Na.  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 Pt has been fluid restricted to 1200 mL per day and is now on salt tabs 1 g BID. CT chest and MRI brain were unremarkable. Na continues to stay at around 122. We are holding SSRI and PPI. WIll continue to trend BMETs every 4 hours, and possibly increase the salt tabs.    -Trend BMP -Fluid restrict  #Diarrhea: had several episodes of diarrhea yesterday and one episode this AM.  -check Cdif    #Anemia: Pt noted to have mild microcytic anemia with iron deficiency secondary to GI bleed. He received 1 dose of feraheme and will need outpatient colonoscopy.  -outpatient colonoscopy  #Depression Denies SI/HI. -Hold venlafaxine with concern for SIADH  #HTN: Normotensive. on home metoprolol  -metoprolol   #DM: CBGs well controlled  -SSI  Fluids: none; fluid restrict 1200 mL Diet: heart/carb DVT Prophylaxis: lovenox Code Status: full  Designated decision maker: son Derico Cyd [phone]); Gerilyn Nestle [phone]) if son is not available  Dispo: Anticipated discharge in approximately 1-2 day(s).   Burgess Estelle, MD 11/17/2016, 8:03 AM

## 2016-11-18 DIAGNOSIS — R197 Diarrhea, unspecified: Secondary | ICD-10-CM

## 2016-11-18 LAB — BASIC METABOLIC PANEL
ANION GAP: 9 (ref 5–15)
ANION GAP: 8 (ref 5–15)
ANION GAP: 7 (ref 5–15)
Anion gap: 11 (ref 5–15)
BUN: 10 mg/dL (ref 6–20)
BUN: 10 mg/dL (ref 6–20)
BUN: 12 mg/dL (ref 6–20)
BUN: 13 mg/dL (ref 6–20)
CALCIUM: 8.9 mg/dL (ref 8.9–10.3)
CALCIUM: 8.9 mg/dL (ref 8.9–10.3)
CHLORIDE: 94 mmol/L — AB (ref 101–111)
CO2: 25 mmol/L (ref 22–32)
CO2: 27 mmol/L (ref 22–32)
CO2: 25 mmol/L (ref 22–32)
CO2: 23 mmol/L (ref 22–32)
Calcium: 8.6 mg/dL — ABNORMAL LOW (ref 8.9–10.3)
Calcium: 8.5 mg/dL — ABNORMAL LOW (ref 8.9–10.3)
Chloride: 90 mmol/L — ABNORMAL LOW (ref 101–111)
Chloride: 90 mmol/L — ABNORMAL LOW (ref 101–111)
Chloride: 94 mmol/L — ABNORMAL LOW (ref 101–111)
Creatinine, Ser: 0.76 mg/dL (ref 0.61–1.24)
Creatinine, Ser: 0.79 mg/dL (ref 0.61–1.24)
Creatinine, Ser: 1.04 mg/dL (ref 0.61–1.24)
Creatinine, Ser: 0.93 mg/dL (ref 0.61–1.24)
GFR calc Af Amer: >60 mL/min (ref >60)
GFR calc non Af Amer: >60 mL/min (ref >60)
GFR calc non Af Amer: >60 mL/min (ref >60)
GLUCOSE: 136 mg/dL — AB (ref 65–99)
Glucose, Bld: 79 mg/dL (ref 65–99)
Glucose, Bld: 106 mg/dL — ABNORMAL HIGH (ref 65–99)
Glucose, Bld: 203 mg/dL — ABNORMAL HIGH (ref 65–99)
POTASSIUM: 4.2 mmol/L (ref 3.5–5.1)
POTASSIUM: 3.9 mmol/L (ref 3.5–5.1)
Potassium: 3.9 mmol/L (ref 3.5–5.1)
Potassium: 4 mmol/L (ref 3.5–5.1)
SODIUM: 125 mmol/L — AB (ref 135–145)
SODIUM: 128 mmol/L — AB (ref 135–145)
Sodium: 124 mmol/L — ABNORMAL LOW (ref 135–145)
Sodium: 126 mmol/L — ABNORMAL LOW (ref 135–145)

## 2016-11-18 LAB — GLUCOSE, CAPILLARY
GLUCOSE-CAPILLARY: 137 mg/dL — AB (ref 65–99)
Glucose-Capillary: 103 mg/dL — ABNORMAL HIGH (ref 65–99)
Glucose-Capillary: 120 mg/dL — ABNORMAL HIGH (ref 65–99)
Glucose-Capillary: 177 mg/dL — ABNORMAL HIGH (ref 65–99)

## 2016-11-18 MED ORDER — GI COCKTAIL ~~LOC~~
30.0000 mL | Freq: Once | ORAL | Status: AC
  Administered 2016-11-18: 30 mL via ORAL
  Filled 2016-11-18: qty 30

## 2016-11-18 NOTE — Progress Notes (Signed)
   Subjective:  No acute events overnight. Feels well.  Denies nausea or vomiting  Continued to have some diarrhea- 3 episodes. C Dif was negative.    Objective:  Vital signs in last 24 hours: Vitals:   11/17/16 0549 11/17/16 2235 11/18/16 0525 11/18/16 0824  BP: 111/63 (!) 111/45 130/64 130/60  Pulse: 71 83 72 76  Resp: 17 20    Temp: 98.6 F (37 C) 98.1 F (36.7 C) 97.4 F (36.3 C)   TempSrc: Oral Oral Oral   SpO2: 97% 98% 97%    Physical Exam  Constitutional: He is oriented to person, place, and time. He appears well-developed and well-nourished. No distress.  Cardiovascular: Normal rate and regular rhythm.   Pulmonary/Chest: Effort normal and breath sounds normal.  Neurological: He is alert and oriented to person, place, and time.  Psychiatric: He has a normal mood and affect. His behavior is normal.   BMP Latest Ref Rng & Units 11/18/2016 11/18/2016 11/17/2016  Glucose 65 - 99 mg/dL 106(H) 79 108(H)  BUN 6 - 20 mg/dL 10 10 12   Creatinine 0.61 - 1.24 mg/dL 0.79 0.76 0.84  BUN/Creat Ratio 10 - 24 - - -  Sodium 135 - 145 mmol/L 125(L) 124(L) 123(L)  Potassium 3.5 - 5.1 mmol/L 4.0 3.9 4.1  Chloride 101 - 111 mmol/L 90(L) 90(L) 91(L)  CO2 22 - 32 mmol/L 27 25 25   Calcium 8.9 - 10.3 mg/dL 8.9 8.9 8.8(L)   Component     Latest Ref Rng & Units 11/16/2016  Cortisol, Base     ug/dL 11.3  Cortisol, 30 Min     ug/dL 22.2  Cortisol, 60 Min     ug/dL 25.9   Serum Osm 257 Urine Osm 522 Urine Na 80  Iron/TIBC/Ferritin/ %Sat    Component Value Date/Time   IRON 34 (L) 11/15/2016 2030   TIBC 363 11/15/2016 2030   FERRITIN 22 (L) 11/15/2016 2030   IRONPCTSAT 9 (L) 11/15/2016 2030    Assessment/Plan:  Principal Problem:   Hyponatremia Active Problems:   Essential hypertension   Type 2 diabetes mellitus without complication, without long-term current use of insulin (HCC)   Depression   71 y.o. male with chronic hyponatremia likely causing generalized weakness and  depression, likely SIADH. Sodium slowly improving with fluid restriction and salt tabs.   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 Pt has been fluid restricted to 800 mL per day and is now on salt tabs 2 g BID. CT chest and MRI brain were unremarkable. Na has improved to 125 today. Target correction is 128-130 over next 24 hours. We are holding SSRI and PPI. WIll continue to trend BMETs every 8 hours  -Trend BMP -Fluid restrict -salt tabs  #Diarrhea: had several episodes of diarrhea yesterday and today. C Dif was negative.   -stopped scheduled senokot   #Anemia: Pt noted to have mild microcytic anemia with iron deficiency secondary to GI bleed. He received 1 dose of feraheme and will need outpatient colonoscopy.  -outpatient colonoscopy  #Depression Denies SI/HI. -Hold venlafaxine with concern for SIADH  #HTN: Normotensive. on home metoprolol  -metoprolol   #DM: CBGs well controlled  -SSI  Fluids: none; fluid restrict 800 mL Diet: heart/carb DVT Prophylaxis: lovenox Code Status: full Designated decision maker: son Linken Cyd [phone]); Gerilyn Nestle [phone]) if son is not available  Dispo: Anticipated discharge in approximately 1 day(s).   Burgess Estelle, MD 11/18/2016, 10:06 AM

## 2016-11-18 NOTE — Progress Notes (Signed)
Internal Medicine Attending:   I saw and examined the patient. I reviewed the resident's note and I agree with the resident's findings and plan as documented in the resident's note.  Patient feels well today but complains of persistent diarrhea. C. difficile was tested yesterday and was negative. Patient was noted to be on senna on his medication list. This is discontinued today. We'll continue to monitor him off this medication.  Patient was initially admitted for symptomatic hyponatremia. He states his fatigue is much improved and that he feels well other than the diarrhea. His hyponatremia is likely secondary to SIADH. His sodium is improved mildly since admission up to 125. Continue with by mouth fluid restriction and salt tabs. If sodium continues to improve patient should likely be discharged home tomorrow.

## 2016-11-19 ENCOUNTER — Telehealth: Payer: Self-pay

## 2016-11-19 DIAGNOSIS — Z79899 Other long term (current) drug therapy: Secondary | ICD-10-CM

## 2016-11-19 DIAGNOSIS — Z7982 Long term (current) use of aspirin: Secondary | ICD-10-CM

## 2016-11-19 DIAGNOSIS — F329 Major depressive disorder, single episode, unspecified: Secondary | ICD-10-CM

## 2016-11-19 DIAGNOSIS — Z7984 Long term (current) use of oral hypoglycemic drugs: Secondary | ICD-10-CM

## 2016-11-19 DIAGNOSIS — E222 Syndrome of inappropriate secretion of antidiuretic hormone: Principal | ICD-10-CM

## 2016-11-19 DIAGNOSIS — E119 Type 2 diabetes mellitus without complications: Secondary | ICD-10-CM

## 2016-11-19 DIAGNOSIS — I10 Essential (primary) hypertension: Secondary | ICD-10-CM

## 2016-11-19 LAB — GLUCOSE, CAPILLARY
Glucose-Capillary: 170 mg/dL — ABNORMAL HIGH (ref 65–99)
Glucose-Capillary: 73 mg/dL (ref 65–99)

## 2016-11-19 LAB — BASIC METABOLIC PANEL
Anion gap: 10 (ref 5–15)
BUN: 11 mg/dL (ref 6–20)
CALCIUM: 8.6 mg/dL — AB (ref 8.9–10.3)
CO2: 23 mmol/L (ref 22–32)
Chloride: 96 mmol/L — ABNORMAL LOW (ref 101–111)
Creatinine, Ser: 0.79 mg/dL (ref 0.61–1.24)
GFR calc Af Amer: >60 mL/min (ref >60)
GLUCOSE: 137 mg/dL — AB (ref 65–99)
Potassium: 3.6 mmol/L (ref 3.5–5.1)
Sodium: 129 mmol/L — ABNORMAL LOW (ref 135–145)

## 2016-11-19 LAB — PSA, TOTAL AND FREE
PSA FREE PCT: 20.9 %
PSA FREE: 0.23 ng/mL
Prostate Specific Ag, Serum: 1.1 ng/mL (ref 0.0–4.0)

## 2016-11-19 MED ORDER — SENNOSIDES-DOCUSATE SODIUM 8.6-50 MG PO TABS: 1.0000 | ORAL_TABLET | Freq: Every day | ORAL | Status: DC | PRN

## 2016-11-19 MED ORDER — SODIUM CHLORIDE 1 G PO TABS: 2.0000 g | ORAL_TABLET | Freq: Two times a day (BID) | ORAL | 0 refills | Status: DC

## 2016-11-19 NOTE — Discharge Summary (Signed)
Name: [name] MRN: [id number] DOB: 08-Sep-1945 71 y.o. PCP: Jason Raring, MD  Date of Admission: 11/15/2016  5:51 PM Date of Discharge: 11/19/2016 Attending Physician: Jason Filler, MD  Discharge Diagnosis:  Principal Problem:   SIADH (syndrome of inappropriate ADH production) ([place]) Active Problems:   Essential hypertension   Type 2 diabetes mellitus without complication, without long-term current use of insulin ([place])   Depression   Discharge Medications: Allergies as of 11/19/2016   No Known Allergies     Medication List    STOP taking these medications   gabapentin 300 MG capsule Commonly known as:  NEURONTIN   omeprazole 20 MG capsule Commonly known as:  PRILOSEC   venlafaxine XR 37.5 MG 24 hr capsule Commonly known as:  EFFEXOR XR     TAKE these medications   aspirin 81 MG chewable tablet Chew 81 mg by mouth daily.   atorvastatin 80 MG tablet Commonly known as:  LIPITOR Take 80 mg by mouth daily.   docusate sodium 100 MG capsule Commonly known as:  COLACE Take 1 capsule (100 mg total) by mouth 2 (two) times daily.   ferrous sulfate 325 (65 FE) MG EC tablet Take 1 tablet (325 mg total) by mouth 2 (two) times daily.   lisinopril 20 MG tablet Commonly known as:  PRINIVIL,ZESTRIL Take 1 tablet (20 mg total) by mouth daily.   metFORMIN 500 MG tablet Commonly known as:  GLUCOPHAGE Take 500 mg by mouth 2 (two) times daily with a meal.   metoprolol succinate 50 MG 24 hr tablet Commonly known as:  TOPROL-XL Take 50 mg by mouth daily.   nitroGLYCERIN 0.4 MG SL tablet Commonly known as:  NITROSTAT Place 1 tablet (0.4 mg total) under the tongue daily. What changed:  when to take this  reasons to take this   pregabalin 25 MG capsule Commonly known as:  LYRICA Take 1 capsule (25 mg total) by mouth 3 (three) times daily.   sodium chloride 1 g tablet Take 2 tablets (2 g total) by mouth 2 (two) times daily with a meal.   tamsulosin  0.4 MG Caps capsule Commonly known as:  FLOMAX Take 1 capsule (0.4 mg total) by mouth daily after breakfast.       Disposition and follow-up:   [name] was discharged from [facility] in Good condition.  At the hospital follow up visit please address:  1.  Hyponatremia.  Check BMP.  Ask about generalized weakness, fatigue, concentrations, falls.  Reinforce need for fluid restriction.  2.  GI Bleeding.  Ask about melena and hematochezzia.  No active bleeding, ensure he follows up with GI.  3.  Iron Deficiency Anemia.  Likely secondary to chronic GI bleeding.  Check CBC.  Manage oral iron supplementation and bowel regimen.  4.  Depression.  Concern for social isolation and depression, thought during admission he endorses social stressors but denies depression.  Avoid SSRIs/SNRIs due to risk of worsening SIADH.  Consider mirtazapine or bupropion if pharmacotherapy indicated.  5.  Labs / imaging needed at time of follow-up: BMP, CBC  6.  Pending labs/ test needing follow-up: PSA  Follow-up Appointments: Follow-up Information    [place]. Go in 7 day(s).   Why:  at 3:15pm Contact information: [address] [facility] Course by problem list: Principal Problem:   SIADH (syndrome of inappropriate ADH production) ([place])  Active Problems:   Essential hypertension   Type 2 diabetes mellitus without complication, without long-term current use of insulin (HCC)   Depression   1. Hyponatremia [name] was admitted from clinic for subacute hyponatremia for about 2 months with Na of 122 and vague symptoms of generalized weakness, depression, and anorexia.  He was admitted, fluid restricted, and Na closely monitored.  TSH and cortisol/ACTH stimulation testing were normal.  He was clinically euvolemic with hypoosmolar serum and inappropriately concentrated urine, consistent with  SIADH.  His PPI was discontinued, since there are case reports of omeprazole causing SIADH, and the venlafaxine which was prescribed the day before was discontinued.  With fluid restricted and oral NaCl supplementation his Na and symptoms improved.  He was discharged with instructions to limit fluid intake to 1 L daily, discontinue SNRI and PPI, and take salt tabs.  2. GI Bleeding 3. Anemia Reports melena and an episode of stool streaked with red blood last week and found to have iron deficiency anemia.  Hgb stable and no clinical bleeding during his admission and he was given an IV iron infusion.  He complained of constipation and was started on a bowel regimen which resulted in diarrhea.  With his oral iron supplementation, he may need a regular bowel regimen.  He reports that he already has an appointment with [facility] gastroenterologist on April 10 and will need to follow-up for possible diagnostic colonoscopy.  4. Depression Reports social isolation and depression, and clinic notes and collateral from his neighbor cite concern for SI.  He denied SI/HI and by the day after admission reported that his mood was much improved.  His sister recently died from cancer, and that social stressor has made him feel worse recently.  SNRI was held with concern for worsening his SIADH, and no new antidepressant was started.  Could consider mirtazapine or bupropion at follow-up if pharmacotherapy for depression is warranted.  Discharge Vitals:   BP (!) 124/59 (BP Location: Right Arm)   Pulse 71   Temp 98.3 F (36.8 C) (Oral)   Resp 18   SpO2 100%   Pertinent Labs, Studies, and Procedures:   BMP Latest Ref Rng & Units 11/19/2016 11/18/2016 11/18/2016  Glucose 65 - 99 mg/dL 137(H) 203(H) 136(H)  BUN 6 - 20 mg/dL 11 13 12   Creatinine 0.61 - 1.24 mg/dL 0.79 0.93 1.04  BUN/Creat Ratio 10 - 24 - - -  Sodium 135 - 145 mmol/L 129(L) 128(L) 126(L)  Potassium 3.5 - 5.1 mmol/L 3.6 3.9 4.2  Chloride 101 - 111 mmol/L  96(L) 94(L) 94(L)  CO2 22 - 32 mmol/L 23 23 25   Calcium 8.9 - 10.3 mg/dL 8.6(L) 8.5(L) 8.6(L)   Serum Osm 257 Urine Osm 522 Urine Sodium 80  Component     Latest Ref Rng & Units 11/16/2016  Cortisol, Base     ug/dL 11.3  Cortisol, 30 Min     ug/dL 22.2  Cortisol, 60 Min     ug/dL 25.9   Iron/TIBC/Ferritin/ %Sat    Component Value Date/Time   IRON 34 (L) 11/15/2016 2030   TIBC 363 11/15/2016 2030   FERRITIN 22 (L) 11/15/2016 2030   IRONPCTSAT 9 (L) 11/15/2016 2030   CT Chest w/o Contrast 11/16/2016 IMPRESSION: Aortic atherosclerosis and minimal coronary arterial calcification.  Questionable diffuse thickening of the esophageal wall; this could reflect esophagitis or reflux.  No acute pulmonary abnormalities.  LEFT renal cyst.  MRI Brain 11/16/2016 IMPRESSION: Extensive intravascular susceptibility artifact most compatible  with recent iron transfusion, which negates postcontrast imaging for up to 3 months.  No acute intracranial process. Moderate to severe white matter changes compatible with chronic small vessel ischemic disease.  Discharge Instructions: Discharge Instructions    Diet - low sodium heart healthy    Complete by:  As directed    Increase activity slowly    Complete by:  As directed     You were admitted to the hospital because of low sodium in your blood.  It is important that you not drink more than 1L of water per day in order to keep the the sodium from getting too low.  I have also prescribed you salt tablets to take twice a day.  Please stop taking Omeprazole and Venlafaxine, as these can cause your sodium to go low.  We'll see you in clinic next week.  Signed: Minus Liberty, MD 11/19/2016, 10:36 AM   Pager: [phone]

## 2016-11-19 NOTE — Progress Notes (Signed)
Subjective: Yesterday morning he was feeling slightly unwell. Then when he tried to eat some jello at lunch, he had sudden onset of intense RUQ pain which radiated to his back and shoulder and he wasn't able to eat any more and skipped dinner.  This morning he denies pain and nausea and feels ready to go home.  Objective:  Vital signs in last 24 hours: Vitals:   11/18/16 0824 11/18/16 1640 11/18/16 2136 11/19/16 0542  BP: 130/60 (!) 124/59 129/63 (!) 124/59  Pulse: 76 76 71 71  Resp:  20 18   Temp:  99.3 F (37.4 C)  98.3 F (36.8 C)  TempSrc:  Oral  Oral  SpO2:  100% 97% 100%   Physical Exam  Constitutional: He is oriented to person, place, and time. He appears well-developed and well-nourished. No distress.  Cardiovascular: Normal rate and regular rhythm.   Pulmonary/Chest: Effort normal and breath sounds normal.  Abdominal: He exhibits no distension and no mass.  Mild tenderness to palpation RUQ > RLQ Hyperactive bowel sounds  Neurological: He is alert and oriented to person, place, and time.  Psychiatric: He has a normal mood and affect. His behavior is normal.   BMP Latest Ref Rng & Units 11/19/2016 11/18/2016 11/18/2016  Glucose 65 - 99 mg/dL 137(H) 203(H) 136(H)  BUN 6 - 20 mg/dL 11 13 12   Creatinine 0.61 - 1.24 mg/dL 0.79 0.93 1.04  BUN/Creat Ratio 10 - 24 - - -  Sodium 135 - 145 mmol/L 129(L) 128(L) 126(L)  Potassium 3.5 - 5.1 mmol/L 3.6 3.9 4.2  Chloride 101 - 111 mmol/L 96(L) 94(L) 94(L)  CO2 22 - 32 mmol/L 23 23 25   Calcium 8.9 - 10.3 mg/dL 8.6(L) 8.5(L) 8.6(L)   Assessment/Plan:  Principal Problem:   SIADH (syndrome of inappropriate ADH production) (HCC) Active Problems:   Essential hypertension   Type 2 diabetes mellitus without complication, without long-term current use of insulin (HCC)   Depression   71 y.o. male with chronic hyponatremia likely causing generalized weakness and depression.  Clinically euvolemic and normotensive, with hypoosomolar serum  and inappropriately concentrated urine consistent with SIADH.  Secondary causes of lung malignancy and hypothalamaic-pituitary pathology investigated and excluded.  His sodium and symptoms have now improved and he is ready for discharge.  #Chronic Hypoosmolar Hyponatremia Improved.  Inappropriately concentrated urine early in the month suggestive of ADH-dependent mechanism and SIADH.  SNRIs are a common culprit but he just started venlafaxine yesterday.  Mild hyperkalemia would consistent with AI, but he is hypertensive.  Normal TSH, hypothyroidism unlikely.  He may have poor solute intake relate to free water consumption, but studies early in the month were more indicative of SIADH.  Will hold possibly offending meds (SNRI, PPI) and order chest CT to evaluate for malignancy in former smoker.  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 -Fluid restrict 800 mL/day -2 g NaCl PO BID -Hold SNRI, PPI  #Anemia #GI Bleeding Mild microcytic anemia, iron deficiency due to possible GI bleeding.  Received IV iron once. -PO iron supplementation -Outpatient GI follow-up  #Depression Mood has improved, denies depression, SI/HI. -Hold venlafaxine with concern for SIADH -Consider bupropion or mirtazapine for pharmacotherapy for depression if needed  #HTN Normotensive to mildly hypotensive. -Resume home metoprolol -Hold home lisinopril   #DM -Hold home metformin -SSI  Fluids: none; fluid restrict  800 mL Diet: heart/carb DVT Prophylaxis: lovenox Code Status: full Designated decision maker: son Asani Cyd [phone]); Gerilyn Nestle [phone]) if son is not available  Dispo: Anticipated discharge today.   Minus Liberty, MD 11/19/2016, 7:27 AM Pager:  [phone]

## 2016-11-19 NOTE — Telephone Encounter (Signed)
Hospital TOC per Dr Inda Castle, discharge 11/19/2016, appt 11/26/2016.

## 2016-11-19 NOTE — Progress Notes (Signed)
NURSING PROGRESS NOTE  [name] [id number] Discharge Data: 11/19/2016 2:35 PM Attending Provider: [name], MD GLO:VFIEP Mitzie Na, MD     Selinda Michaels to be D/C'd Home per MD order.  Discussed with the patient the After Visit Summary and all questions fully answered. All IV's discontinued with no bleeding noted. All belongings returned to patient for patient to take home. Used an interpreter to review AVS.   Last Vital Signs:  Blood pressure (!) 124/59, pulse 71, temperature 98.3 F (36.8 C), temperature source Oral, resp. rate 18, SpO2 100 %.  Discharge Medication List Allergies as of 11/19/2016   No Known Allergies     Medication List    STOP taking these medications   gabapentin 300 MG capsule Commonly known as:  NEURONTIN   omeprazole 20 MG capsule Commonly known as:  PRILOSEC   venlafaxine XR 37.5 MG 24 hr capsule Commonly known as:  EFFEXOR XR     TAKE these medications   aspirin 81 MG chewable tablet Chew 81 mg by mouth daily.   atorvastatin 80 MG tablet Commonly known as:  LIPITOR Take 80 mg by mouth daily.   docusate sodium 100 MG capsule Commonly known as:  COLACE Take 1 capsule (100 mg total) by mouth 2 (two) times daily.   ferrous sulfate 325 (65 FE) MG EC tablet Take 1 tablet (325 mg total) by mouth 2 (two) times daily.   lisinopril 20 MG tablet Commonly known as:  PRINIVIL,ZESTRIL Take 1 tablet (20 mg total) by mouth daily.   metFORMIN 500 MG tablet Commonly known as:  GLUCOPHAGE Take 500 mg by mouth 2 (two) times daily with a meal.   metoprolol succinate 50 MG 24 hr tablet Commonly known as:  TOPROL-XL Take 50 mg by mouth daily.   nitroGLYCERIN 0.4 MG SL tablet Commonly known as:  NITROSTAT Place 1 tablet (0.4 mg total) under the tongue daily. What changed:  when to take this  reasons to take this   pregabalin 25 MG capsule Commonly known as:  LYRICA Take 1 capsule (25 mg total) by mouth 3 (three) times  daily.   sodium chloride 1 g tablet Take 2 tablets (2 g total) by mouth 2 (two) times daily with a meal.   tamsulosin 0.4 MG Caps capsule Commonly known as:  FLOMAX Take 1 capsule (0.4 mg total) by mouth daily after breakfast.

## 2016-11-19 NOTE — Discharge Instructions (Addendum)
You were admitted to the hospital because of low sodium in your blood.  It is important that you not drink more than 1L of water per day in order to keep the the sodium from getting too low.  I have also prescribed you salt tablets to take twice a day.  Please stop taking Omeprazole and Venlafaxine, as these can cause your sodium to go low.  We'll see you in clinic next week.  Hiponatremia (Hyponatremia) La hiponatremia se produce cuando la cantidad de sal (sodio) en la sangre es muy baja. Cuando las [place] de sodio son [place], las clulas absorben agua en exceso y se hinchan. La hinchazn se produce en todo el cuerpo, pero afecta principalmente el cerebro. CAUSAS Este trastorno puede ser causado por lo siguiente:  Problemas cardacos, renales o hepticos.  Problemas de tiroides.  Problemas en las glndulas suprarrenales.  Enfermedades metablicas, como sndrome de secrecin inadecuada de la hormona antidiurtica (SIADH).  Vmitos y diarrea intensos.  Ciertos medicamentos o drogas.  Deshidratacin.  Beber demasiada agua.  Consumir una dieta con bajo contenido de [place].  Quemaduras extensas en el cuerpo.  Sudoracin. FACTORES DE RIESGO Es ms probable que esta afeccin se manifieste en las personas que:  Tiene una enfermedad renal prolongada (crnica).  Tienen insuficiencia cardaca.  Tienen una enfermedad que causa diarrea frecuente o excesiva.  Tienen enfermedades metablicas, como enfermedad de [place] o SIADH.  Toman determinados medicamentos que afectan el equilibrio de sodio y de lquidos en la sangre. Algunos de estos tipos de medicamentos son los siguientes:  Diurticos.  Antiinflamatorios no esteroides.  Algunos analgsicos opioides.  Algunos antidepresivos.  Algunos anticonvulsivos. SNTOMAS Los sntomas de esta afeccin incluyen lo siguiente:  Nuseas y vmitos.  Confusin.  Eleonore Chiquito.  Agitacin.  Dolor de  [place].  Convulsiones.  Prdida del conocimiento.  Prdida del apetito.  Debilidad y calambres musculares.  Sensacin de debilidad o de desvanecimiento.  Frecuencia cardaca acelerada.  Desmayos, cuando los casos son graves. DIAGNSTICO Esta afeccin se diagnostica mediante la historia clnica y un examen fsico. Tambin le harn otros estudios, por ejemplo:  Anlisis de [place].  Anlisis de [place]. TRATAMIENTO El tratamiento de esta afeccin depende de la causa. El tratamiento puede incluir lo siguiente:  Lquidos que se administran por una va intravenosa (IV) que se coloca en una de las venas.  Medicamentos para corregir el desequilibrio de sodio. Si el trastorno es causado por medicamentos, ser [employer] las dosis.  Limitar el consumo de agua o lquido para Armed forces operational officer equilibrio correcto de sodio. [place] los medicamentos solamente como se lo haya indicado el mdico. Muchos medicamentos pueden agravar este trastorno. Hable con el mdico sobre los medicamentos que est tomando actualmente.  Siga rigurosamente la dieta recomendada como se lo haya indicado el mdico.  Siga cuidadosamente las indicaciones del mdico respecto de las restricciones para los lquidos.  Concurra a todas las visitas de control como se lo haya indicado el mdico. Esto es importante.  No beba alcohol. SOLICITE ATENCIN MDICA SI:  Las nuseas, la fatiga, el dolor de [place], la confusin o la debilidad [place].  Los sntomas desaparecen y Teacher, adult education.  Tiene problemas para seguir la dieta recomendada. SOLICITE ATENCIN MDICA DE INMEDIATO SI:  Tiene convulsiones.  Se desmaya.  Tiene diarrea o vmitos de forma continua. Esta informacin no tiene Marine scientist el consejo del mdico. Asegrese de hacerle al mdico cualquier pregunta que tenga. Document Released: 08/06/2005 Document Revised:  12/21/2014 Document Reviewed:  08/26/2014 Elsevier Interactive Patient Education  2017 [employer].

## 2016-11-19 NOTE — Care Management Important Message (Signed)
Important Message  Patient Details  Name: [name] MRN: [id number] Date of Birth: 1945/09/16   Medicare Important Message Given:  Yes    [name] Montine Circle 11/19/2016, 4:49 PM

## 2016-11-26 ENCOUNTER — Ambulatory Visit (INDEPENDENT_AMBULATORY_CARE_PROVIDER_SITE_OTHER): Payer: Medicare Other | Admitting: Internal Medicine

## 2016-11-26 ENCOUNTER — Encounter: Payer: Self-pay | Admitting: Internal Medicine

## 2016-11-26 VITALS — BP 157/91 | HR 96 | Temp 98.1°F | Ht 65.0 in | Wt 155.5 lb

## 2016-11-26 DIAGNOSIS — Z8719 Personal history of other diseases of the digestive system: Secondary | ICD-10-CM

## 2016-11-26 DIAGNOSIS — E222 Syndrome of inappropriate secretion of antidiuretic hormone: Secondary | ICD-10-CM

## 2016-11-26 DIAGNOSIS — D509 Iron deficiency anemia, unspecified: Secondary | ICD-10-CM

## 2016-11-26 DIAGNOSIS — K922 Gastrointestinal hemorrhage, unspecified: Secondary | ICD-10-CM

## 2016-11-26 DIAGNOSIS — F325 Major depressive disorder, single episode, in full remission: Secondary | ICD-10-CM

## 2016-11-26 MED ORDER — SENNA-DOCUSATE SODIUM 8.6-50 MG PO TABS: 2.0000 | ORAL_TABLET | Freq: Every day | ORAL | 2 refills | Status: DC

## 2016-11-26 NOTE — Assessment & Plan Note (Signed)
Patient reports no additional episodes of hematochezia or melena since hospital discharge. He was scheduled to undergo colonoscopy tomorrow with the GI office in [facility]. However, patient reports he is unable to make his appointment because he does not have transportation. He was given the GI office address and phone number and advised to contact them as soon as possible to reschedule his colonoscopy. He was also advised to return to clinic if he has any additional episodes of hematochezia or melena. Will check a CBC today.
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
## 2016-11-26 NOTE — Assessment & Plan Note (Signed)
His symptoms of generalized weakness and fatigue seem to have improved with continued fluid restriction. His chest imaging and brain imaging were negative for any malignancy. His PSA was negative. Will check a bmet today to evaluate his sodium level. He was advised to continue fluid restriction.

## 2016-11-26 NOTE — Assessment & Plan Note (Signed)
No additional episodes of GI bleeding. Patient advised to continue oral iron therapy. Prescribed him Senokot 2 tablets at bedtime to help with his constipation related to the iron therapy. Will check a CBC today to evaluate his hemoglobin.

## 2016-11-26 NOTE — Patient Instructions (Signed)
General Instructions: - Reschedule your colonoscopy for next week - Start Senokot 2 tablets at bedtime for constipation - Continue your iron therapy - Remember to restrict your fluids   Please bring your medicines with you each time you come to clinic.  Medicines may include prescription medications, over-the-counter medications, herbal remedies, eye drops, vitamins, or other pills.   Progress Toward Treatment Goals:  No flowsheet data found.  Self Care Goals & Plans:  No flowsheet data found.  No flowsheet data found.   Care Management & Community Referrals:  No flowsheet data found.

## 2016-11-26 NOTE — Assessment & Plan Note (Signed)
Patient does not have any signs for depression today. His PHQ-2 score was 0. Will continue to monitor.

## 2016-11-27 LAB — CBC
Hematocrit: 40.3 % (ref 37.5–51.0)
Hemoglobin: 13.9 g/dL (ref 13.0–17.7)
MCH: 28.3 pg (ref 26.6–33.0)
MCHC: 34.5 g/dL (ref 31.5–35.7)
MCV: 82 fL (ref 79–97)
PLATELETS: 286 10*3/uL (ref 150–379)
RBC: 4.92 x10E6/uL (ref 4.14–5.80)
RDW: 17.6 % — AB (ref 12.3–15.4)
WBC: 6.4 10*3/uL (ref 3.4–10.8)

## 2016-11-27 LAB — BMP8+ANION GAP
ANION GAP: 15 mmol/L (ref 10.0–18.0)
BUN / CREAT RATIO: 12 (ref 10–24)
BUN: 9 mg/dL (ref 8–27)
CO2: 24 mmol/L (ref 18–29)
CREATININE: 0.73 mg/dL — AB (ref 0.76–1.27)
Calcium: 9.5 mg/dL (ref 8.6–10.2)
Chloride: 83 mmol/L — ABNORMAL LOW (ref 96–106)
GFR calc non Af Amer: 93 mL/min/{1.73_m2} (ref >59)
GFR, EST AFRICAN AMERICAN: 107 mL/min/{1.73_m2} (ref >59)
Glucose: 124 mg/dL — ABNORMAL HIGH (ref 65–99)
POTASSIUM: 5.1 mmol/L (ref 3.5–5.2)
SODIUM: 122 mmol/L — AB (ref 134–144)

## 2016-11-27 NOTE — Telephone Encounter (Signed)
Discussed low sodium result with patient's daughter in law, Verdis Frederickson. She will communicate message to him to fluid restrict. He will be seen in [facility] on Friday 4/13 at 9:45 AM.

## 2016-11-27 NOTE — Progress Notes (Signed)
Case discussed with Dr. Rivet at the time of the visit.  We reviewed the resident's history and exam and pertinent patient test results.  I agree with the assessment, diagnosis and plan of care documented in the resident's note. 

## 2016-11-30 ENCOUNTER — Ambulatory Visit (INDEPENDENT_AMBULATORY_CARE_PROVIDER_SITE_OTHER): Payer: Medicare Other | Admitting: Internal Medicine

## 2016-11-30 ENCOUNTER — Encounter: Payer: Self-pay | Admitting: Internal Medicine

## 2016-11-30 VITALS — BP 168/77 | HR 87 | Temp 98.3°F | Ht 65.0 in | Wt 155.2 lb

## 2016-11-30 DIAGNOSIS — E222 Syndrome of inappropriate secretion of antidiuretic hormone: Secondary | ICD-10-CM | POA: Diagnosis not present

## 2016-11-30 DIAGNOSIS — Z87891 Personal history of nicotine dependence: Secondary | ICD-10-CM

## 2016-11-30 DIAGNOSIS — I10 Essential (primary) hypertension: Secondary | ICD-10-CM | POA: Diagnosis not present

## 2016-11-30 DIAGNOSIS — K922 Gastrointestinal hemorrhage, unspecified: Secondary | ICD-10-CM

## 2016-11-30 DIAGNOSIS — Z79899 Other long term (current) drug therapy: Secondary | ICD-10-CM | POA: Diagnosis not present
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
## 2016-11-30 NOTE — Patient Instructions (Signed)
General Instructions: - Eat salty foods such as potato chips, salted nuts, soup - Continue fluid restricting  - Follow up in 2 weeks to see how you are feeling. Return to clinic sooner if needed.  Please bring your medicines with you each time you come to clinic.  Medicines may include prescription medications, over-the-counter medications, herbal remedies, eye drops, vitamins, or other pills.   Progress Toward Treatment Goals:  No flowsheet data found.  Self Care Goals & Plans:  No flowsheet data found.  No flowsheet data found.   Care Management & Community Referrals:  No flowsheet data found.

## 2016-11-30 NOTE — Assessment & Plan Note (Signed)
Patient has colonoscopy scheduled on 4/26. Will follow up this report. He denies any current bleeding and hemoglobin was stable at his last visit this week.

## 2016-11-30 NOTE — Assessment & Plan Note (Signed)
Blood pressure was elevated today. Patient reports compliance with his medications. His increased blood pressure could be due to acute illness and not feeling well. Will continue to monitor this closely. Once his hyponatremia is stable, would consider increasing his lisinopril to 40 mg daily.

## 2016-11-30 NOTE — Assessment & Plan Note (Signed)
He has hyponatremia secondary to SIADH that was presumed to be medication-related. Based on his report he has been fluid restricting but is still hyponatremic. He has lost some weight since hospitalization. He weighed 162 pounds on hospital discharge and his weight the last 2 visits has been stable at 155 pounds. Patient does report decreased appetite and eating very little. This is likely contributing to his hyponatremia as he is having decreased solute intake. Some malignancy workup was performed in the hospital, including a CT chest, MRI brain, and PSA which were all negative. He is complaining of loose stools but these are only occurring once every 2 days. Hopefully he will get his colonoscopy soon and this will rule out any GI malignancy. He was advised to eat high salt foods, such as potato chips, salted nuts, and canned soup. Will check a repeat bmet today. He was advised to follow-up in 2 weeks to see how he is feeling and to return to clinic sooner if his symptoms worsen.

## 2016-12-01 LAB — BMP8+ANION GAP
ANION GAP: 13 mmol/L (ref 10.0–18.0)
BUN/Creatinine Ratio: 13 (ref 10–24)
BUN: 12 mg/dL (ref 8–27)
CO2: 27 mmol/L (ref 18–29)
Calcium: 9.7 mg/dL (ref 8.6–10.2)
Chloride: 85 mmol/L — ABNORMAL LOW (ref 96–106)
Creatinine, Ser: 0.89 mg/dL (ref 0.76–1.27)
GFR calc Af Amer: 99 mL/min/{1.73_m2} (ref >59)
GFR, EST NON AFRICAN AMERICAN: 85 mL/min/{1.73_m2} (ref >59)
Glucose: 112 mg/dL — ABNORMAL HIGH (ref 65–99)
POTASSIUM: 5 mmol/L (ref 3.5–5.2)
SODIUM: 125 mmol/L — AB (ref 134–144)

## 2016-12-04 ENCOUNTER — Telehealth: Payer: Self-pay | Admitting: Pharmacist

## 2016-12-04 NOTE — Progress Notes (Signed)
Spoke to pharmacist at [facility]. Sodium chloride tablets $150 due to not being covered by insurance. Patient may be able to purchase OTC for lower cost. Called patient to notify him but unable to reach.

## 2016-12-04 NOTE — Progress Notes (Signed)
Internal Medicine Clinic Attending  Case discussed with Dr. Rivet at the time of the visit.  We reviewed the resident's history and exam and pertinent patient test results.  I agree with the assessment, diagnosis, and plan of care documented in the resident's note.  

## 2016-12-06 NOTE — Progress Notes (Signed)
Spoke to relative through interpreter to notify patient of findings of sodium chloride prescription.

## 2016-12-13 ENCOUNTER — Telehealth: Payer: Self-pay | Admitting: Internal Medicine

## 2016-12-13 ENCOUNTER — Encounter: Payer: Medicare Other | Admitting: Internal Medicine

## 2016-12-13 NOTE — Telephone Encounter (Signed)
APT. REMINDER CALL, NO ANSWER, NO VOICEMAIL °

## 2016-12-14 ENCOUNTER — Ambulatory Visit (INDEPENDENT_AMBULATORY_CARE_PROVIDER_SITE_OTHER): Payer: Medicare Other | Admitting: Internal Medicine

## 2016-12-14 ENCOUNTER — Inpatient Hospital Stay (HOSPITAL_COMMUNITY)
Admission: EM | Admit: 2016-12-14 | Discharge: 2016-12-25 | DRG: 082 | Disposition: A | Discharge status: Home Health | Payer: Medicare Other | Attending: Student in an Organized Health Care Education/Training Program | Admitting: Student in an Organized Health Care Education/Training Program

## 2016-12-14 ENCOUNTER — Encounter: Payer: Self-pay | Admitting: Internal Medicine

## 2016-12-14 ENCOUNTER — Other Ambulatory Visit: Payer: Self-pay

## 2016-12-14 ENCOUNTER — Emergency Department (HOSPITAL_COMMUNITY): Payer: Medicare Other

## 2016-12-14 ENCOUNTER — Encounter (HOSPITAL_COMMUNITY): Payer: Self-pay | Admitting: Nurse Practitioner

## 2016-12-14 VITALS — BP 143/71 | HR 71 | Temp 98.0°F | Ht 65.0 in | Wt 155.5 lb

## 2016-12-14 DIAGNOSIS — E222 Syndrome of inappropriate secretion of antidiuretic hormone: Secondary | ICD-10-CM

## 2016-12-14 DIAGNOSIS — S062X9A Diffuse traumatic brain injury with loss of consciousness of unspecified duration, initial encounter: Secondary | ICD-10-CM

## 2016-12-14 DIAGNOSIS — K810 Acute cholecystitis: Secondary | ICD-10-CM | POA: Diagnosis not present

## 2016-12-14 DIAGNOSIS — Y9301 Activity, walking, marching and hiking: Secondary | ICD-10-CM | POA: Diagnosis present

## 2016-12-14 DIAGNOSIS — S0232XA Fracture of orbital floor, left side, initial encounter for closed fracture: Secondary | ICD-10-CM | POA: Diagnosis present

## 2016-12-14 DIAGNOSIS — Z9189 Other specified personal risk factors, not elsewhere classified: Secondary | ICD-10-CM

## 2016-12-14 DIAGNOSIS — I2510 Atherosclerotic heart disease of native coronary artery without angina pectoris: Secondary | ICD-10-CM | POA: Diagnosis present

## 2016-12-14 DIAGNOSIS — Z6825 Body mass index (BMI) 25.0-25.9, adult: Secondary | ICD-10-CM

## 2016-12-14 DIAGNOSIS — R339 Retention of urine, unspecified: Secondary | ICD-10-CM | POA: Diagnosis not present

## 2016-12-14 DIAGNOSIS — S065X9A Traumatic subdural hemorrhage with loss of consciousness of unspecified duration, initial encounter: Principal | ICD-10-CM | POA: Diagnosis present

## 2016-12-14 DIAGNOSIS — Z833 Family history of diabetes mellitus: Secondary | ICD-10-CM

## 2016-12-14 DIAGNOSIS — F419 Anxiety disorder, unspecified: Secondary | ICD-10-CM | POA: Diagnosis present

## 2016-12-14 DIAGNOSIS — F329 Major depressive disorder, single episode, unspecified: Secondary | ICD-10-CM | POA: Diagnosis present

## 2016-12-14 DIAGNOSIS — Z8249 Family history of ischemic heart disease and other diseases of the circulatory system: Secondary | ICD-10-CM

## 2016-12-14 DIAGNOSIS — Z79899 Other long term (current) drug therapy: Secondary | ICD-10-CM

## 2016-12-14 DIAGNOSIS — Z87820 Personal history of traumatic brain injury: Secondary | ICD-10-CM

## 2016-12-14 DIAGNOSIS — K922 Gastrointestinal hemorrhage, unspecified: Secondary | ICD-10-CM

## 2016-12-14 DIAGNOSIS — Z7982 Long term (current) use of aspirin: Secondary | ICD-10-CM

## 2016-12-14 DIAGNOSIS — K921 Melena: Secondary | ICD-10-CM

## 2016-12-14 DIAGNOSIS — K567 Ileus, unspecified: Secondary | ICD-10-CM | POA: Diagnosis not present

## 2016-12-14 DIAGNOSIS — K920 Hematemesis: Secondary | ICD-10-CM | POA: Diagnosis present

## 2016-12-14 DIAGNOSIS — K5900 Constipation, unspecified: Secondary | ICD-10-CM

## 2016-12-14 DIAGNOSIS — J9811 Atelectasis: Secondary | ICD-10-CM | POA: Diagnosis not present

## 2016-12-14 DIAGNOSIS — N400 Enlarged prostate without lower urinary tract symptoms: Secondary | ICD-10-CM | POA: Diagnosis present

## 2016-12-14 DIAGNOSIS — G936 Cerebral edema: Secondary | ICD-10-CM | POA: Diagnosis present

## 2016-12-14 DIAGNOSIS — E440 Moderate protein-calorie malnutrition: Secondary | ICD-10-CM | POA: Diagnosis present

## 2016-12-14 DIAGNOSIS — S0689AA Other specified intracranial injury with loss of consciousness status unknown, initial encounter: Secondary | ICD-10-CM

## 2016-12-14 DIAGNOSIS — E119 Type 2 diabetes mellitus without complications: Secondary | ICD-10-CM | POA: Diagnosis present

## 2016-12-14 DIAGNOSIS — Z87891 Personal history of nicotine dependence: Secondary | ICD-10-CM

## 2016-12-14 DIAGNOSIS — Z7984 Long term (current) use of oral hypoglycemic drugs: Secondary | ICD-10-CM

## 2016-12-14 DIAGNOSIS — I6200 Nontraumatic subdural hemorrhage, unspecified: Secondary | ICD-10-CM | POA: Diagnosis not present

## 2016-12-14 DIAGNOSIS — S0181XA Laceration without foreign body of other part of head, initial encounter: Secondary | ICD-10-CM | POA: Diagnosis present

## 2016-12-14 DIAGNOSIS — Z7983 Long term (current) use of bisphosphonates: Secondary | ICD-10-CM

## 2016-12-14 DIAGNOSIS — Z903 Acquired absence of stomach [part of]: Secondary | ICD-10-CM

## 2016-12-14 DIAGNOSIS — W1830XA Fall on same level, unspecified, initial encounter: Secondary | ICD-10-CM | POA: Diagnosis present

## 2016-12-14 DIAGNOSIS — I10 Essential (primary) hypertension: Secondary | ICD-10-CM | POA: Diagnosis present

## 2016-12-14 DIAGNOSIS — S065XAA Traumatic subdural hemorrhage with loss of consciousness status unknown, initial encounter: Secondary | ICD-10-CM | POA: Diagnosis present

## 2016-12-14 DIAGNOSIS — K219 Gastro-esophageal reflux disease without esophagitis: Secondary | ICD-10-CM | POA: Diagnosis present

## 2016-12-14 DIAGNOSIS — K819 Cholecystitis, unspecified: Secondary | ICD-10-CM

## 2016-12-14 DIAGNOSIS — E871 Hypo-osmolality and hyponatremia: Secondary | ICD-10-CM

## 2016-12-14 DIAGNOSIS — K659 Peritonitis, unspecified: Secondary | ICD-10-CM | POA: Diagnosis not present

## 2016-12-14 DIAGNOSIS — R109 Unspecified abdominal pain: Secondary | ICD-10-CM

## 2016-12-14 DIAGNOSIS — D62 Acute posthemorrhagic anemia: Secondary | ICD-10-CM | POA: Diagnosis not present

## 2016-12-14 LAB — COMPREHENSIVE METABOLIC PANEL
ALT: 36 U/L (ref 17–63)
AST: 35 U/L (ref 15–41)
Albumin: 4.3 g/dL (ref 3.5–5.0)
Alkaline Phosphatase: 75 U/L (ref 38–126)
Anion gap: 8 (ref 5–15)
BUN: 15 mg/dL (ref 6–20)
CALCIUM: 9.2 mg/dL (ref 8.9–10.3)
CHLORIDE: 88 mmol/L — AB (ref 101–111)
CO2: 26 mmol/L (ref 22–32)
CREATININE: 0.87 mg/dL (ref 0.61–1.24)
Glucose, Bld: 125 mg/dL — ABNORMAL HIGH (ref 65–99)
Potassium: 4.7 mmol/L (ref 3.5–5.1)
Sodium: 122 mmol/L — ABNORMAL LOW (ref 135–145)
TOTAL PROTEIN: 8.4 g/dL — AB (ref 6.5–8.1)
Total Bilirubin: 0.8 mg/dL (ref 0.3–1.2)

## 2016-12-14 LAB — CBC WITH DIFFERENTIAL/PLATELET
BASOS ABS: 0 10*3/uL (ref 0.0–0.1)
Basophils Relative: 0 %
Eosinophils Absolute: 0 10*3/uL (ref 0.0–0.7)
Eosinophils Relative: 0 %
HCT: 40.3 % (ref 39.0–52.0)
Hemoglobin: 14.6 g/dL (ref 13.0–17.0)
LYMPHS ABS: 1.3 10*3/uL (ref 0.7–4.0)
LYMPHS PCT: 8 %
MCH: 29.4 pg (ref 26.0–34.0)
MCHC: 36.2 g/dL — ABNORMAL HIGH (ref 30.0–36.0)
MCV: 81.1 fL (ref 78.0–100.0)
Monocytes Absolute: 1.2 10*3/uL — ABNORMAL HIGH (ref 0.1–1.0)
Monocytes Relative: 7 %
NEUTROS ABS: 14.3 10*3/uL — AB (ref 1.7–7.7)
NEUTROS PCT: 85 %
PLATELETS: 257 10*3/uL (ref 150–400)
RBC: 4.97 MIL/uL (ref 4.22–5.81)
RDW: 16.8 % — ABNORMAL HIGH (ref 11.5–15.5)
WBC: 16.9 10*3/uL — AB (ref 4.0–10.5)

## 2016-12-14 LAB — POC OCCULT BLOOD, ED: FECAL OCCULT BLD: NEGATIVE

## 2016-12-14 MED ORDER — LIDOCAINE-EPINEPHRINE (PF) 2 %-1:200000 IJ SOLN
10.0000 mL | Freq: Once | INTRAMUSCULAR | Status: AC
  Administered 2016-12-15: 10 mL
  Filled 2016-12-14: qty 20

## 2016-12-14 NOTE — ED Notes (Signed)
Pt. Document in error CT Cervical Spine Wo Contrast.

## 2016-12-14 NOTE — Patient Instructions (Signed)
General Instructions: - Check blood work and urine today - Start Senna-docusate to help with constipation - Need to get colonoscopy done as soon as possible  Please bring your medicines with you each time you come to clinic.  Medicines may include prescription medications, over-the-counter medications, herbal remedies, eye drops, vitamins, or other pills.   Progress Toward Treatment Goals:  No flowsheet data found.  Self Care Goals & Plans:  No flowsheet data found.  No flowsheet data found.   Care Management & Community Referrals:  No flowsheet data found.

## 2016-12-14 NOTE — ED Notes (Signed)
Pt is unable to urinate at this time. 
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
## 2016-12-14 NOTE — Progress Notes (Signed)
Received call in regards to pt  Reportedly fell with + LOC  Neuro intact with exception of confusion surrounding event CT scan reveals small 58mm SDH and left temporal lobe focal parenchymal contusion No surgical intervention indicated at this time He is going to be admitted under hospitalist service and transferred to [facility] He will need neuro exams q 1-2 hours Repeat CT scan in 12 hours. Sooner as needed for worsening exam Start Keppra 500mg  BID x7days for seizure prophylaxis.  Will see patient in morning. Call for any concerns

## 2016-12-14 NOTE — ED Provider Notes (Addendum)
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  Final Clinical Impressions(s) / ED Diagnoses   Final diagnoses:  SDH (subdural hematoma) (HCC)  Intracranial contusion (HCC)  Closed fracture of left orbital floor, initial encounter [facility])  Facial laceration, initial encounter    New Prescriptions New Prescriptions   No medications on file     Forde Dandy, MD 12/14/16 2356    Forde Dandy, MD 12/15/16 7425    Forde Dandy, MD 12/15/16 [id number]    Forde Dandy, MD 12/15/16 [phone]
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
## 2016-12-14 NOTE — ED Triage Notes (Signed)
Pt is presented from home by EMS who report pt had what sounds like a mechanical fall, missed a step, tripped before falling on a concrete surface. He has a frontal and nasal lacerations that he sustained from the fall. Denied cervical/neck pain, LOC or being on anticoagulants.

## 2016-12-15 ENCOUNTER — Inpatient Hospital Stay (HOSPITAL_COMMUNITY): Payer: Medicare Other

## 2016-12-15 DIAGNOSIS — Y9301 Activity, walking, marching and hiking: Secondary | ICD-10-CM

## 2016-12-15 DIAGNOSIS — R1310 Dysphagia, unspecified: Secondary | ICD-10-CM | POA: Diagnosis not present

## 2016-12-15 DIAGNOSIS — Z7984 Long term (current) use of oral hypoglycemic drugs: Secondary | ICD-10-CM

## 2016-12-15 DIAGNOSIS — S0181XA Laceration without foreign body of other part of head, initial encounter: Secondary | ICD-10-CM | POA: Diagnosis present

## 2016-12-15 DIAGNOSIS — F329 Major depressive disorder, single episode, unspecified: Secondary | ICD-10-CM

## 2016-12-15 DIAGNOSIS — Z7982 Long term (current) use of aspirin: Secondary | ICD-10-CM | POA: Diagnosis not present

## 2016-12-15 DIAGNOSIS — Z87891 Personal history of nicotine dependence: Secondary | ICD-10-CM

## 2016-12-15 DIAGNOSIS — R110 Nausea: Secondary | ICD-10-CM | POA: Diagnosis not present

## 2016-12-15 DIAGNOSIS — I6200 Nontraumatic subdural hemorrhage, unspecified: Secondary | ICD-10-CM | POA: Diagnosis present

## 2016-12-15 DIAGNOSIS — D62 Acute posthemorrhagic anemia: Secondary | ICD-10-CM | POA: Diagnosis not present

## 2016-12-15 DIAGNOSIS — K819 Cholecystitis, unspecified: Secondary | ICD-10-CM | POA: Diagnosis not present

## 2016-12-15 DIAGNOSIS — S065X9A Traumatic subdural hemorrhage with loss of consciousness of unspecified duration, initial encounter: Secondary | ICD-10-CM | POA: Diagnosis present

## 2016-12-15 DIAGNOSIS — J9811 Atelectasis: Secondary | ICD-10-CM | POA: Diagnosis not present

## 2016-12-15 DIAGNOSIS — E119 Type 2 diabetes mellitus without complications: Secondary | ICD-10-CM | POA: Diagnosis present

## 2016-12-15 DIAGNOSIS — R1084 Generalized abdominal pain: Secondary | ICD-10-CM | POA: Diagnosis not present

## 2016-12-15 DIAGNOSIS — W19XXXA Unspecified fall, initial encounter: Secondary | ICD-10-CM | POA: Diagnosis not present

## 2016-12-15 DIAGNOSIS — I2510 Atherosclerotic heart disease of native coronary artery without angina pectoris: Secondary | ICD-10-CM | POA: Diagnosis present

## 2016-12-15 DIAGNOSIS — Z9189 Other specified personal risk factors, not elsewhere classified: Secondary | ICD-10-CM | POA: Diagnosis not present

## 2016-12-15 DIAGNOSIS — K659 Peritonitis, unspecified: Secondary | ICD-10-CM | POA: Diagnosis not present

## 2016-12-15 DIAGNOSIS — Z8049 Family history of malignant neoplasm of other genital organs: Secondary | ICD-10-CM

## 2016-12-15 DIAGNOSIS — Z8719 Personal history of other diseases of the digestive system: Secondary | ICD-10-CM

## 2016-12-15 DIAGNOSIS — Z800 Family history of malignant neoplasm of digestive organs: Secondary | ICD-10-CM

## 2016-12-15 DIAGNOSIS — X58XXXD Exposure to other specified factors, subsequent encounter: Secondary | ICD-10-CM | POA: Diagnosis not present

## 2016-12-15 DIAGNOSIS — Y92009 Unspecified place in unspecified non-institutional (private) residence as the place of occurrence of the external cause: Secondary | ICD-10-CM | POA: Diagnosis not present

## 2016-12-15 DIAGNOSIS — K567 Ileus, unspecified: Secondary | ICD-10-CM | POA: Diagnosis not present

## 2016-12-15 DIAGNOSIS — Z87820 Personal history of traumatic brain injury: Secondary | ICD-10-CM | POA: Diagnosis not present

## 2016-12-15 DIAGNOSIS — F419 Anxiety disorder, unspecified: Secondary | ICD-10-CM | POA: Diagnosis present

## 2016-12-15 DIAGNOSIS — K920 Hematemesis: Secondary | ICD-10-CM | POA: Diagnosis present

## 2016-12-15 DIAGNOSIS — R339 Retention of urine, unspecified: Secondary | ICD-10-CM | POA: Diagnosis not present

## 2016-12-15 DIAGNOSIS — Z8249 Family history of ischemic heart disease and other diseases of the circulatory system: Secondary | ICD-10-CM

## 2016-12-15 DIAGNOSIS — I10 Essential (primary) hypertension: Secondary | ICD-10-CM | POA: Diagnosis present

## 2016-12-15 DIAGNOSIS — S065X1A Traumatic subdural hemorrhage with loss of consciousness of 30 minutes or less, initial encounter: Secondary | ICD-10-CM

## 2016-12-15 DIAGNOSIS — Z833 Family history of diabetes mellitus: Secondary | ICD-10-CM

## 2016-12-15 DIAGNOSIS — K810 Acute cholecystitis: Secondary | ICD-10-CM | POA: Diagnosis not present

## 2016-12-15 DIAGNOSIS — E222 Syndrome of inappropriate secretion of antidiuretic hormone: Secondary | ICD-10-CM | POA: Diagnosis present

## 2016-12-15 DIAGNOSIS — N400 Enlarged prostate without lower urinary tract symptoms: Secondary | ICD-10-CM | POA: Diagnosis present

## 2016-12-15 DIAGNOSIS — Z79899 Other long term (current) drug therapy: Secondary | ICD-10-CM | POA: Diagnosis not present

## 2016-12-15 DIAGNOSIS — Z903 Acquired absence of stomach [part of]: Secondary | ICD-10-CM | POA: Diagnosis not present

## 2016-12-15 DIAGNOSIS — D72829 Elevated white blood cell count, unspecified: Secondary | ICD-10-CM | POA: Diagnosis not present

## 2016-12-15 DIAGNOSIS — Y92008 Other place in unspecified non-institutional (private) residence as the place of occurrence of the external cause: Secondary | ICD-10-CM | POA: Diagnosis not present

## 2016-12-15 DIAGNOSIS — E871 Hypo-osmolality and hyponatremia: Secondary | ICD-10-CM | POA: Diagnosis not present

## 2016-12-15 DIAGNOSIS — S0232XA Fracture of orbital floor, left side, initial encounter for closed fracture: Secondary | ICD-10-CM

## 2016-12-15 DIAGNOSIS — S065XAA Traumatic subdural hemorrhage with loss of consciousness status unknown, initial encounter: Secondary | ICD-10-CM | POA: Diagnosis present

## 2016-12-15 DIAGNOSIS — Z7983 Long term (current) use of bisphosphonates: Secondary | ICD-10-CM | POA: Diagnosis not present

## 2016-12-15 DIAGNOSIS — Z978 Presence of other specified devices: Secondary | ICD-10-CM | POA: Diagnosis not present

## 2016-12-15 DIAGNOSIS — G936 Cerebral edema: Secondary | ICD-10-CM | POA: Diagnosis present

## 2016-12-15 DIAGNOSIS — S065X9D Traumatic subdural hemorrhage with loss of consciousness of unspecified duration, subsequent encounter: Secondary | ICD-10-CM | POA: Diagnosis not present

## 2016-12-15 DIAGNOSIS — E440 Moderate protein-calorie malnutrition: Secondary | ICD-10-CM | POA: Diagnosis present

## 2016-12-15 DIAGNOSIS — K219 Gastro-esophageal reflux disease without esophagitis: Secondary | ICD-10-CM | POA: Diagnosis present

## 2016-12-15 DIAGNOSIS — W1830XA Fall on same level, unspecified, initial encounter: Secondary | ICD-10-CM | POA: Diagnosis present

## 2016-12-15 LAB — URINALYSIS, ROUTINE W REFLEX MICROSCOPIC
Bilirubin Urine: NEGATIVE
GLUCOSE, UA: 50 mg/dL — AB
HGB URINE DIPSTICK: NEGATIVE
KETONES UR: NEGATIVE mg/dL
Leukocytes, UA: NEGATIVE
Nitrite: NEGATIVE
Protein, ur: NEGATIVE mg/dL
Specific Gravity, Urine: 1.015 (ref 1.005–1.030)
pH: 6 (ref 5.0–8.0)

## 2016-12-15 LAB — CBC
HEMATOCRIT: 36.4 % — AB (ref 39.0–52.0)
HEMOGLOBIN: 12.8 g/dL — AB (ref 13.0–17.0)
Hematocrit: 42.2 % (ref 37.5–51.0)
Hemoglobin: 14.3 g/dL (ref 13.0–17.7)
MCH: 28.1 pg (ref 26.6–33.0)
MCH: 28.4 pg (ref 26.0–34.0)
MCHC: 33.9 g/dL (ref 31.5–35.7)
MCHC: 35.2 g/dL (ref 30.0–36.0)
MCV: 83 fL (ref 79–97)
MCV: 80.7 fL (ref 78.0–100.0)
PLATELETS: 266 10*3/uL (ref 150–379)
Platelets: 227 10*3/uL (ref 150–400)
RBC: 5.08 x10E6/uL (ref 4.14–5.80)
RBC: 4.51 MIL/uL (ref 4.22–5.81)
RDW: 18.6 % — AB (ref 12.3–15.4)
RDW: 16.9 % — ABNORMAL HIGH (ref 11.5–15.5)
WBC: 7.5 10*3/uL (ref 3.4–10.8)
WBC: 12.3 10*3/uL — ABNORMAL HIGH (ref 4.0–10.5)

## 2016-12-15 LAB — BMP8+ANION GAP
Anion Gap: 16 mmol/L (ref 10.0–18.0)
BUN / CREAT RATIO: 15 (ref 10–24)
BUN: 12 mg/dL (ref 8–27)
CHLORIDE: 80 mmol/L — AB (ref 96–106)
CO2: 25 mmol/L (ref 18–29)
Calcium: 9.6 mg/dL (ref 8.6–10.2)
Creatinine, Ser: 0.79 mg/dL (ref 0.76–1.27)
GFR calc Af Amer: 104 mL/min/{1.73_m2} (ref >59)
GFR calc non Af Amer: 90 mL/min/{1.73_m2} (ref >59)
GLUCOSE: 121 mg/dL — AB (ref 65–99)
Potassium: 4.8 mmol/L (ref 3.5–5.2)
SODIUM: 121 mmol/L — AB (ref 134–144)

## 2016-12-15 LAB — TYPE AND SCREEN
ABO/RH(D): A POS
Antibody Screen: NEGATIVE

## 2016-12-15 LAB — ABO/RH: ABO/RH(D): A POS

## 2016-12-15 LAB — BASIC METABOLIC PANEL
Anion gap: 10 (ref 5–15)
BUN: 15 mg/dL (ref 6–20)
CALCIUM: 8.8 mg/dL — AB (ref 8.9–10.3)
CHLORIDE: 85 mmol/L — AB (ref 101–111)
CO2: 25 mmol/L (ref 22–32)
CREATININE: 0.89 mg/dL (ref 0.61–1.24)
GFR calc non Af Amer: >60 mL/min (ref >60)
Glucose, Bld: 140 mg/dL — ABNORMAL HIGH (ref 65–99)
Potassium: 4.7 mmol/L (ref 3.5–5.1)
Sodium: 120 mmol/L — ABNORMAL LOW (ref 135–145)

## 2016-12-15 LAB — GLUCOSE, CAPILLARY
GLUCOSE-CAPILLARY: 139 mg/dL — AB (ref 65–99)
Glucose-Capillary: 145 mg/dL — ABNORMAL HIGH (ref 65–99)
Glucose-Capillary: 189 mg/dL — ABNORMAL HIGH (ref 65–99)
Glucose-Capillary: 149 mg/dL — ABNORMAL HIGH (ref 65–99)

## 2016-12-15 LAB — SODIUM, URINE, RANDOM: Sodium, Ur: 115 mmol/L

## 2016-12-15 LAB — OSMOLALITY, URINE: Osmolality, Ur: 597 mOsm/kg (ref 300–900)

## 2016-12-15 LAB — PROTIME-INR
INR: 1.01
Prothrombin Time: 13.3 seconds (ref 11.4–15.2)

## 2016-12-15 LAB — MRSA PCR SCREENING: MRSA BY PCR: NEGATIVE

## 2016-12-15 MED ORDER — FENTANYL CITRATE (PF) 100 MCG/2ML IJ SOLN
50.0000 ug | Freq: Once | INTRAMUSCULAR | Status: AC
  Administered 2016-12-15: 50 ug via INTRAVENOUS
  Filled 2016-12-15: qty 2

## 2016-12-15 MED ORDER — HYDROCODONE-ACETAMINOPHEN 5-325 MG PO TABS
1.0000 | ORAL_TABLET | ORAL | Status: DC | PRN
  Administered 2016-12-15 – 2016-12-19 (×15): 1 via ORAL
  Filled 2016-12-15 (×16): qty 1

## 2016-12-15 MED ORDER — ACETAMINOPHEN 650 MG RE SUPP: 650.0000 mg | Freq: Four times a day (QID) | RECTAL | Status: DC | PRN

## 2016-12-15 MED ORDER — ONDANSETRON HCL 4 MG PO TABS
4.0000 mg | ORAL_TABLET | Freq: Four times a day (QID) | ORAL | Status: DC | PRN
Start: 2016-12-15 — End: 2016-12-20

## 2016-12-15 MED ORDER — PANTOPRAZOLE SODIUM 40 MG IV SOLR
40.0000 mg | Freq: Once | INTRAVENOUS | Status: AC
  Administered 2016-12-15: 40 mg via INTRAVENOUS
  Filled 2016-12-15: qty 40

## 2016-12-15 MED ORDER — ONDANSETRON HCL 4 MG/2ML IJ SOLN
4.0000 mg | Freq: Four times a day (QID) | INTRAMUSCULAR | Status: DC | PRN
  Administered 2016-12-18: 4 mg via INTRAVENOUS
  Filled 2016-12-15: qty 2

## 2016-12-15 MED ORDER — SODIUM CHLORIDE 0.9% FLUSH
3.0000 mL | Freq: Two times a day (BID) | INTRAVENOUS | Status: DC
  Administered 2016-12-16 – 2016-12-22 (×8): 3 mL via INTRAVENOUS

## 2016-12-15 MED ORDER — ONDANSETRON HCL 4 MG/2ML IJ SOLN
INTRAMUSCULAR | Status: AC
  Administered 2016-12-15: 2 mg
  Filled 2016-12-15: qty 2

## 2016-12-15 MED ORDER — FENTANYL CITRATE (PF) 100 MCG/2ML IJ SOLN
25.0000 ug | Freq: Once | INTRAMUSCULAR | Status: AC
  Administered 2016-12-15: 25 ug via INTRAVENOUS
  Filled 2016-12-15: qty 2

## 2016-12-15 MED ORDER — HYDRALAZINE HCL 20 MG/ML IJ SOLN
5.0000 mg | INTRAMUSCULAR | Status: DC | PRN
  Administered 2016-12-15 – 2016-12-18 (×9): 5 mg via INTRAVENOUS
  Filled 2016-12-15 (×11): qty 1

## 2016-12-15 MED ORDER — LEVETIRACETAM 500 MG PO TABS
500.0000 mg | ORAL_TABLET | Freq: Two times a day (BID) | ORAL | Status: DC
  Administered 2016-12-15 – 2016-12-18 (×7): 500 mg via ORAL
  Filled 2016-12-15 (×7): qty 1

## 2016-12-15 MED ORDER — DEXAMETHASONE SODIUM PHOSPHATE 10 MG/ML IJ SOLN
10.0000 mg | Freq: Once | INTRAMUSCULAR | Status: AC
  Administered 2016-12-15: 10 mg via INTRAVENOUS
  Filled 2016-12-15: qty 1

## 2016-12-15 MED ORDER — ONDANSETRON HCL 4 MG/2ML IJ SOLN
4.0000 mg | Freq: Once | INTRAMUSCULAR | Status: AC
  Administered 2016-12-15: 4 mg via INTRAVENOUS
  Filled 2016-12-15: qty 2

## 2016-12-15 MED ORDER — ACETAMINOPHEN 325 MG PO TABS
650.0000 mg | ORAL_TABLET | Freq: Four times a day (QID) | ORAL | Status: DC | PRN
  Administered 2016-12-15: 650 mg via ORAL
  Filled 2016-12-15 (×2): qty 2

## 2016-12-15 NOTE — ED Notes (Addendum)
Pt c/o pain to face and back, Dr. Tye Maryland and Polina made aware, Fentanyl order.

## 2016-12-15 NOTE — ED Notes (Signed)
Bladder scanned 400+ urinary retention is noted

## 2016-12-15 NOTE — ED Notes (Signed)
Pt had 2nd large amt of bloody emesis. Pt cleaned and gown changed. Pt continues to have pain in head. MD Polino made aware.

## 2016-12-15 NOTE — ED Notes (Signed)
Pt made aware urine specimen is needed.  

## 2016-12-15 NOTE — Progress Notes (Signed)
Repeat CT Head  IMPRESSION: 1. The left temporal intraparenchymal hemorrhage measures slightly larger in 1 dimension today. Whether this is a tiny amount of true enlargement or simply due to difference in slice selection is unclear. Recommend attention on follow-up. 2. The left subdural hematoma measures 3 mm, unchanged. 3. 4 mm of left-to-right midline shift today versus 6 mm previously. 4. A small amount of subarachnoid hemorrhage posteriorly in the right sylvian fissure may be secondary to the subarachnoid hemorrhage adjacent to the left temporal hemorrhage. Recommend attention on follow-up. 5. No other interval change.   Repeat head CT without acute worsening. This is reassuring. Continue to monitor neuro exam.  No need for repeat head CT unless exam changes. Discussed with family at bedside.

## 2016-12-15 NOTE — H&P (Addendum)
STAFF NOTE: I, Dr Ann Lions have personally reviewed patient's available data, including medical history, events of note, physical examination and test results as part of my evaluation. I have discussed with resident/NP and other care providers such as pharmacist, RN and RRT.  In addition,  I personally evaluated patient and elicited key findings of   S: 71 year old spanish only speaking [place] immigrant who lives in ALF  . Has chronic hyponatremia. s/p accidental  fall and subdural associated with facial laceration, echhymoses and intraorbital fracture. Since admit 12/14/2016 - CT 12/15/2016 shwed new findings. CAre upgraded from sdu to full icu and IMTS asking CCCM to be primary. NSGY monitoring  O: alert and oriented x 3 Facial echymoses and raccoon eyes + Moves all 4s CTA bilterally   Recent Labs Lab 12/14/16 1050 12/14/16 2329 12/15/16 0728  HGB  --  14.6 12.8*  HCT 42.2 40.3 36.4*  WBC 7.5 16.9* 12.3*  PLT 266 257 227    Recent Labs Lab 12/14/16 1050 12/14/16 2329 12/15/16 0728  NA 121* 122* 120*  K 4.8 4.7 4.7  CL 80* 88* 85*  CO2 25 26 25   GLUCOSE 121* 125* 140*  BUN 12 15 15   CREATININE 0.79 0.87 0.89  CALCIUM 9.6 9.2 8.8*   No results for input(s): LATICACIDVEN, PROCALCITON in the last 168 hours.  Ct head 12/15/2016 9am - new iIP hematoma and SDH with 60mm shift  A: SDH with 9mm midline shift and IP h ematoma - traumatic due to fall Fall - could easily be related to chronic hyponatremia INtact mentatl status and protecting airway but at high risk for decompensation  P: agree ICU Full code - so if gets worse intubate Rest per app   .  Rest per NP/medical resident whose note is outlined above and that I agree with  The patient is critically ill with multiple organ systems failure and requires high complexity decision making for assessment and support, frequent evaluation and titration of therapies, application of advanced monitoring technologies and extensive  interpretation of multiple databases.   Critical Care Time devoted to patient care services described in this note is  30  Minutes. This time reflects time of care of this signee Dr Brand Males. This critical care time does not reflect procedure time, or teaching time or supervisory time of PA/NP/Med student/Med Resident etc but could involve care discussion time    Dr. Brand Males, M.D., [facility].C.P Pulmonary and Critical Care Medicine Staff Physician [place] [facility] Pager: [phone], If no answer or between  15:00h - 7:00h: call [phone]  12/15/2016 4:00 PM

## 2016-12-15 NOTE — ED Notes (Addendum)
Pt had large bloody emesis. Dr, Tye Maryland, and Dr. Rockne Coons made aware

## 2016-12-15 NOTE — Progress Notes (Signed)
[facility] Progress Note Patient Name: [name] DOB: 18-Jul-1946 MRN: [id number]   Date of Service  12/15/2016  HPI/Events of Note  BP 151/74  eICU Interventions  Hydralazine PRN     Intervention Category Major Interventions: Hypertension - evaluation and management  Constancia Geeting 12/15/2016, 10:15 PM
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
## 2016-12-15 NOTE — Consult Note (Signed)
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 He was seen at his PCPs office yesterday for follow-up, however afterwards with walking to his mailbox he fell hit his head and brief loss of consciousness. This was witnessed by his neighbor who called EMS. In the ED he was noted to have multiple facial lacerations and hematemesis and a CT scan showed a subdural hematoma as well as a nondisplaced left orbital floor fracture. He otherwise was found by the ED to have intact neurological status. Neurosurgery was consulted and recommended admission with repeat CT scan in 12 hours frequent neuro checks and prophylactic Keppra. He was transferred from [facility] to [facility] after repeat CT scan this morning, shows the previous findings as well as a new 2 cm x 4 cm intraparenchymal hematoma with some vasogenic edema and mild mass effect. Clinically he was more lethargic but otherwise grossly oriented and following commands ( Through interpreter) Because of this change in his bleed, he will be monitored in the Neuro ICU, and CCM will assume care.  PAST MEDICAL HISTORY :  He  has a past medical history of Anemia; Anxiety; Diabetes mellitus without complication ([place]); Diverticulosis of colon (without mention of hemorrhage); GERD (gastroesophageal reflux disease); Hypertension; and Traumatic brain injury ([place]) (remote).  PAST SURGICAL HISTORY: He  has a past surgical history that includes Partial gastrectomy; Back surgery; and laparotomy (remote).  No Known Allergies  No current facility-administered  medications on file prior to encounter.    Current Outpatient Prescriptions on File Prior to Encounter  Medication Sig  . aspirin 81 MG chewable tablet Chew 81 mg by mouth daily.  Marland Kitchen atorvastatin (LIPITOR) 80 MG tablet Take 80 mg by mouth daily.  Marland Kitchen docusate sodium (COLACE) 100 MG capsule Take 1 capsule (100 mg total) by mouth 2 (two) times daily. (Patient not taking: Reported on 11/16/2016)  . ferrous sulfate 325 (65 FE) MG EC tablet Take 1 tablet (325 mg total) by mouth 2 (two) times daily.  Marland Kitchen lisinopril (PRINIVIL,ZESTRIL) 20 MG tablet Take 1 tablet (20 mg total) by mouth daily.  . metFORMIN (GLUCOPHAGE) 500 MG tablet Take 500 mg by mouth 2 (two) times daily with a meal.  . metoprolol succinate (TOPROL-XL) 50 MG 24 hr tablet Take 50 mg by mouth daily.  . nitroGLYCERIN (NITROSTAT) 0.4 MG SL tablet Place 1 tablet (0.4 mg total) under the tongue daily. (Patient taking differently: Place 0.4 mg under the tongue every 5 (five) minutes as needed for chest pain. )  . pregabalin (LYRICA) 25 MG capsule Take 1 capsule (25 mg total) by mouth 3 (three) times daily.  . sennosides-docusate sodium (SENOKOT-S) 8.6-50 MG tablet Take 2 tablets by mouth daily.  . sodium chloride 1 g tablet Take 2 tablets (2 g total) by mouth 2 (two) times daily with a meal.  . tamsulosin (FLOMAX) 0.4 MG CAPS capsule Take 1 capsule (0.4 mg total) by mouth daily after breakfast.    FAMILY HISTORY:  His indicated that his mother is deceased. He indicated that his father is deceased. He indicated that the status of his neg hx is unknown.    SOCIAL HISTORY:  He  reports that he has quit smoking. He has never used smokeless tobacco. He reports that he does not drink alcohol or use drugs.  REVIEW OF SYSTEMS:   Gen: Denies fever, chills, weight change, fatigue, night sweats HEENT: + blurred vision ( right eye almost swollen shut), no double vision, hearing loss, tinnitus, sinus congestion, rhinorrhea, sore throat, neck stiffness,  dysphagia,  PULM: Denies shortness of breath, cough, sputum production, hemoptysis, wheezing CV: Denies chest pain, edema, orthopnea, paroxysmal nocturnal dyspnea, palpitations GI: Denies abdominal pain, +nausea, +vomiting, no diarrhea, hematochezia, melena, +constipation, no change in bowel habits GU: Denies dysuria, hematuria, polyuria, oliguria, urethral discharge Endocrine: Denies hot or cold intolerance, polyuria, polyphagia or appetite change Derm: Denies rash, dry skin, scaling or peeling skin change Heme: Denies easy bruising, bleeding, bleeding gums Neuro: Denies headache, numbness, weakness, slurred speech, loss of memory or consciousness  SUBJECTIVE:  Through interpreter, states he is feeling ok. States he has some memory issues. His face is sore.   VITAL SIGNS: BP (!) 149/72   Pulse 84   Temp 98.1 F (36.7 C) (Oral)   Resp 16   Ht 5\' 5"  (1.651 m)   Wt 155 lb (70.3 kg)   SpO2 98%   BMI 25.79 kg/m   HEMODYNAMICS:    VENTILATOR SETTINGS:    INTAKE / OUTPUT: I/O last 3 completed shifts: In: -  Out: 55 [Urine:55]  PHYSICAL EXAMINATION: General:  Awake and alert elderly male, supine in bed, following commands, facial bruising Neuro:  Awake and alert, following commands, MAE x 4,Oriented x 3 HEENT: Multiple facial lacerations and bruising, sutures over left eye intact. Left eye almost swollen shut., raccoon eyes Cardiovascular: RRR, S1, S2, No RMG Lungs:  Clear throughout, no nasal flaring, sternal retractions, regular rate, NAD Abdomen: Soft and flat, Non-distended, non-tender, BS + Musculoskeletal: No obvious deformities Skin: Intact, warm and dry, multiple bruises and abrasions from fall  LABS:  BMET  Recent Labs Lab 12/14/16 1050 12/14/16 2329 12/15/16 0728  NA 121* 122* 120*  K 4.8 4.7 4.7  CL 80* 88* 85*  CO2 25 26 25   BUN 12 15 15   CREATININE 0.79 0.87 0.89  GLUCOSE 121* 125* 140*    Electrolytes  Recent Labs Lab 12/14/16 1050  12/14/16 2329 12/15/16 0728  CALCIUM 9.6 9.2 8.8*    CBC  Recent Labs Lab 12/14/16 1050 12/14/16 2329 12/15/16 0728  WBC 7.5 16.9* 12.3*  HGB  --  14.6 12.8*  HCT 42.2 40.3 36.4*  PLT 266 257 227    Coag's  Recent Labs Lab 12/15/16 0728  INR 1.01    Sepsis Markers No results for input(s): LATICACIDVEN, PROCALCITON, O2SATVEN in the last 168 hours.  ABG No results for input(s): PHART, PCO2ART, PO2ART in the last 168 hours.  Liver Enzymes  Recent Labs Lab 12/14/16 2329  AST 35  ALT 36  ALKPHOS 75  BILITOT 0.8  ALBUMIN 4.3    Cardiac Enzymes No results for input(s): TROPONINI, PROBNP in the last 168 hours.  Glucose  Recent Labs Lab 12/15/16 1133  GLUCAP 145*    Imaging Ct Head Wo Contrast  Result Date: 12/15/2016 CLINICAL DATA:  Patient vomiting, subdural hematoma. EXAM: CT HEAD WITHOUT CONTRAST TECHNIQUE: Contiguous axial images were obtained from the base of the skull through the vertex without intravenous contrast. COMPARISON:  12/14/2016 FINDINGS: Brain: No evidence of acute infarction. Stable left subdural hematoma measuring 3 mm in thickness. Interval development of a 2.3 x 4 cm intraparenchymal hematoma  in the left temporoparietal lobe with surrounding vasogenic edema. Mass effect on the adjacent temporal horn of the lateral ventricle. 6 mm of left-to-right midline shift. Periventricular white matter low attenuation as can be seen with microvascular disease. Vascular: No hyperdense vessel or unexpected calcification. Skull: No osseous abnormality. Sinuses/Orbits: Visualized paranasal sinuses are clear. Visualized mastoid sinuses are clear. Visualized orbits demonstrate no focal abnormality. Other: None IMPRESSION: 1. Interval development of a 2.3 x 4 cm intraparenchymal hematoma in the left temporoparietal lobe with surrounding vasogenic edema. Mass effect on the adjacent temporal horn of the lateral ventricle. 6 mm of left-to-right midline shift. 2.  Stable left subdural hematoma measuring 3 mm in thickness. Critical Value/emergent results were called by telephone at the time of interpretation on 12/15/2016 at 9:18 am to Dr. Joni Reining , who verbally acknowledged these results. Electronically Signed   By: Kathreen Devoid   On: 12/15/2016 09:19   Ct Head Wo Contrast  Result Date: 12/14/2016 CLINICAL DATA:  71 year old male with fall and loss of consciousness. EXAM: CT HEAD WITHOUT CONTRAST CT MAXILLOFACIAL WITHOUT CONTRAST CT CERVICAL SPINE WITHOUT CONTRAST TECHNIQUE: Multidetector CT imaging of the head, cervical spine, and maxillofacial structures were performed using the standard protocol without intravenous contrast. Multiplanar CT image reconstructions of the cervical spine and maxillofacial structures were also generated. COMPARISON:  Head CT dated 03/20/2014 and brain MRI dated 11/16/2016 FINDINGS: CT HEAD FINDINGS Brain: There is a left hemispheric subdural hemorrhage measuring up to 3 mm in thickness over the left temporal lobe. A 13 x 12 mm high attenuating focus in the inferior aspect of the left temporal lobe (coronal series 13, image 42 and axial series 10, image 9) likely represent an area of parenchymal contusion or hemorrhage. No intraventricular bleed noted. There is no associated significant mass effect. No midline shift. The ventricle and sulci are appropriate in size for patient's age. Moderate periventricular and deep white matter chronic microvascular ischemic changes seen. Vascular: No hyperdense vessel or unexpected calcification. Skull: Normal. Negative for fracture or focal lesion. Other: Multiple scalp hematoma noted over the forehead. CT MAXILLOFACIAL FINDINGS Osseous: There is a slight irregularity of the left orbital floor with small adjacent pockets of air most consistent with a nondisplaced fracture. There is a lucency in the posterolateral wall of the left maxillary sinus which may represent a suture or a nondisplaced fracture.  There is slight irregularity of the anterior wall of the left maxillary sinus. There is incomplete visualization of the medial wall of the left maxillary sinus probably related to remodeling secondary to chronic sinusitis. Acute fracture is not entirely excluded. No other acute fracture identified. Orbits: The globes and retro-orbital fat are preserved. There are bilateral cataract surgeries. There are small pockets of left orbital air. Sinuses: There is diffuse mucoperiosteal thickening of paranasal sinuses with complete opacification of the left maxillary sinus. The opacification of the left maxillary sinus is felt to represent chronic sinusitis and less likely an acute traumatic hemosinus. No air-fluid level. The mastoid air cells are clear. Soft tissues: Left facial and periorbital hematoma. There is soft tissue swelling and contusion of the nose. CT CERVICAL SPINE FINDINGS Alignment: No acute subluxation. There is mild reversal of normal cervical lordosis likely related to degenerative changes. Skull base and vertebrae: No acute fracture. The bones are osteopenic. Soft tissues and spinal canal: No prevertebral fluid or swelling. No visible canal hematoma. Disc levels: There multilevel degenerative changes with endplate irregularity and disc space narrowing most prominent at C4-C5 and  C5-C6. There is grade 1 C3-C4 anterolisthesis. Multilevel facet hypertrophy most prominent at right C3-C4 with associated narrowing of the neural foramina. Upper chest: Biapical subpleural scarring and paraseptal emphysema. Other: None IMPRESSION: 1. Small left hemispheric subdural hemorrhage measuring approximately 3 mm in thickness. A somewhat rounded focal parenchymal hyperdensity involving the inferior aspect of the left temporal lobe may represent a parenchymal contusion or hemorrhage. Follow-up recommended. There is no associated mass effect or midline shift. 2. Moderate chronic microvascular ischemic changes. 3. Probable  nondisplaced fracture of the left orbital floor with small left orbital emphysema. Irregularity of the anterior and posterolateral walls of the left maxillary sinus may be chronic or represent sutures. A nondisplaced fracture is not excluded. 4. Diffuse mucoperiosteal thickening of paranasal sinuses. There is complete opacification of the left maxillary sinus, likely representing chronic sinusitis and less likely an acute traumatic hemosinus. 5. No acute/ traumatic cervical spine pathology. Multilevel degenerative changes. 6. Soft tissue hematoma over the forehead as well as facial contusions. These results were called by telephone at the time of interpretation on 12/14/2016 at 10:45 pm to Dr. Brantley Stage , who verbally acknowledged these results. Electronically Signed   By: Anner Crete M.D.   On: 12/14/2016 22:48   Ct Cervical Spine Wo Contrast  Result Date: 12/14/2016 CLINICAL DATA:  72 year old male with fall and loss of consciousness. EXAM: CT HEAD WITHOUT CONTRAST CT MAXILLOFACIAL WITHOUT CONTRAST CT CERVICAL SPINE WITHOUT CONTRAST TECHNIQUE: Multidetector CT imaging of the head, cervical spine, and maxillofacial structures were performed using the standard protocol without intravenous contrast. Multiplanar CT image reconstructions of the cervical spine and maxillofacial structures were also generated. COMPARISON:  Head CT dated 03/20/2014 and brain MRI dated 11/16/2016 FINDINGS: CT HEAD FINDINGS Brain: There is a left hemispheric subdural hemorrhage measuring up to 3 mm in thickness over the left temporal lobe. A 13 x 12 mm high attenuating focus in the inferior aspect of the left temporal lobe (coronal series 13, image 42 and axial series 10, image 9) likely represent an area of parenchymal contusion or hemorrhage. No intraventricular bleed noted. There is no associated significant mass effect. No midline shift. The ventricle and sulci are appropriate in size for patient's age. Moderate periventricular  and deep white matter chronic microvascular ischemic changes seen. Vascular: No hyperdense vessel or unexpected calcification. Skull: Normal. Negative for fracture or focal lesion. Other: Multiple scalp hematoma noted over the forehead. CT MAXILLOFACIAL FINDINGS Osseous: There is a slight irregularity of the left orbital floor with small adjacent pockets of air most consistent with a nondisplaced fracture. There is a lucency in the posterolateral wall of the left maxillary sinus which may represent a suture or a nondisplaced fracture. There is slight irregularity of the anterior wall of the left maxillary sinus. There is incomplete visualization of the medial wall of the left maxillary sinus probably related to remodeling secondary to chronic sinusitis. Acute fracture is not entirely excluded. No other acute fracture identified. Orbits: The globes and retro-orbital fat are preserved. There are bilateral cataract surgeries. There are small pockets of left orbital air. Sinuses: There is diffuse mucoperiosteal thickening of paranasal sinuses with complete opacification of the left maxillary sinus. The opacification of the left maxillary sinus is felt to represent chronic sinusitis and less likely an acute traumatic hemosinus. No air-fluid level. The mastoid air cells are clear. Soft tissues: Left facial and periorbital hematoma. There is soft tissue swelling and contusion of the nose. CT CERVICAL SPINE FINDINGS Alignment: No acute subluxation.  There is mild reversal of normal cervical lordosis likely related to degenerative changes. Skull base and vertebrae: No acute fracture. The bones are osteopenic. Soft tissues and spinal canal: No prevertebral fluid or swelling. No visible canal hematoma. Disc levels: There multilevel degenerative changes with endplate irregularity and disc space narrowing most prominent at C4-C5 and C5-C6. There is grade 1 C3-C4 anterolisthesis. Multilevel facet hypertrophy most prominent at  right C3-C4 with associated narrowing of the neural foramina. Upper chest: Biapical subpleural scarring and paraseptal emphysema. Other: None IMPRESSION: 1. Small left hemispheric subdural hemorrhage measuring approximately 3 mm in thickness. A somewhat rounded focal parenchymal hyperdensity involving the inferior aspect of the left temporal lobe may represent a parenchymal contusion or hemorrhage. Follow-up recommended. There is no associated mass effect or midline shift. 2. Moderate chronic microvascular ischemic changes. 3. Probable nondisplaced fracture of the left orbital floor with small left orbital emphysema. Irregularity of the anterior and posterolateral walls of the left maxillary sinus may be chronic or represent sutures. A nondisplaced fracture is not excluded. 4. Diffuse mucoperiosteal thickening of paranasal sinuses. There is complete opacification of the left maxillary sinus, likely representing chronic sinusitis and less likely an acute traumatic hemosinus. 5. No acute/ traumatic cervical spine pathology. Multilevel degenerative changes. 6. Soft tissue hematoma over the forehead as well as facial contusions. These results were called by telephone at the time of interpretation on 12/14/2016 at 10:45 pm to Dr. Brantley Stage , who verbally acknowledged these results. Electronically Signed   By: Anner Crete M.D.   On: 12/14/2016 22:48   Ct Maxillofacial Wo Contrast  Result Date: 12/14/2016 CLINICAL DATA:  71 year old male with fall and loss of consciousness. EXAM: CT HEAD WITHOUT CONTRAST CT MAXILLOFACIAL WITHOUT CONTRAST CT CERVICAL SPINE WITHOUT CONTRAST TECHNIQUE: Multidetector CT imaging of the head, cervical spine, and maxillofacial structures were performed using the standard protocol without intravenous contrast. Multiplanar CT image reconstructions of the cervical spine and maxillofacial structures were also generated. COMPARISON:  Head CT dated 03/20/2014 and brain MRI dated 11/16/2016  FINDINGS: CT HEAD FINDINGS Brain: There is a left hemispheric subdural hemorrhage measuring up to 3 mm in thickness over the left temporal lobe. A 13 x 12 mm high attenuating focus in the inferior aspect of the left temporal lobe (coronal series 13, image 42 and axial series 10, image 9) likely represent an area of parenchymal contusion or hemorrhage. No intraventricular bleed noted. There is no associated significant mass effect. No midline shift. The ventricle and sulci are appropriate in size for patient's age. Moderate periventricular and deep white matter chronic microvascular ischemic changes seen. Vascular: No hyperdense vessel or unexpected calcification. Skull: Normal. Negative for fracture or focal lesion. Other: Multiple scalp hematoma noted over the forehead. CT MAXILLOFACIAL FINDINGS Osseous: There is a slight irregularity of the left orbital floor with small adjacent pockets of air most consistent with a nondisplaced fracture. There is a lucency in the posterolateral wall of the left maxillary sinus which may represent a suture or a nondisplaced fracture. There is slight irregularity of the anterior wall of the left maxillary sinus. There is incomplete visualization of the medial wall of the left maxillary sinus probably related to remodeling secondary to chronic sinusitis. Acute fracture is not entirely excluded. No other acute fracture identified. Orbits: The globes and retro-orbital fat are preserved. There are bilateral cataract surgeries. There are small pockets of left orbital air. Sinuses: There is diffuse mucoperiosteal thickening of paranasal sinuses with complete opacification of the left maxillary  sinus. The opacification of the left maxillary sinus is felt to represent chronic sinusitis and less likely an acute traumatic hemosinus. No air-fluid level. The mastoid air cells are clear. Soft tissues: Left facial and periorbital hematoma. There is soft tissue swelling and contusion of the nose.  CT CERVICAL SPINE FINDINGS Alignment: No acute subluxation. There is mild reversal of normal cervical lordosis likely related to degenerative changes. Skull base and vertebrae: No acute fracture. The bones are osteopenic. Soft tissues and spinal canal: No prevertebral fluid or swelling. No visible canal hematoma. Disc levels: There multilevel degenerative changes with endplate irregularity and disc space narrowing most prominent at C4-C5 and C5-C6. There is grade 1 C3-C4 anterolisthesis. Multilevel facet hypertrophy most prominent at right C3-C4 with associated narrowing of the neural foramina. Upper chest: Biapical subpleural scarring and paraseptal emphysema. Other: None IMPRESSION: 1. Small left hemispheric subdural hemorrhage measuring approximately 3 mm in thickness. A somewhat rounded focal parenchymal hyperdensity involving the inferior aspect of the left temporal lobe may represent a parenchymal contusion or hemorrhage. Follow-up recommended. There is no associated mass effect or midline shift. 2. Moderate chronic microvascular ischemic changes. 3. Probable nondisplaced fracture of the left orbital floor with small left orbital emphysema. Irregularity of the anterior and posterolateral walls of the left maxillary sinus may be chronic or represent sutures. A nondisplaced fracture is not excluded. 4. Diffuse mucoperiosteal thickening of paranasal sinuses. There is complete opacification of the left maxillary sinus, likely representing chronic sinusitis and less likely an acute traumatic hemosinus. 5. No acute/ traumatic cervical spine pathology. Multilevel degenerative changes. 6. Soft tissue hematoma over the forehead as well as facial contusions. These results were called by telephone at the time of interpretation on 12/14/2016 at 10:45 pm to Dr. Brantley Stage , who verbally acknowledged these results. Electronically Signed   By: Anner Crete M.D.   On: 12/14/2016 22:48     STUDIES:  4/27>> CT Cervical  Spine 4/27>> CT Head with contrast 4/28 >> CT Maxillofacial   CULTURES: MRSA>> 4/28  ANTIBIOTICS: None at present  SIGNIFICANT EVENTS: 4/27>> Fall/ SDH  LINES/TUBES: PIV  DISCUSSION: SDH with 3 mm midline shift and IP hematoma and vasogenic edema. This was a traumatic injury due to witnessed fall, suspect could be related to chronic hyponatremia. Mental status is intact upon exam ( Pt. 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 Pt. Is high risk for decompensation and therefore was moved to ICU, where CCM has assumed care.   ASSESSMENT / PLAN:  PULMONARY A: No Acute Issues Saturations 100% on RA P:   Maintain saturations > 94% Monitor for any change in ability to protect airway. CXR prn Early intervention for airway protection if bleed progresses and pt decompensates  CARDIOVASCULAR A:  No acute issues Normotensive P:  Telemetry monitoring Maintain SBP < 140 Maintain MAP >65 Hold home lisinopril for now  RENAL A:   Chronic Hyponatremia/SIADH Concerning in setting of ICH Decreased Urine output/ Bladder scan 405cc's P:   PO Fluid restriction Consider low rate NS  Monitor Urine output Replete electrolytes as needed May need hypertonic saline if concern for increased ICP.  GASTROINTESTINAL A:   Nausea and vomiting/ Hematemesis in ED 4/27  Abdomen is soft, non-distended Recent endo 10/2016>> esophogeal thickening P:   CT scan per neuro  to re-evaluate SAH ( Ensure vomiting not 2/2 rising ICP) NPO  Monitor for further hematemesis SUP  HEMATOLOGIC A:   Leukocytosis>> down trending INR 1.01 P:  CBC daily Monitor Coags prn Transfuse for HGB > 7   INFECTIOUS A:   Leukocytosis down trending( Suspect reactive) No obvious source of infection P:   Trend WBC and Fever curve Culture if clinically indicated ABX if clinically indicated  ENDOCRINE A:   DM A1C last year 5.9 Home Metformin  P:   Hold Metformin for  now CBG's Q4 SSI  NEUROLOGIC A:   SDH with increase in size and shift 4/28 P:   Per Neurosurgery ICU admission Continue Keppra BID x 7 days Decadron 10 mg  x 1 Neuro checks Q1 Follow up CT if exam changes NPO until Neurosurgery clears diet   RASS goal:0- 1    FAMILY  - Updates: Family updated at bedside 4/28 prior to going for CT scan  - Inter-disciplinary family meet or Palliative Care meeting due by: 5/4day   Magdalen Spatz, AGACNP-BC [place] Pager: [phone]  12/15/2016, 3:48 PM

## 2016-12-15 NOTE — ED Notes (Signed)
Report given to [place], RN at [facility]
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
## 2016-12-15 NOTE — ED Notes (Signed)
Patient unable to void-bladder scan >500-In and out completed with 550 cc's urine obtained. Medicated for generalized pain. Granddaughter at bedside.

## 2016-12-15 NOTE — ED Notes (Addendum)
Contacted Dr. Joni Reining regarding obtaining medication for pt pain and to make him aware pt vomiting bloody emesis. MD stated that he was not covering this pt and referred me to resident contact number. Contact made awaiting response. Dr. Benjamine Mola returned call and stated that he was not comfortable writing orders for a pt in which he has not seen at this time, and would make contact with Dr.  Betsey Holiday regarding pt care.

## 2016-12-16 DIAGNOSIS — I6200 Nontraumatic subdural hemorrhage, unspecified: Secondary | ICD-10-CM

## 2016-12-16 DIAGNOSIS — E871 Hypo-osmolality and hyponatremia: Secondary | ICD-10-CM

## 2016-12-16 DIAGNOSIS — K5900 Constipation, unspecified: Secondary | ICD-10-CM | POA: Insufficient documentation

## 2016-12-16 LAB — BASIC METABOLIC PANEL
ANION GAP: 13 (ref 5–15)
BUN: 15 mg/dL (ref 6–20)
CO2: 23 mmol/L (ref 22–32)
Calcium: 8.9 mg/dL (ref 8.9–10.3)
Chloride: 84 mmol/L — ABNORMAL LOW (ref 101–111)
Creatinine, Ser: 0.81 mg/dL (ref 0.61–1.24)
GFR calc Af Amer: >60 mL/min (ref >60)
GFR calc non Af Amer: >60 mL/min (ref >60)
GLUCOSE: 130 mg/dL — AB (ref 65–99)
POTASSIUM: 4 mmol/L (ref 3.5–5.1)
Sodium: 120 mmol/L — ABNORMAL LOW (ref 135–145)

## 2016-12-16 LAB — GLUCOSE, CAPILLARY
GLUCOSE-CAPILLARY: 126 mg/dL — AB (ref 65–99)
GLUCOSE-CAPILLARY: 124 mg/dL — AB (ref 65–99)
Glucose-Capillary: 131 mg/dL — ABNORMAL HIGH (ref 65–99)
Glucose-Capillary: 126 mg/dL — ABNORMAL HIGH (ref 65–99)
Glucose-Capillary: 122 mg/dL — ABNORMAL HIGH (ref 65–99)
Glucose-Capillary: 132 mg/dL — ABNORMAL HIGH (ref 65–99)

## 2016-12-16 LAB — CBC
HEMATOCRIT: 36.3 % — AB (ref 39.0–52.0)
Hemoglobin: 12.9 g/dL — ABNORMAL LOW (ref 13.0–17.0)
MCH: 28.6 pg (ref 26.0–34.0)
MCHC: 35.5 g/dL (ref 30.0–36.0)
MCV: 80.5 fL (ref 78.0–100.0)
PLATELETS: 244 10*3/uL (ref 150–400)
RBC: 4.51 MIL/uL (ref 4.22–5.81)
RDW: 16.9 % — AB (ref 11.5–15.5)
WBC: 11.9 10*3/uL — AB (ref 4.0–10.5)

## 2016-12-16 LAB — COMPREHENSIVE METABOLIC PANEL
ALBUMIN: 3.6 g/dL (ref 3.5–5.0)
ALT: 32 U/L (ref 17–63)
ANION GAP: 10 (ref 5–15)
AST: 33 U/L (ref 15–41)
Alkaline Phosphatase: 65 U/L (ref 38–126)
BUN: 15 mg/dL (ref 6–20)
CHLORIDE: 85 mmol/L — AB (ref 101–111)
CO2: 25 mmol/L (ref 22–32)
Calcium: 9.2 mg/dL (ref 8.9–10.3)
Creatinine, Ser: 0.76 mg/dL (ref 0.61–1.24)
GFR calc Af Amer: >60 mL/min (ref >60)
GFR calc non Af Amer: >60 mL/min (ref >60)
Glucose, Bld: 147 mg/dL — ABNORMAL HIGH (ref 65–99)
POTASSIUM: 4.5 mmol/L (ref 3.5–5.1)
Sodium: 120 mmol/L — ABNORMAL LOW (ref 135–145)
Total Bilirubin: 1.3 mg/dL — ABNORMAL HIGH (ref 0.3–1.2)
Total Protein: 6.9 g/dL (ref 6.5–8.1)

## 2016-12-16 LAB — PHOSPHORUS: Phosphorus: 3.5 mg/dL (ref 2.5–4.6)

## 2016-12-16 LAB — MAGNESIUM: Magnesium: 1.8 mg/dL (ref 1.7–2.4)

## 2016-12-16 LAB — OSMOLALITY, URINE: Osmolality, Ur: 452 mOsmol/kg

## 2016-12-16 MED ORDER — DEMECLOCYCLINE HCL 150 MG PO TABS
150.0000 mg | ORAL_TABLET | Freq: Two times a day (BID) | ORAL | Status: DC
  Administered 2016-12-16 – 2016-12-18 (×5): 150 mg via ORAL
  Filled 2016-12-16 (×7): qty 1

## 2016-12-16 MED ORDER — MAGNESIUM SULFATE 2 GM/50ML IV SOLN
2.0000 g | Freq: Once | INTRAVENOUS | Status: AC
  Administered 2016-12-16: 2 g via INTRAVENOUS
  Filled 2016-12-16: qty 50

## 2016-12-16 NOTE — Assessment & Plan Note (Signed)
Advised patient to start Senokot to help with his constipation. Certainly concerning with his description of melenic stools that a GI malignancy could be related to his constipation.
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
## 2016-12-16 NOTE — Assessment & Plan Note (Signed)
Patient has continued to have hyponatremia despite fluid restriction and increased salt intake. A repeat bmet showed Na 121 and urine osmolality obtained was >400. His hyponatremia is most likely secondary to SIADH but the cause for SIADH is still unknown. Originally thought to be due to medications but he is not on any medications currently that would cause SIADH. Given he is having melenic stools he could have a GI malignancy. Will have him see GI for endoscopy and colonoscopy as soon as possible and if not GI malignancy present, he may require referral to nephrology for further management.

## 2016-12-16 NOTE — Progress Notes (Addendum)
STAFF NOTE: I, Dr Ann Lions have personally reviewed patient's available data, including medical history, events of note, physical examination and test results as part of my evaluation. I have discussed with resident/NP and other care providers such as pharmacist, RN and RRT.  In addition,  I personally evaluated patient and elicited key findings of   S: no change. Per son he is some better/ Hungry and wants to eat. NSGY continues to recommend ICU obs due to risk for deterioration   has a past medical history of Anemia; Anxiety; Diabetes mellitus without complication ([place]); Diverticulosis of colon (without mention of hemorrhage); GERD (gastroesophageal reflux disease); Hypertension; and Traumatic brain injury ([place]) (remote).   O: similar to yesterday - comfortable, awake, partially oriented, follows commands moves all 4s. CTA bilaterally Abd soft   Recent Labs Lab 12/14/16 2329 12/15/16 0728 12/16/16 0220  HGB 14.6 12.8* 12.9*  HCT 40.3 36.4* 36.3*  WBC 16.9* 12.3* 11.9*  PLT 257 227 244    Recent Labs Lab 12/14/16 1050 12/14/16 2329 12/15/16 0728 12/16/16 0220  NA 121* 122* 120* 120*  K 4.8 4.7 4.7 4.5  CL 80* 88* 85* 85*  CO2 25 26 25 25   GLUCOSE 121* 125* 140* 147*  BUN 12 15 15 15   CREATININE 0.79 0.87 0.89 0.76  CALCIUM 9.6 9.2 8.8* 9.2  MG  --   --   --  1.8  PHOS  --   --   --  3.5    Recent Labs Lab 12/14/16 2329 12/15/16 0728 12/16/16 0220  AST 35  --  33  ALT 36  --  32  ALKPHOS 75  --  65  BILITOT 0.8  --  1.3*  PROT 8.4*  --  6.9  ALBUMIN 4.3  --  3.6  INR  --  1.01  --      1. The left temporal intraparenchymal hemorrhage measures slightly larger in 1 dimension today. Whether this is a tiny amount of true enlargement or simply due to difference in slice selection is unclear. Recommend attention on follow-up. 2. The left subdural hematoma measures 3 mm, unchanged. 3. 4 mm of left-to-right midline shift today versus 6 mm previously. 4. A small  amount of subarachnoid hemorrhage posteriorly in the right sylvian fissure may be secondary to the subarachnoid hemorrhage adjacent to the left temporal hemorrhage. Recommend attention on follow-up. 5. No other interval change. These results will be called to the ordering clinician or representative by the Radiologist Assistant, and communication documented in the PACS or zVision Dashboard.   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 So he was truly having dizzinees etc from his low Na  Low mag  At risk for resp failure -   P: continue ICU monitoring for resp failure Start demeclocycline 150mg  bid low dose  - needs LFT and bmet monitoring (no report of cirrhosis in pmx of 2012 ct abd)  - goal raise < 64meq in 24h  Low mag - replete  Son updated   .  Rest per NP/medical resident whose note is outlined above and that I agree with  The patient is critically ill with multiple organ systems failure and requires high complexity decision making for assessment and support, frequent evaluation and titration of therapies, application of advanced  monitoring technologies and extensive interpretation of multiple databases.   Critical Care Time devoted to patient care services described in this note is  30  Minutes. This time reflects time of care of this signee Dr Brand Males. This critical care time does not reflect procedure time, or teaching time or supervisory time of PA/NP/Med student/Med Resident etc but could involve care discussion time    Dr. Brand Males, M.D., [facility].C.P Pulmonary and Critical Care Medicine Staff Physician [place] [facility] Pager: [phone], If no answer or between  15:00h - 7:00h: call [phone]  12/16/2016 11:33  AM

## 2016-12-16 NOTE — Progress Notes (Signed)
Pt seen and examined. No issues overnight.  EXAM: Temp:  [97.6 F (36.4 C)-98.1 F (36.7 C)] 97.6 F (36.4 C) (04/29 0400) Pulse Rate:  [78-94] 82 (04/29 0300) Resp:  [11-24] 14 (04/29 0300) BP: (133-162)/(64-78) 144/67 (04/29 0300) SpO2:  [92 %-100 %] 98 % (04/29 0300) Intake/Output      04/28 0701 - 04/29 0700   P.O. 180   Other 60   Total Intake(mL/kg) 240 (3.4)   Urine (mL/kg/hr) 425 (0.3)   Total Output 425   Net -185        Awake and alert and oriented Follows commands throughout Strength symmetric  Stable Continue observation in ICU

## 2016-12-16 NOTE — Assessment & Plan Note (Signed)
Reports 3 melenic stools over the last month. Advised him to bring stool sample to his GI doctor as soon as possible so that he can undergo endo/colonoscopy. He will call the GI office to make an appointment.

## 2016-12-16 NOTE — Progress Notes (Addendum)
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 He was seen at his PCPs office yesterday for follow-up, however afterwards with walking to his mailbox he fell hit his head and brief loss of consciousness. This was witnessed by his neighbor who called EMS. In the ED he was noted to have multiple facial lacerations and hematemesis and a CT scan showed a subdural hematoma as well as a nondisplaced left orbital floor fracture. He otherwise was found by the ED to have intact neurological status. Neurosurgery was consulted and recommended admission with repeat CT scan in 12 hours frequent neuro checks and prophylactic Keppra. He was transferred from [facility] to [facility] after repeat CT scan this morning, shows the previous findings as well as a new 2 cm x 4 cm intraparenchymal hematoma with some vasogenic edema and mild mass effect. Clinically he was more lethargic but otherwise grossly oriented and following commands ( Through interpreter) Because of this change in his bleed, he will be monitored in the Neuro ICU, and CCM will assume care.   SUBJECTIVE:  Through interpreter, states he is feeling ok, About the same as yesterday. He cannot remember the day of the week, but knows it is the 4th month.Continue to complain of facial soreness. Eyes are less swollen today.Saturations are 97% on RA  VITAL SIGNS: BP 135/70   Pulse 88   Temp 97.9 F (36.6 C) (Oral)   Resp 13   Ht 5\' 5"  (1.651 m)   Wt 155 lb (70.3 kg)   SpO2 98%   BMI 25.79 kg/m   HEMODYNAMICS:    VENTILATOR SETTINGS:     INTAKE / OUTPUT: I/O last 3 completed shifts: In: 240 [P.O.:180; Other:60] Out: 480 [Urine:480]  PHYSICAL EXAMINATION: General:  Awake and alert elderly male, supine in bed, following commands, facial bruising Neuro:  Awake and alert, following commands, MAE x 4,Oriented x 3, confused as to date HEENT: Multiple facial lacerations and bruising, sutures over left eye intact. Left eye less swollen this am.,+ raccoon eyes Cardiovascular: RRR, S1, S2, No RMG Lungs:  Clear throughout, no nasal flaring, sternal retractions, regular rate, NAD Abdomen: Soft and flat, Non-distended, non-tender, BS + Musculoskeletal: No obvious deformities Skin: Intact, warm and dry, multiple bruises and abrasions from fall  LABS:  BMET  Recent Labs Lab 12/14/16 2329 12/15/16 0728 12/16/16 0220  NA 122* 120* 120*  K 4.7 4.7 4.5  CL 88* 85* 85*  CO2 26 25 25   BUN 15 15 15   CREATININE 0.87 0.89 0.76  GLUCOSE 125* 140* 147*    Electrolytes  Recent Labs Lab 12/14/16 2329 12/15/16 0728 12/16/16 0220  CALCIUM 9.2 8.8* 9.2  MG  --   --  1.8  PHOS  --   --  3.5    CBC  Recent Labs Lab 12/14/16 2329 12/15/16 0728 12/16/16 0220  WBC 16.9* 12.3* 11.9*  HGB 14.6 12.8* 12.9*  HCT 40.3 36.4* 36.3*  PLT 257 227 244    Coag's  Recent Labs Lab 12/15/16 0728  INR 1.01    Sepsis Markers No results for input(s): LATICACIDVEN, PROCALCITON, O2SATVEN in the last 168 hours.  ABG No results for input(s): PHART, PCO2ART, PO2ART in the last 168 hours.  Liver Enzymes  Recent Labs Lab 12/14/16 2329 12/16/16 0220  AST 35 33  ALT 36 32  ALKPHOS 75 65  BILITOT 0.8 1.3*  ALBUMIN 4.3 3.6    Cardiac Enzymes No results for input(s): TROPONINI, PROBNP in the last 168 hours.  Glucose  Recent Labs Lab 12/15/16 1133 12/15/16 1629 12/15/16 1918 12/15/16 2302 12/16/16 0334 12/16/16 0701  GLUCAP 145* 189* 149* 139* 126* 131*    Imaging Ct Head Wo Contrast  Result Date:  12/15/2016 CLINICAL DATA:  Follow-up bleed and midline shift. EXAM: CT HEAD WITHOUT CONTRAST TECHNIQUE: Contiguous axial images were obtained from the base of the skull through the vertex without intravenous contrast. COMPARISON:  December 15, 2016 FINDINGS: Brain: The left-sided subdural hematoma persists measuring up to 3 mm, unchanged. No new subdural hemorrhage. The hematoma within the left temporal lobe with surrounding vasogenic edema remains measuring 4.3 by 2.3 cm today versus 4.0 x 2.3 cm yesterday. There is a tiny amount of adjacent subarachnoid hemorrhage, also unchanged. There is a small amount of subarachnoid hemorrhage posteriorly in the right sylvian fissure best seen on coronal image 40 which was not seen previously. This may have migrated from the subarachnoid hemorrhage seen adjacent to the left temporal hematoma. No other new blood identified. There is mass effect on the left lateral ventricle which is smaller than the right lateral ventricle. There is near complete effacement of the temporal horn of the left lateral ventricle. The ventricles are unchanged. The third and fourth ventricles remain patent. 4 mm of left-to-right midline shift is seen today versus 6 mm yesterday. The basal cisterns remain patent. White matter changes are stable. No acute infarct or ischemia is identified. The cerebellum, and brainstem are unchanged unremarkable. Vascular: No hyperdense vessel or unexpected calcification. Skull: No bony changes are identified in the interval. Sinuses/Orbits: High attenuation material again fills the left maxillary sinus, unchanged. A small amount of fluid is seen in the right sphenoid sinus, unchanged. Paranasal sinuses, mastoid air cells, and middle ears are otherwise well aerated. Other: Soft tissue swelling over the right scalp and left periorbital region remains. The globes remain intact. IMPRESSION: 1. The left temporal intraparenchymal hemorrhage measures slightly larger in 1  dimension today. Whether this is a tiny amount of true enlargement or simply due to difference in slice selection is unclear. Recommend attention on follow-up. 2. The left subdural hematoma measures 3 mm, unchanged. 3. 4 mm of left-to-right midline shift today versus 6 mm previously. 4. A small amount of subarachnoid hemorrhage posteriorly in the right sylvian fissure may be secondary to the subarachnoid hemorrhage adjacent to the left temporal hemorrhage. Recommend attention on follow-up. 5. No other interval change. These results will be called to the ordering clinician or representative by the Radiologist Assistant, and communication documented in the PACS or zVision Dashboard. Electronically Signed   By: Dorise Bullion III M.D   On: 12/15/2016 16:26     STUDIES:  4/27>> CT Cervical Spine 4/27>> CT Head with contrast 4/28 >> CT Maxillofacial   CULTURES: MRSA>> 4/28  ANTIBIOTICS: None at present  SIGNIFICANT EVENTS: 4/27>> Fall/ SDH  LINES/TUBES: PIV  DISCUSSION: SDH with 3 mm midline shift and IP hematoma and vasogenic edema. This was a traumatic injury due to witnessed fall, suspect could be related to chronic hyponatremia. Mental status is intact upon exam ( Pt. Is non- English speaking, examination through interpreter who was also his  RN). Pt. Is high risk for decompensation and therefore was moved to ICU, where CCM has assumed care.   ASSESSMENT / PLAN:  PULMONARY A: No Acute Issues Saturations 100% on RA P:   Maintain saturations > 94% Monitor for any change in ability to protect airway. CXR prn Early intervention for airway protection if bleed progresses and pt decompensates  CARDIOVASCULAR A:  No acute issues Normotensive P:  Telemetry monitoring Maintain SBP < 140 Maintain MAP >65 Hold home lisinopril for now  RENAL A:   Continued Chronic Hyponatremia/SIADH Concerning in setting of ICH P:   PO Fluid restriction Consider low rate NS  Monitor Urine  output Replete electrolytes as needed May need hypertonic saline if concern for increased ICP.  GASTROINTESTINAL A:   Nausea and vomiting/ Hematemesis in ED 4/27  Abdomen is soft, non-distended Recent endo 10/2016>> esophogeal thickening P:   Follow up imaging per neuro: only if clinical neurological change Consider starting diet Monitor for further hematemesis SUP  HEMATOLOGIC A:   Leukocytosis>> down trending( ? Reactive) INR 1.01 4/28 P:  CBC daily Monitor Coags prn Transfuse for HGB > 7   INFECTIOUS A:   Leukocytosis down trending( Suspect reactive) No obvious source of infection Afebrile P:   Trend WBC and Fever curve Culture if clinically indicated ABX if clinically indicated  ENDOCRINE A:   DM A1C last year 5.9 Home Metformin  P:   Hold Metformin for now CBG's Q4 SSI  NEUROLOGIC A:   SDH with increase in size and shift 4/28 Repeat Imaging 4/28 pm : The left subdural hematoma measures 3 mm, unchanged.  4 mm of left-to-right midline shift today versus 6 mm previously noted in earlier scan.  P:   Management per Neurosurgery Continue in monitoring in ICU today Continue Keppra BID x 7 days Decadron 10 mg  x 1 Neuro checks Q1 Follow up CT if exam changes Consider Diet if passes swallow   RASS goal:0- 1    FAMILY  - Updates: Family updated at bedside 4/29   - Inter-disciplinary family meet or Palliative Care meeting due by: 5/4day   Magdalen Spatz, AGACNP-BC [place] Pager: [phone]  12/16/2016, 9:29 AM

## 2016-12-17 DIAGNOSIS — R1310 Dysphagia, unspecified: Secondary | ICD-10-CM

## 2016-12-17 DIAGNOSIS — I10 Essential (primary) hypertension: Secondary | ICD-10-CM

## 2016-12-17 LAB — CBC
HEMATOCRIT: 36.9 % — AB (ref 39.0–52.0)
Hemoglobin: 13.2 g/dL (ref 13.0–17.0)
MCH: 28.8 pg (ref 26.0–34.0)
MCHC: 35.8 g/dL (ref 30.0–36.0)
MCV: 80.4 fL (ref 78.0–100.0)
PLATELETS: 256 10*3/uL (ref 150–400)
RBC: 4.59 MIL/uL (ref 4.22–5.81)
RDW: 17.7 % — ABNORMAL HIGH (ref 11.5–15.5)
WBC: 15.1 10*3/uL — AB (ref 4.0–10.5)

## 2016-12-17 LAB — GLUCOSE, CAPILLARY
GLUCOSE-CAPILLARY: 146 mg/dL — AB (ref 65–99)
GLUCOSE-CAPILLARY: 137 mg/dL — AB (ref 65–99)
GLUCOSE-CAPILLARY: 132 mg/dL — AB (ref 65–99)
Glucose-Capillary: 120 mg/dL — ABNORMAL HIGH (ref 65–99)
Glucose-Capillary: 151 mg/dL — ABNORMAL HIGH (ref 65–99)

## 2016-12-17 LAB — PHOSPHORUS: Phosphorus: 3.1 mg/dL (ref 2.5–4.6)

## 2016-12-17 LAB — BASIC METABOLIC PANEL
ANION GAP: 10 (ref 5–15)
BUN: 16 mg/dL (ref 6–20)
CO2: 24 mmol/L (ref 22–32)
Calcium: 9 mg/dL (ref 8.9–10.3)
Chloride: 87 mmol/L — ABNORMAL LOW (ref 101–111)
Creatinine, Ser: 0.83 mg/dL (ref 0.61–1.24)
GFR calc Af Amer: >60 mL/min (ref >60)
GLUCOSE: 149 mg/dL — AB (ref 65–99)
POTASSIUM: 4 mmol/L (ref 3.5–5.1)
Sodium: 121 mmol/L — ABNORMAL LOW (ref 135–145)

## 2016-12-17 LAB — HEPATIC FUNCTION PANEL
ALT: 34 U/L (ref 17–63)
AST: 32 U/L (ref 15–41)
Albumin: 3.7 g/dL (ref 3.5–5.0)
Alkaline Phosphatase: 62 U/L (ref 38–126)
BILIRUBIN DIRECT: 0.3 mg/dL (ref 0.1–0.5)
BILIRUBIN INDIRECT: 1 mg/dL — AB (ref 0.3–0.9)
TOTAL PROTEIN: 7.1 g/dL (ref 6.5–8.1)
Total Bilirubin: 1.3 mg/dL — ABNORMAL HIGH (ref 0.3–1.2)

## 2016-12-17 LAB — MAGNESIUM: Magnesium: 2.1 mg/dL (ref 1.7–2.4)

## 2016-12-17 NOTE — Progress Notes (Signed)
New Admission Note:  Arrival Method: wheelchair  Mental Orientation: alert to self, location & situation Telemetry: 62m19 Assessment: Completed Skin: See flowsheet:  IV:L hand /saline locked Pain:0/10 Tubes: Safety Measures: Safety Fall Prevention Plan was given, discussed Admission: Completed 5M19: Patient has been orientated to the room, unit and the staff. Family: visiting   Orders have been reviewed and implemented. Will continue to monitor the patient. Call light has been placed within reach and bed alarm has been activated.   Arta Silence ,RN

## 2016-12-17 NOTE — Progress Notes (Signed)
Pt seen and examined. No issues overnight.  EXAM: Temp:  [97.7 F (36.5 C)-98.1 F (36.7 C)] 97.7 F (36.5 C) (04/30 0400) Pulse Rate:  [70-104] 88 (04/30 0700) Resp:  [11-24] 13 (04/30 0700) BP: (126-164)/(48-77) 143/60 (04/30 0700) SpO2:  [93 %-98 %] 98 % (04/30 0700) Intake/Output      04/29 0701 - 04/30 0700 04/30 0701 - 05/01 0700   P.O.     Other 230    IV Piggyback 50    Total Intake(mL/kg) 280 (4)    Urine (mL/kg/hr) 850 (0.5)    Total Output 850     Net -570          Urine Occurrence 1 x     Awake and oriented, more alert than yesterday Follows commands throughout Full strength  Stable No role for surgery Will sign off Please call with questions

## 2016-12-17 NOTE — Progress Notes (Signed)
Internal Medicine Clinic Attending  Case discussed with Dr. Rivet at the time of the visit.  We reviewed the resident's history and exam and pertinent patient test results.  I agree with the assessment, diagnosis, and plan of care documented in the resident's note.  

## 2016-12-17 NOTE — Care Management Note (Signed)
Case Management Note  Patient Details  Name: [name] MRN: [id number] Date of Birth: May 05, 1946  Subjective/Objective:   Patient admitted with SDH after at fall at home. Pt is from home alone.                  Action/Plan: PT recommending [place] services. CM following for d/c needs, physician orders.   Expected Discharge Date:                  Expected Discharge Plan:     In-House Referral:     Discharge planning Services     Post Acute Care Choice:    Choice offered to:     DME Arranged:    DME Agency:     HH Arranged:    HH Agency:     Status of Service:  In process, will continue to follow  If discussed at Long Length of Stay Meetings, dates discussed:    Additional Comments:  Pollie Friar, RN 12/17/2016, 3:14 PM

## 2016-12-17 NOTE — Progress Notes (Addendum)
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 He was seen at his PCPs office yesterday for follow-up, however afterwards with walking to his mailbox he fell hit his head and brief loss of consciousness. This was witnessed by his neighbor who called EMS. In the ED he was noted to have multiple facial lacerations and hematemesis and a CT scan showed a subdural hematoma as well as a nondisplaced left orbital floor fracture. He otherwise was found by the ED to have intact neurological status. Neurosurgery was consulted and recommended admission with repeat CT scan in 12 hours frequent neuro checks and prophylactic Keppra. He was transferred from [facility] to [facility] after repeat CT scan this morning, shows the previous findings as well as a new 2 cm x 4 cm intraparenchymal hematoma with some vasogenic edema and mild mass effect. Clinically he was more lethargic but otherwise grossly oriented and following commands ( Through interpreter) Because of this change in his bleed, he will be monitored in the Neuro ICU, and CCM will assume care.   SUBJECTIVE:  No events overnight, asking if able to get up and walk.  VITAL SIGNS: BP (!) 142/65   Pulse (!) 123   Temp 98 F (36.7 C) (Axillary)   Resp (!) 21   Ht 5\' 5"  (1.651 m)   Wt 70.3 kg (155 lb)   SpO2 97%   BMI 25.79 kg/m   HEMODYNAMICS:    VENTILATOR SETTINGS:    INTAKE / OUTPUT: I/O last 3 completed shifts: In: 39 [P.O.:180; Other:280; IV Piggyback:50] Out: 1275 [VXBLT:9030]  PHYSICAL EXAMINATION: General:  Awake and  alert elderly male, facial bruising, alert and interactive through interpreter Neuro:  Awake and alert, moving all ext to command HEENT: Multiple facial lacerations and bruising, sutures over left eye intact. Left eye less swollen this am.,+ raccoon eyes Cardiovascular: RRR, S1, S2, No RMG Lungs:  Clear throughout, no nasal flaring, sternal retractions, regular rate, NAD Abdomen: Soft and flat, Non-distended, non-tender, BS + Musculoskeletal: No obvious deformities Skin: Intact, warm and dry, multiple bruises and abrasions from fall  LABS:  BMET  Recent Labs Lab 12/16/16 0220 12/16/16 2012 12/17/16 0236  NA 120* 120* 121*  K 4.5 4.0 4.0  CL 85* 84* 87*  CO2 25 23 24   BUN 15 15 16   CREATININE 0.76 0.81 0.83  GLUCOSE 147* 130* 149*   Electrolytes  Recent Labs Lab 12/16/16 0220 12/16/16 2012 12/17/16 0236  CALCIUM 9.2 8.9 9.0  MG 1.8  --  2.1  PHOS 3.5  --  3.1   CBC  Recent Labs Lab 12/15/16 0728 12/16/16 0220 12/17/16 0236  WBC 12.3* 11.9* 15.1*  HGB 12.8* 12.9* 13.2  HCT 36.4* 36.3* 36.9*  PLT 227 244 256   Coag's  Recent Labs Lab 12/15/16 0728  INR 1.01   Sepsis Markers No results for input(s): LATICACIDVEN, PROCALCITON, O2SATVEN in the last 168 hours.  ABG No results for input(s): PHART, PCO2ART, PO2ART in the last 168 hours.  Liver Enzymes  Recent Labs Lab 12/14/16 2329 12/16/16 0220 12/17/16 0236  AST 35 33 32  ALT 36 32 34  ALKPHOS  75 65 62  BILITOT 0.8 1.3* 1.3*  ALBUMIN 4.3 3.6 3.7   Cardiac Enzymes No results for input(s): TROPONINI, PROBNP in the last 168 hours.  Glucose  Recent Labs Lab 12/16/16 1200 12/16/16 1644 12/16/16 1915 12/16/16 2309 12/17/16 0304 12/17/16 0819  GLUCAP 126* 124* 122* 132* 146* 120*   Imaging I reviewed head CT myself, left sided intraparenchymal hemorrhage noted.  STUDIES:  4/27>> CT Cervical Spine 4/27>> CT Head with contrast 4/28 >> CT Maxillofacial  CULTURES: MRSA>>  4/28  ANTIBIOTICS: None at present  SIGNIFICANT EVENTS: 4/27>> Fall/ SDH  LINES/TUBES: PIV  DISCUSSION: SDH with 3 mm midline shift and IP hematoma and vasogenic edema. This was a traumatic injury due to witnessed fall, suspect could be related to chronic hyponatremia. Mental status is intact upon exam ( Pt. 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 Pt. Is high risk for decompensation and therefore was moved to ICU, where CCM has assumed care.   ASSESSMENT / PLAN:  PULMONARY A: No Acute Issues Saturations 100% on RA P:   Maintain saturations > 94% Monitor for any change in ability to protect airway.  CARDIOVASCULAR A:  No acute issues Normotensive P:  Telemetry monitoring Maintain SBP < 140 Hold home lisinopril for now  RENAL A:   Continued Chronic Hyponatremia/SIADH Concerning in setting of ICH P:   PO Fluid restriction KVO IVF Monitor Urine output Replete electrolytes as needed  GASTROINTESTINAL A:   Nausea and vomiting/ Hematemesis in ED 4/27  Abdomen is soft, non-distended Recent endo 10/2016>> esophogeal thickening P:   Follow up imaging per neuro: only if clinical neurological change Diet Monitor for further hematemesis SUP ?SLP  HEMATOLOGIC A:   Leukocytosis>> down trending( ? Reactive) INR 1.01 4/28 P:  CBC daily Monitor Coags prn Transfuse for HGB > 7  INFECTIOUS A:   Leukocytosis down trending( Suspect reactive) No obvious source of infection Afebrile P:   Trend WBC and Fever curve Culture if clinically indicated ABX if clinically indicated  ENDOCRINE A:   DM A1C last year 5.9 Home Metformin  P:   Hold Metformin for now CBG's Q4 SSI  NEUROLOGIC A:   SDH with increase in size and shift 4/28 Repeat Imaging 4/28 pm : The left subdural hematoma measures 3 mm, unchanged.  4 mm of left-to-right midline shift today versus 6 mm previously noted in earlier scan.  P:   Management per  Neurosurgery - no surgical interventions and signed off. Transfer to tele since no change for days and neurosurgery signed off. Continue Keppra BID x 7 days. Decadron 10 mg  x 1, hold further. Neuro checks Q1. Follow up CT if exam changes. PT evaluation OOB to chair SLP  Discussed with IMTS, transfer to tele and to IMTS service with PCCM off 5/1.  Rush Farmer, M.D. [facility] Pulmonary/Critical Care Medicine. Pager: [phone]. After hours pager: [phone].  12/17/2016, 10:21 AM

## 2016-12-17 NOTE — Progress Notes (Signed)
Patient transferred to 5M19 and was moved to the bed with one assist from the wheelchair. Dietary was called to bring him his tray in his new room. MD made aware of pt's tachycardia before he was transferred. Jonalyn Sedlak, Rande Brunt, RN

## 2016-12-17 NOTE — Progress Notes (Signed)
eLink Physician-Brief Progress Note Patient Name: [name] DOB: 02-27-1946 MRN: [id number]   Date of Service  12/17/2016  HPI/Events of Note  c/o abdominal pain.  Bladder scan with > 800 ml urine.    eICU Interventions  In/out cath and monitor.     Intervention Category Major Interventions: Other:  Juandedios Dudash 12/17/2016, 1:27 AM

## 2016-12-17 NOTE — Evaluation (Signed)
Physical Therapy Evaluation Patient Details Name: [name] MRN: [id number] DOB: 06-09-1946 Today's Date: 12/17/2016   History of Present Illness  71 y.o. male admitted for fall and brief LOC with history of SIADH,  chronic hyponatremia,DM2, HTN, diverticulosis, anemia and depression   Clinical Impression  Pt presents to PT with decreased tolerance for functional mobility due to above diagnosis and recent fall.  He required +2 min assist to walk from the bed to chair and his HR reached 140's while ambulating.  PTA, he lived alone was independent with mobility and self care. His granddaughter was present this session and stated that several family members and a neighbor check on him regularly, and his family is in the process of planning to provide him with 24 hour support upon D/C home.  He will benefit from continued skilled therapy and 24/7 support to maximize return to PLOF and hopefully prevent future falls.  If family unable to provide 24 hour care, may need to consider post-acute rehab. Will follow acutely    Follow Up Recommendations Home health PT;Supervision/Assistance - 24 hour    Equipment Recommendations  Other (comment) (TBD)    Recommendations for Other Services       Precautions / Restrictions Precautions Precautions: Fall Restrictions Weight Bearing Restrictions: No      Mobility  Bed Mobility Overal bed mobility: Modified Independent             General bed mobility comments: Pt required cues and extra time to scoot to EOB and place both feet on floor.  Transfers Overall transfer level: Needs assistance Equipment used: 2 person hand held assist Transfers: Sit to/from Stand Sit to Stand: Min assist;+2 physical assistance         General transfer comment: Pt states he does not get up unless he has anoter person present to help.    Ambulation/Gait Ambulation/Gait assistance: Min assist;+2 physical assistance Ambulation Distance (Feet): 5  Feet Assistive device: 2 person hand held assist Gait Pattern/deviations: Step-through pattern;Decreased stride length;Trunk flexed;Narrow base of support Gait velocity: decreased Gait velocity interpretation: Below normal speed for age/gender General Gait Details: Pt reliant on +2 hand held assist for steadying.  Pt walked from bed to transport chair in room so RN could move him to other unit (45M).  Pt became tachycardic with ambulation (HR 140s).  Stairs            Wheelchair Mobility    Modified Rankin (Stroke Patients Only)       Balance Overall balance assessment: Needs assistance Sitting-balance support: No upper extremity supported;Feet supported Sitting balance-Leahy Scale: Fair     Standing balance support: Bilateral upper extremity supported Standing balance-Leahy Scale: Poor                               Pertinent Vitals/Pain Pain Assessment: 0-10 Pain Score: 2  Pain Location: head Pain Descriptors / Indicators: Headache Pain Intervention(s): Limited activity within patient's tolerance;Monitored during session    Home Living Family/patient expects to be discharged to:: Private residence Living Arrangements: Alone Available Help at Discharge: Family;Neighbor;Available PRN/intermittently Type of Home: Apartment Home Access: Level entry;Ramped entrance     Home Layout: One level Home Equipment: Grab bars - toilet;Grab bars - tub/shower;Shower seat      Prior Function Level of Independence: Independent         Comments: Granddaughter present this session and states that she, his son, and his neighbor check on  him intermittently.     Hand Dominance   Dominant Hand: Right    Extremity/Trunk Assessment   Upper Extremity Assessment Upper Extremity Assessment: Defer to OT evaluation    Lower Extremity Assessment Lower Extremity Assessment: Generalized weakness       Communication   Communication: Prefers language other than  [place] (Spanish speaking)  Cognition Arousal/Alertness: Awake/alert Behavior During Therapy: WFL for tasks assessed/performed Overall Cognitive Status: Difficult to assess                                        General Comments General comments (skin integrity, edema, etc.): Pt with ~3 x 3 cm bump on R upper forehead.  R eye very red and pt states his L eye has slightly blurry vision.  Pt states he has felt weaker since the fall.    Exercises     Assessment/Plan    PT Assessment Patient needs continued PT services  PT Problem List Decreased strength;Decreased range of motion;Decreased activity tolerance;Decreased balance;Decreased mobility;Decreased coordination;Decreased knowledge of use of DME;Decreased safety awareness;Decreased knowledge of precautions;Pain       PT Treatment Interventions DME instruction;Gait training;Functional mobility training;Therapeutic activities;Therapeutic exercise;Balance training;Patient/family education    PT Goals (Current goals can be found in the Care Plan section)  Acute Rehab PT Goals Patient Stated Goal: pt did not state a goal Time For Goal Achievement: 12/24/16 Potential to Achieve Goals: Good    Frequency Min 4X/week   Barriers to discharge Decreased caregiver support Pt's granddaughter states his family members are in the process of discussing plans for pt to have 24/7 support at home.    Co-evaluation               AM-PAC PT "6 Clicks" Daily Activity  Outcome Measure Difficulty turning over in bed (including adjusting bedclothes, sheets and blankets)?: Total Difficulty moving from lying on back to sitting on the side of the bed? : Total Difficulty sitting down on and standing up from a chair with arms (e.g., wheelchair, bedside commode, etc,.)?: Total Help needed moving to and from a bed to chair (including a wheelchair)?: A Little Help needed walking in hospital room?: A Little Help needed climbing 3-5  steps with a railing? : A Lot 6 Click Score: 11    End of Session Equipment Utilized During Treatment: Gait belt Activity Tolerance: Treatment limited secondary to medical complications (Comment) (tachycardic) Patient left: Other (comment) (with RN in transport chair) Nurse Communication: Mobility status PT Visit Diagnosis: Unsteadiness on feet (R26.81);Repeated falls (R29.6);Muscle weakness (generalized) (M62.81)    Time: 7353-2992 PT Time Calculation (min) (ACUTE ONLY): 15 min   Charges:   PT Evaluation $PT Eval Moderate Complexity: 1 Procedure     PT G Codes:        Gaetano Net SPT  Gaetano Net 12/17/2016, 3:23 PM
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
## 2016-12-18 ENCOUNTER — Inpatient Hospital Stay (HOSPITAL_COMMUNITY): Payer: Medicare Other

## 2016-12-18 DIAGNOSIS — R1084 Generalized abdominal pain: Secondary | ICD-10-CM

## 2016-12-18 DIAGNOSIS — S065X9A Traumatic subdural hemorrhage with loss of consciousness of unspecified duration, initial encounter: Principal | ICD-10-CM

## 2016-12-18 DIAGNOSIS — R110 Nausea: Secondary | ICD-10-CM

## 2016-12-18 LAB — GLUCOSE, CAPILLARY
GLUCOSE-CAPILLARY: 123 mg/dL — AB (ref 65–99)
GLUCOSE-CAPILLARY: 158 mg/dL — AB (ref 65–99)
Glucose-Capillary: 152 mg/dL — ABNORMAL HIGH (ref 65–99)
Glucose-Capillary: 141 mg/dL — ABNORMAL HIGH (ref 65–99)

## 2016-12-18 LAB — BASIC METABOLIC PANEL
ANION GAP: 8 (ref 5–15)
BUN: 13 mg/dL (ref 6–20)
CALCIUM: 8.6 mg/dL — AB (ref 8.9–10.3)
CO2: 25 mmol/L (ref 22–32)
Chloride: 90 mmol/L — ABNORMAL LOW (ref 101–111)
Creatinine, Ser: 0.64 mg/dL (ref 0.61–1.24)
Glucose, Bld: 117 mg/dL — ABNORMAL HIGH (ref 65–99)
POTASSIUM: 3.8 mmol/L (ref 3.5–5.1)
Sodium: 123 mmol/L — ABNORMAL LOW (ref 135–145)

## 2016-12-18 LAB — CBC
HCT: 33.1 % — ABNORMAL LOW (ref 39.0–52.0)
HEMOGLOBIN: 11.6 g/dL — AB (ref 13.0–17.0)
MCH: 28.4 pg (ref 26.0–34.0)
MCHC: 35 g/dL (ref 30.0–36.0)
MCV: 81.1 fL (ref 78.0–100.0)
PLATELETS: 206 10*3/uL (ref 150–400)
RBC: 4.08 MIL/uL — AB (ref 4.22–5.81)
RDW: 18.3 % — ABNORMAL HIGH (ref 11.5–15.5)
WBC: 11.1 10*3/uL — AB (ref 4.0–10.5)

## 2016-12-18 LAB — PHOSPHORUS: Phosphorus: 3.1 mg/dL (ref 2.5–4.6)

## 2016-12-18 LAB — MAGNESIUM: MAGNESIUM: 2 mg/dL (ref 1.7–2.4)

## 2016-12-18 MED ORDER — PROMETHAZINE HCL 25 MG/ML IJ SOLN
12.5000 mg | Freq: Four times a day (QID) | INTRAMUSCULAR | Status: DC | PRN
  Administered 2016-12-18: 12.5 mg via INTRAVENOUS
  Filled 2016-12-18: qty 1

## 2016-12-18 MED ORDER — PROMETHAZINE HCL 25 MG PO TABS: 25.0000 mg | ORAL_TABLET | Freq: Four times a day (QID) | ORAL | Status: DC | PRN

## 2016-12-18 MED ORDER — AMLODIPINE BESYLATE 5 MG PO TABS
5.0000 mg | ORAL_TABLET | Freq: Every day | ORAL | Status: DC
  Administered 2016-12-18 – 2016-12-19 (×2): 5 mg via ORAL
  Filled 2016-12-18 (×2): qty 1

## 2016-12-18 MED ORDER — HYDRALAZINE HCL 20 MG/ML IJ SOLN
10.0000 mg | INTRAMUSCULAR | Status: DC | PRN
  Administered 2016-12-19: 10 mg via INTRAVENOUS
  Filled 2016-12-18 (×2): qty 1

## 2016-12-18 MED ORDER — PROMETHAZINE HCL 25 MG/ML IJ SOLN: 12.5000 mg | Freq: Four times a day (QID) | INTRAMUSCULAR | Status: DC | PRN

## 2016-12-18 MED ORDER — POLYETHYLENE GLYCOL 3350 17 G PO PACK
17.0000 g | PACK | Freq: Every day | ORAL | Status: DC
  Administered 2016-12-18 – 2016-12-20 (×3): 17 g via ORAL
  Filled 2016-12-18 (×3): qty 1

## 2016-12-18 MED ORDER — SENNA 8.6 MG PO TABS
2.0000 | ORAL_TABLET | Freq: Every day | ORAL | Status: DC
  Administered 2016-12-18 – 2016-12-20 (×3): 17.2 mg via ORAL
  Filled 2016-12-18 (×3): qty 2

## 2016-12-18 MED ORDER — LEVETIRACETAM 500 MG/5ML IV SOLN
500.0000 mg | Freq: Two times a day (BID) | INTRAVENOUS | Status: DC
  Administered 2016-12-18 – 2016-12-23 (×11): 500 mg via INTRAVENOUS
  Filled 2016-12-18 (×11): qty 5

## 2016-12-18 MED ORDER — HYDRALAZINE HCL 20 MG/ML IJ SOLN
5.0000 mg | Freq: Once | INTRAMUSCULAR | Status: AC
  Administered 2016-12-18: 5 mg via INTRAVENOUS

## 2016-12-18 MED ORDER — SENNOSIDES-DOCUSATE SODIUM 8.6-50 MG PO TABS
1.0000 | ORAL_TABLET | Freq: Two times a day (BID) | ORAL | Status: DC
  Administered 2016-12-18: 1 via ORAL
  Filled 2016-12-18: qty 1

## 2016-12-18 NOTE — Progress Notes (Signed)
Dr. Randell Patient notified of patient's bp >140. PRN hydralazine given but does not bring patient's to desired parameter <006 systolic. Per MD, recheck BP and administer if bp >140.

## 2016-12-18 NOTE — Progress Notes (Signed)
   Transfer Note  Interval History:  71 year old man with history of chronic hyponatremia secondary to SIADH, CAD, DM, hypertension, diverticulosis, and depression presented on 4/27 after fall with LOC found to have subdural hematoma and nondisplaced left orbital floor fracture on CT head. Neurosurgery consulted. Started on prophylactic Keppra. Following transfer to [facility] from [facility], repeat CT scan showed new 2cm x 4cm intraparenchymal hematoma with vasogenic edema and mild mass effect. He was transferred to the Neuro ICU.  4/28: Received Decadron 10mg  IV x 1. CT head in afternoon with left temporal intraparenchymal hemorrhage slightly larger in one dimension, left subdural unchanged. 53mm shift versus 73mm previously.  4/29: Started on demeclocycline 150mg  BID for his SIADH.   4/30: Urinary retention at 1:30AM - had in/out cath. Neurosurgery signed off. Seen by SLP and recommend regular/thin liquid diet. Seen by PT and recommending HH PT and 24 hr supervision. Transferred from ICU to telemetry  Subjective: Interpretation assisted by granddaughter. He has a headache this morning but states that it is improved from prior. He has abdominal pain that started this morning. It is periumbilical. Never had similar abdominal pain before. His family reports he has not had a BM since admission, but RN reports one was recorded yesterday. He has nausea but no vomiting.   Objective:  Vital signs in last 24 hours: Vitals:   12/17/16 1837 12/17/16 2156 12/18/16 0100 12/18/16 0516  BP: (!) 147/59 (!) 142/68 133/66 (!) 148/69  Pulse:  98 98 (!) 102  Resp:  16 16 16   Temp:  99.1 F (37.3 C) 98.9 F (37.2 C) 99 F (37.2 C)  TempSrc:  Oral Oral Oral  SpO2:  96% 98% 97%  Weight:      Height:       General Apperance: Mild distress from abdominal pain HEENT: Normocephalic, ecchymosis around eyes and along bilateral neck, anicteric sclera Neck: Supple, trachea midline Lungs: Clear to auscultation  bilaterally. No wheezes, rhonchi or rales. Breathing comfortably on room air Heart: Regular rate and rhythm, no murmur/rub/gallop Abdomen: Soft, tender to palpation throughout, mildly distended Extremities: Warm and well perfused, no edema Skin: Ecchymosis on left upper arm Neurologic: Alert and interactive. No gross deficits.  Assessment/Plan: 71 year old man with history of chronic hyponatremia secondary to SIADH, CAD, DM, hypertension, diverticulosis, and depression presented on 4/27 after fall with LOC found to have subdural hematoma and nondisplaced left orbital floor fracture on CT head.  Abdominal pain: Started today. Has nausea. Tender to palpation throughout and tympanic to percussion. Denies recent BM. LFTs yesterday with total bilirubin 1.3 (indirect elevation). -XR abdomen -Zofran prn -Senokot-S BID -Still having urinary retention. Foley placed.  SDH: Reactive leukocytosis improving down to 11.1 from 15.1. Hgb 11.6 from 13.2 yesterday. -Continue Keppra BID x 7 days. -Neuro checks -Follow up CT if exam changes -PT evaluation -Norco 5/325mg  q4hr prn severe pain, Tylenol prn mild pain  Hemetemesis: Noted in ED. None since.   HTN: -Maintain SBP < 140 -Start amlodipine 5mg  daily -Hold home lisinopril -Hydralazine 5mg  q4hr prn elevated BP  Chronic Hyponatremia/SIADH: Base around 125-129. 121 on admission. Now up to 123. -PO Fluid restriction -Demeclocycline 150mg  BID started 4/29  DM2: Hgb A1c 6.1 on 09/25/2016 -Hold home Metformin -CBG QAC/HS -SSI -Check A1c  VTE ppx: SCDs Diet: Carb mod, 1227ml fluid restriction Code: FULL  Dispo: Anticipated discharge in approximately 1-2 day(s).   Milagros Loll, MD 12/18/2016, 7:43 AM Pager: [phone]

## 2016-12-18 NOTE — Progress Notes (Signed)
Physical Therapy Treatment Patient Details Name: [name] MRN: [id number] DOB: [birth year] Today's Date: 12/18/2016    History of Present Illness 71 y.o. male admitted for fall and brief LOC with history of SIADH,  chronic hyponatremia,DM2, HTN, diverticulosis, anemia and depression     PT Comments    This session was limited due to pt reporting 10/10 abdominal pain and vomiting.  Medical Spanish interpreter utilized.  No family member present to determine if pt will have 24/7 assist at home, so Discharge Plan will potentially need updating next session.  Pt ambulated 10 ft with RW around bed to chair requiring min guard to min assist.  He will benefit from continued skilled therapy in order to maximize return to PLOF.  Will follow acutely.    Follow Up Recommendations  Home health PT;Supervision/Assistance - 24 hour     Equipment Recommendations   (TBD)    Recommendations for Other Services       Precautions / Restrictions Precautions Precautions: Fall Restrictions Weight Bearing Restrictions: No    Mobility  Bed Mobility Overal bed mobility: Modified Independent             General bed mobility comments: Pt required cues and extra time to scoot to EOB and place both feet on floor.  Transfers Overall transfer level: Needs assistance Equipment used: Rolling walker (2 wheeled) Transfers: Sit to/from Stand Sit to Stand: Min assist         General transfer comment: Min assist due to pain  Ambulation/Gait Ambulation/Gait assistance: Min assist;Min guard Ambulation Distance (Feet): 10 Feet (around bed to chair) Assistive device: Rolling walker (2 wheeled) Gait Pattern/deviations: Step-through pattern;Decreased stride length;Trunk flexed;Narrow base of support Gait velocity: decreased Gait velocity interpretation: Below normal speed for age/gender General Gait Details: Initially pt stated he will fall if he uses the cane, so pt given RW.  Pt cued for  walker proximity but unable to adjust.  Pt appeared in distress from stomach pain and nausea.   Stairs            Wheelchair Mobility    Modified Rankin (Stroke Patients Only)       Balance Overall balance assessment: Needs assistance Sitting-balance support: No upper extremity supported;Feet supported Sitting balance-Leahy Scale: Fair     Standing balance support: Bilateral upper extremity supported;During functional activity Standing balance-Leahy Scale: Poor Standing balance comment: reliant on RW                            Cognition Arousal/Alertness: Awake/alert Behavior During Therapy: Restless Overall Cognitive Status: Difficult to assess                                 General Comments: Pt able to follow commands and problem solve well. Limited by pain this session so unable to fully assess.      Exercises      General Comments        Pertinent Vitals/Pain Pain Assessment: 0-10 Pain Score: 10-Worst pain ever Pain Location: abdomen Pain Descriptors / Indicators: Grimacing;Moaning;Constant;Restless Pain Intervention(s): Limited activity within patient's tolerance;Monitored during session;Repositioned;RN gave pain meds during session;Patient requesting pain meds-RN notified (RN gave pain meds at end of session)    Home Living Family/patient expects to be discharged to:: Private residence Living Arrangements: Alone Available Help at Discharge: Family;Neighbor;Available PRN/intermittently (granddaughter coming to stay with him) Type of Home: [place]  Access: Level entry;Ramped entrance   Home Layout: One level Home Equipment: Grab bars - toilet;Grab bars - tub/shower;Shower seat      Prior Function Level of Independence: Independent      Comments: Family and neighbor regularly check in on pt.   PT Goals (current goals can now be found in the care plan section) Acute Rehab PT Goals Patient Stated Goal: to feel  better Time For Goal Achievement: 12/24/16 Potential to Achieve Goals: Fair Progress towards PT goals: Progressing toward goals    Frequency    Min 4X/week      PT Plan Current plan remains appropriate    Co-evaluation              AM-PAC PT "6 Clicks" Daily Activity  Outcome Measure  Difficulty turning over in bed (including adjusting bedclothes, sheets and blankets)?: Total Difficulty moving from lying on back to sitting on the side of the bed? : Total Difficulty sitting down on and standing up from a chair with arms (e.g., wheelchair, bedside commode, etc,.)?: Total Help needed moving to and from a bed to chair (including a wheelchair)?: A Little Help needed walking in hospital room?: A Little Help needed climbing 3-5 steps with a railing? : A Lot 6 Click Score: 11    End of Session Equipment Utilized During Treatment: Gait belt Activity Tolerance: Patient limited by pain Patient left: in chair;with chair alarm set (with OT and interpreter) Nurse Communication: Other (comment) (Pt requesting for something to help him vomit.) PT Visit Diagnosis: Unsteadiness on feet (R26.81);Repeated falls (R29.6);Muscle weakness (generalized) (M62.81)     Time: 0045-9977 PT Time Calculation (min) (ACUTE ONLY): 18 min  Charges:  $Gait Training: 8-22 mins                    G Codes:       Gaetano Net SPT   Gaetano Net 12/18/2016, 3:32 PM
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
## 2016-12-18 NOTE — Plan of Care (Signed)
Problem: Education: Goal: Knowledge of [facility] Education information/materials will improve Outcome: Progressing POC reviewed with pt. and family; pt. doesn't speak English, and some family members do; tried Veterinary surgeon phones earlier with a lot of static, and unable to use; had [facility] staff  [place], NT to interpret, and family some.

## 2016-12-18 NOTE — Progress Notes (Signed)
Patient c/o nausea, given zofran per MAR. Patient vomited small amount of yellow emesis, MD notified, patient given phenergan. Patient has not voided this AM, bladder scan showed >400cc retained. Foley inserted w assist from [place], 300cc urine drained. Patient states his abdomen is 10/10 pain, MD aware, waiting for scan per order. Will continue to monitor.

## 2016-12-18 NOTE — Progress Notes (Signed)
Patient assisted to chair with rn assist. Stated he did not want to walk at this time. Tolerated well

## 2016-12-18 NOTE — Progress Notes (Signed)
Tried interpreter line [phone] and [phone]) and was disconnected; a lot static in the line.

## 2016-12-18 NOTE — Evaluation (Signed)
Occupational Therapy Evaluation Patient Details Name: [name] MRN: [id number] DOB: 1946/04/02 Today's Date: 12/18/2016    History of Present Illness 71 y.o. male admitted for fall and brief LOC with history of SIADH,  chronic hyponatremia,DM2, HTN, diverticulosis, anemia and depression    Clinical Impression   PTA, pt was independent with ADL and functional mobility. He currently is limited by significant 10/10 abdominal pain. He currently requires min assist for toilet transfers and LB ADL. Pt would benefit from continued OT services while admitted to improve independence with ADL and functional mobility. He was able to follow commands well during session today. He lives alone but reports that his granddaughter will be able to provide assistance. Recommend 24 hour assistance post-acute D/C with home health OT services for follow-up. OT will continue to follow while admitted.    Follow Up Recommendations  Home health OT;Supervision/Assistance - 24 hour    Equipment Recommendations  3 in 1 bedside commode;Tub/shower seat    Recommendations for Other Services       Precautions / Restrictions Precautions Precautions: Fall Restrictions Weight Bearing Restrictions: No      Mobility Bed Mobility Overal bed mobility: Modified Independent             General bed mobility comments: Pt required cues and extra time to scoot to EOB and place both feet on floor.  Transfers Overall transfer level: Needs assistance Equipment used: Rolling walker (2 wheeled) Transfers: Sit to/from Stand Sit to Stand: Min assist         General transfer comment: Min assist due to pain    Balance Overall balance assessment: Needs assistance Sitting-balance support: No upper extremity supported;Feet supported Sitting balance-Leahy Scale: Fair     Standing balance support: Bilateral upper extremity supported;During functional activity Standing balance-Leahy Scale: Poor Standing  balance comment: reliant on RW                           ADL either performed or assessed with clinical judgement   ADL Overall ADL's : Needs assistance/impaired Eating/Feeding: Set up;Sitting   Grooming: Sitting;Set up   Upper Body Bathing: Set up;Sitting   Lower Body Bathing: Minimal assistance;Sit to/from stand   Upper Body Dressing : Set up;Sitting   Lower Body Dressing: Minimal assistance;Sit to/from stand   Toilet Transfer: Min Marine scientist Details (indicate cue type and reason): Simulated from recliner to bed. Toileting- Clothing Manipulation and Hygiene: Minimal assistance;Sit to/from stand       Functional mobility during ADLs: Minimal assistance;Rolling walker General ADL Comments: Pt limited by pain this session impacting ability to participate in ADL.      Vision Patient Visual Report: No change from baseline Vision Assessment?: No apparent visual deficits Additional Comments: Will continue to assess. Pt able to track and make eye-contact following therapist during evaluation. Reports no blurry vision.     Perception     Praxis      Pertinent Vitals/Pain Pain Assessment: 0-10 Pain Score: 10-Worst pain ever Faces Pain Scale: Hurts even more Pain Location: abdomen Pain Descriptors / Indicators: Grimacing;Moaning;Constant;Restless Pain Intervention(s): Limited activity within patient's tolerance;Monitored during session;Repositioned;RN gave pain meds during session;Patient requesting pain meds-RN notified (RN gave pain meds at end of session)     Hand Dominance Right   Extremity/Trunk Assessment Upper Extremity Assessment Upper Extremity Assessment: Overall WFL for tasks assessed           Communication Communication Communication: Prefers language other than [place];Interpreter  utilized (Spanish speaking; Academic librarian used)   Cognition Arousal/Alertness: Awake/alert Behavior During Therapy:  Restless Overall Cognitive Status: Difficult to assess                                 General Comments: Pt able to follow commands and problem solve well. Limited by pain this session so unable to fully assess.   General Comments       Exercises     Shoulder Instructions      Home Living Family/patient expects to be discharged to:: Private residence Living Arrangements: Alone Available Help at Discharge: Family;Neighbor;Available PRN/intermittently (granddaughter coming to stay with him) Type of Home: Apartment Home Access: Level entry;Ramped entrance     Home Layout: One level     Bathroom Shower/Tub: Occupational psychologist: Standard     Home Equipment: Grab bars - toilet;Grab bars - tub/shower;Shower seat      Lives With: Alone    Prior Functioning/Environment Level of Independence: Independent        Comments: Family and neighbor regularly check in on pt.        OT Problem List: Decreased activity tolerance;Impaired balance (sitting and/or standing);Decreased safety awareness;Decreased knowledge of precautions;Decreased knowledge of use of DME or AE;Pain      OT Treatment/Interventions: Self-care/ADL training;Therapeutic exercise;Energy conservation;DME and/or AE instruction;Patient/family education;Balance training;Visual/perceptual remediation/compensation;Cognitive remediation/compensation;Therapeutic activities    OT Goals(Current goals can be found in the care plan section) Acute Rehab OT Goals Patient Stated Goal: to feel better OT Goal Formulation: With patient Time For Goal Achievement: 01/01/17 Potential to Achieve Goals: Good ADL Goals Pt Will Perform Grooming: with modified independence;standing (including gathering items) Pt Will Perform Upper Body Dressing: with modified independence;sitting Pt Will Perform Lower Body Dressing: with modified independence;sit to/from stand Pt Will Transfer to Toilet: with modified  independence;ambulating;bedside commode (BSC over toilet) Pt Will Perform Toileting - Clothing Manipulation and hygiene: with modified independence;sit to/from stand Pt Will Perform Tub/Shower Transfer: with modified independence;rolling walker;Shower transfer  OT Frequency: Min 2X/week   Barriers to D/C:            Co-evaluation              AM-PAC PT "6 Clicks" Daily Activity     Outcome Measure Help from another person eating meals?: None Help from another person taking care of personal grooming?: None Help from another person toileting, which includes using toliet, bedpan, or urinal?: A Little Help from another person bathing (including washing, rinsing, drying)?: A Little Help from another person to put on and taking off regular upper body clothing?: A Little Help from another person to put on and taking off regular lower body clothing?: A Little 6 Click Score: 20   End of Session Equipment Utilized During Treatment: Rolling walker Nurse Communication: Mobility status;Other (comment) (Pt with significant pain)  Activity Tolerance: Patient limited by pain Patient left: in bed;with call bell/phone within reach;with nursing/sitter in room (with interpreter in room)  OT Visit Diagnosis: Unsteadiness on feet (R26.81);Pain;Other symptoms and signs involving the nervous system (R29.898) Pain - Right/Left:  (abdomen)                Time: 9242-6834 OT Time Calculation (min): 13 min Charges:  OT General Charges $OT Visit: 1 Procedure OT Evaluation $OT Eval Moderate Complexity: 1 Procedure G-Codes:     Norman Herrlich, MS OTR/L  Pager: [place]  Mark Benecke 12/18/2016, 12:50 PM

## 2016-12-19 DIAGNOSIS — Y92009 Unspecified place in unspecified non-institutional (private) residence as the place of occurrence of the external cause: Secondary | ICD-10-CM

## 2016-12-19 DIAGNOSIS — K567 Ileus, unspecified: Secondary | ICD-10-CM

## 2016-12-19 LAB — BASIC METABOLIC PANEL
Anion gap: 9 (ref 5–15)
BUN: 11 mg/dL (ref 6–20)
CO2: 25 mmol/L (ref 22–32)
CREATININE: 0.73 mg/dL (ref 0.61–1.24)
Calcium: 8.8 mg/dL — ABNORMAL LOW (ref 8.9–10.3)
Chloride: 90 mmol/L — ABNORMAL LOW (ref 101–111)
GFR calc non Af Amer: >60 mL/min (ref >60)
Glucose, Bld: 112 mg/dL — ABNORMAL HIGH (ref 65–99)
Potassium: 4.2 mmol/L (ref 3.5–5.1)
SODIUM: 124 mmol/L — AB (ref 135–145)

## 2016-12-19 LAB — CBC
HCT: 35.7 % — ABNORMAL LOW (ref 39.0–52.0)
Hemoglobin: 12.3 g/dL — ABNORMAL LOW (ref 13.0–17.0)
MCH: 28.1 pg (ref 26.0–34.0)
MCHC: 34.5 g/dL (ref 30.0–36.0)
MCV: 81.7 fL (ref 78.0–100.0)
PLATELETS: 219 10*3/uL (ref 150–400)
RBC: 4.37 MIL/uL (ref 4.22–5.81)
RDW: 18.7 % — AB (ref 11.5–15.5)
WBC: 20.3 10*3/uL — ABNORMAL HIGH (ref 4.0–10.5)

## 2016-12-19 LAB — GLUCOSE, CAPILLARY: Glucose-Capillary: 127 mg/dL — ABNORMAL HIGH (ref 65–99)

## 2016-12-19 MED ORDER — DEXTROSE-NACL 5-0.9 % IV SOLN
INTRAVENOUS | Status: AC
  Administered 2016-12-19 – 2016-12-20 (×2): via INTRAVENOUS

## 2016-12-19 NOTE — Progress Notes (Signed)
Subjective: [name] was evaluated on rounds this morning. He was in less discomfort than yesterday, but still complained of some abdominal tenderness. He was responsive to voice when we saw him this morning. However, it was difficult to maintain conversation with him as he would close his eyes and offer limited response. He denied worsening headaches and his nurse added that he had not vomited over night. He also denied passing gas or having bowel movements.  Objective: Vital signs in last 24 hours: Vitals:   12/18/16 1829 12/18/16 2123 12/19/16 0200 12/19/16 0452  BP: (!) 160/73 (!) 145/85 (!) 142/80 128/64  Pulse:  (!) 120 100 (!) 114  Resp:  20 20 20   Temp:  99 F (37.2 C) 98.9 F (37.2 C) 99.8 F (37.7 C)  TempSrc:  Axillary Axillary Axillary  SpO2:  95% 96% 95%  Weight:      Height:        BP 128/64 (BP Location: Left Arm)   Pulse (!) 114   Temp 99.8 F (37.7 C) (Axillary)   Resp 20   Ht 5\' 5"  (1.651 m)   Wt 70.3 kg (155 lb)   SpO2 95%   BMI 25.79 kg/m   General Appearance:   Responsive to voice, somewhat difficult to maintain attention  Head:   Bruising apparent along with surgical scar on right  Eyes:   Subconjunctival hemorrhage evident in right eye, ecchymosis around bilateral eyes   Lungs:     Clear to auscultation bilaterally, respirations unlabored  Chest wall:    Defibrillator placement evident   Heart:    Regular rate and rhythm, S1 and S2 normal, no murmur, rub   or gallop  Abdomen:     tender, bowel sounds diminished all four quadrants,    no masses, no organomegaly  Neurologic:   CNII-XII intact. Normal strength, sensation and reflexes      throughout   Lab Results: Basic Metabolic Panel:  Recent Labs  12/17/16 0236 12/18/16 0533 12/19/16 0632  NA 121* 123* 124*  K 4.0 3.8 4.2  CL 87* 90* 90*  CO2 24 25 25   GLUCOSE 149* 117* 112*  BUN 16 13 11   CREATININE 0.83 0.64 0.73  CALCIUM 9.0 8.6* 8.8*  MG 2.1 2.0  --   PHOS 3.1 3.1  --     Liver Function Tests:  Recent Labs  12/17/16 0236  AST 32  ALT 34  ALKPHOS 62  BILITOT 1.3*  PROT 7.1  ALBUMIN 3.7   CBC:  Recent Labs  12/18/16 0533 12/19/16 0632  WBC 11.1* 20.3*  HGB 11.6* 12.3*  HCT 33.1* 35.7*  MCV 81.1 81.7  PLT 206 219    Recent Labs  12/17/16 1651 12/17/16 2155 12/18/16 0636 12/18/16 1112 12/18/16 1635 12/18/16 2130  GLUCAP 151* 132* 123* 152* 158* 141*     Studies/Results: Dg Abd Acute W/chest  Result Date: 12/18/2016 CLINICAL DATA:  Abdominal pain. EXAM: DG ABDOMEN ACUTE W/ 1V CHEST COMPARISON:  CT 11/16/2016. KUB 02/29/2016. Chest x-ray 02/29/2016. FINDINGS: Mild cardiomegaly. Low lung volumes with mild basilar atelectasis. Mild basilar infiltrates cannot be excluded. Soft tissues the abdomen are unremarkable. Distended loops of small bowel are noted. Colonic gas pattern is normal. Stool in the colon. Findings suggest adynamic ileus. To exclude small bowel obstruction follow-up abdominal exam is suggested. No free air. Pelvic calcifications consistent phleboliths. Prior lumbar spine fusion. IMPRESSION: 1. Mild cardiomegaly. Low lung volumes with mild basilar atelectasis. Mild basilar infiltrates cannot be excluded. 2.  Distended loops of small bowel. Colonic gas pattern is normal. Stool in colon. Findings suggest adynamic ileus. Follow-up abdominal series suggested to exclude small-bowel obstruction . No free air . Electronically Signed   By: Marcello Moores  Register   On: 12/18/2016 16:29   Assessment/Plan: 71 year old man with history of chronic hyponatremia secondary to SIADH, CAD, DM, hypertension, diverticulosis, and depression presented on 4/27 after fall with LOC found to have subdural hematoma and nondisplaced left orbital floor fracture on CT head. Over the last 24 hours he has had multiple episodes of nausea and vomiting with abdominal pain, along with an KUB, which showed diffuse distention of small bowel suggestive of ileus.   Abdominal  Pain: -NPO -Zofran prn -If vomiting continues or if abdominal pain does not improve, consider placing NG tube  SDH: -Continue Keppra BID   -Neuro checks  -Follow up CT if exam changes   Chronic Hyponatremia/ SIADH: -d/c demeclocycline -consider adding tolivaptan for long-term management  -PO fluid restriction   HTN:  -Maintain SBP <140 -Hydralazine 5mg  q4hr prn elevated BP  VTE ppx: SCDs  Diet: NPO Code: FULL    This is a Careers information officer Note.  The care of the patient was discussed with Dr. Jari Favre and the assessment and plan formulated with their assistance.  Please see their attached note for official documentation of the daily encounter.   LOS: 4 days   Cyndie Mull, Medical Student 12/19/2016, 10:56 AM

## 2016-12-19 NOTE — Progress Notes (Signed)
Internal Medicine Attending:   I saw and examined the patient. I reviewed the resident's note and I agree with the resident's findings and plan as documented in the resident's note.  71 year old man admitted with a subdural and parenchymal hemorrhage after a fall at home which is being managed conservatively. His neuro exam is stable, he awakens to voice and answers simple questions, but is tired appearing today. Hospital course complicated by acute urinary retention, likely due to opioids and underlying BPH, treated with foley catheter. Also has a partial illeus with abdominal distention, nausea, and vomiting which is improving today. We are managing conservatively, NPO, stop opioids, and place NG tube if vomiting or pain worsens. Last problem is SIADH which has been a chronic problem for over one month. We are managing now with fluid restriction and I am considering starting a vaptan this admission once he can take oral meds to help his long-term control.

## 2016-12-19 NOTE — Progress Notes (Signed)
   Subjective:  Interpreter used to facilitate interview.  Patient states that he is feeling better than yesterday and that his abdominal pain is improved; he is unsure if he has passed flatus. He is unsure if he has vomited.  Per nursing, patient has not had bowel movements yet; he has not had further vomiting episodes since yesterday morning.   Objective:  Vital signs in last 24 hours: Vitals:   12/18/16 1829 12/18/16 2123 12/19/16 0200 12/19/16 0452  BP: (!) 160/73 (!) 145/85 (!) 142/80 128/64  Pulse:  (!) 120 100 (!) 114  Resp:  20 20 20   Temp:  99 F (37.2 C) 98.9 F (37.2 C) 99.8 F (37.7 C)  TempSrc:  Axillary Axillary Axillary  SpO2:  95% 96% 95%  Weight:      Height:       Constitutional: NAD, ill appearing elderly gentleman CV: RRR, no murmurs, rubs or gallops appreciated, pulses intact, no LE edema, warm extremities Resp: CTAB Abd: distended, tympanic, decreased but present bowel sounds  Assessment/Plan:  Active Problems:   SDH (subdural hematoma) (HCC)   At risk for altered respiratory function  Ileus:  Patient with new onset abdominal distension, tympanic to percussion, decreased bowel sounds, nausea, vomiting, and KUB consistent with significant bowel movement and gaseous bowel distension consistent with ileus likely secondary to other illness and opioid use during this admission. Bedside ultrasound shows left sided bowel wall thickening, bowel distension with lack of normal peristaltic movement. Abdominal pain is improving and no further vomiting since yesterday which is reassuring; if symptoms worsen, would consider NGT placement for decompression.  --bowel regimen --NPO --limit opioids --if worsening symptoms, consider NGT placement for decompression   SDH: --Keppra 500mg  BID day 5/7 --HH PT/OT at discharge --If focal neuro deficits, would consider repeat CT head for re-eval of SDH  SIADH: Baseline of 125-129; his fall which led to SDH was likely  caused by his SIADH so would consider treatment to prevent further morbidity. He was placed on demeclocycline 150mg  BID, which was held yesterday in setting of ileus and no IV form. 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 Once able to take PO, will look into other options like vaptans if price is not limiting. AM sodium 124 --fluid restriction, though NPO for now --d/c demeclocycline --follow AM Bmet  HTN: --Maintain SBP <140 --hold home lisinopril; started on amlodipine yesterday but held due to NPO --Hydralazine 10mg  q4hr PRN for now; will restart oral meds when able  T2DM: Hgb A1c 6.1; home med is metformin. --hold metformin --CBG qac/wc --SSI  VTE ppx: SCDs Diet: NPO Code: FULL  Dispo: Anticipated discharge in approximately 2-3 day(s).   Alphonzo Grieve, MD 12/19/2016, 1:42 PM Pager [phone]

## 2016-12-19 NOTE — Care Management Important Message (Signed)
Important Message  Patient Details  Name: [name] MRN: [id number] Date of Birth: 06/21/1946   Medicare Important Message Given:  Yes    Quinisha Mould Montine Circle 12/19/2016, 12:05 PM

## 2016-12-20 ENCOUNTER — Encounter (HOSPITAL_COMMUNITY): Payer: Self-pay | Admitting: Radiology

## 2016-12-20 ENCOUNTER — Inpatient Hospital Stay (HOSPITAL_COMMUNITY): Payer: Medicare Other

## 2016-12-20 DIAGNOSIS — K659 Peritonitis, unspecified: Secondary | ICD-10-CM

## 2016-12-20 LAB — HEPATIC FUNCTION PANEL
ALBUMIN: 2.6 g/dL — AB (ref 3.5–5.0)
ALT: 70 U/L — ABNORMAL HIGH (ref 17–63)
AST: 49 U/L — AB (ref 15–41)
Alkaline Phosphatase: 104 U/L (ref 38–126)
BILIRUBIN INDIRECT: 0.6 mg/dL (ref 0.3–0.9)
Bilirubin, Direct: 0.4 mg/dL (ref 0.1–0.5)
TOTAL PROTEIN: 6.2 g/dL — AB (ref 6.5–8.1)
Total Bilirubin: 1 mg/dL (ref 0.3–1.2)

## 2016-12-20 LAB — CBC
HEMATOCRIT: 32.7 % — AB (ref 39.0–52.0)
Hemoglobin: 11.1 g/dL — ABNORMAL LOW (ref 13.0–17.0)
MCH: 28 pg (ref 26.0–34.0)
MCHC: 33.9 g/dL (ref 30.0–36.0)
MCV: 82.4 fL (ref 78.0–100.0)
PLATELETS: 204 10*3/uL (ref 150–400)
RBC: 3.97 MIL/uL — ABNORMAL LOW (ref 4.22–5.81)
RDW: 19 % — AB (ref 11.5–15.5)
WBC: 18.3 10*3/uL — AB (ref 4.0–10.5)

## 2016-12-20 LAB — GLUCOSE, CAPILLARY
GLUCOSE-CAPILLARY: 131 mg/dL — AB (ref 65–99)
Glucose-Capillary: 120 mg/dL — ABNORMAL HIGH (ref 65–99)
Glucose-Capillary: 117 mg/dL — ABNORMAL HIGH (ref 65–99)

## 2016-12-20 LAB — BASIC METABOLIC PANEL
ANION GAP: 9 (ref 5–15)
BUN: 13 mg/dL (ref 6–20)
CO2: 24 mmol/L (ref 22–32)
Calcium: 8.5 mg/dL — ABNORMAL LOW (ref 8.9–10.3)
Chloride: 93 mmol/L — ABNORMAL LOW (ref 101–111)
Creatinine, Ser: 0.8 mg/dL (ref 0.61–1.24)
Glucose, Bld: 122 mg/dL — ABNORMAL HIGH (ref 65–99)
Potassium: 3.9 mmol/L (ref 3.5–5.1)
SODIUM: 126 mmol/L — AB (ref 135–145)

## 2016-12-20 MED ORDER — PIPERACILLIN-TAZOBACTAM 3.375 G IVPB
3.3750 g | Freq: Three times a day (TID) | INTRAVENOUS | Status: DC
  Administered 2016-12-20 – 2016-12-24 (×12): 3.375 g via INTRAVENOUS
  Filled 2016-12-20 (×14): qty 50

## 2016-12-20 MED ORDER — IOPAMIDOL (ISOVUE-300) INJECTION 61%
INTRAVENOUS | Status: AC
  Administered 2016-12-20: 100 mL
  Filled 2016-12-20: qty 100

## 2016-12-20 MED ORDER — IOPAMIDOL (ISOVUE-300) INJECTION 61%
INTRAVENOUS | Status: AC
  Filled 2016-12-20: qty 30

## 2016-12-20 MED ORDER — DEXTROSE-NACL 5-0.9 % IV SOLN
INTRAVENOUS | Status: AC
  Administered 2016-12-20: 15:00:00 via INTRAVENOUS

## 2016-12-20 NOTE — Progress Notes (Signed)
Subjective: This morning Mr. Hedeen mentioned that he is having pain in his head and some discomfort in his abdomen overnight. It is unclear whether or not he has been vomiting. Later in the morning, when we saw the patient on rounds, he appeared much more uncomfortable and was clutching his arms with knees bent toward his abdomen. At this time he was still responsive but appeared to be in moderate distress and in worse pain than he was two hours prior.    Objective: Vital signs in last 24 hours: Vitals:   12/19/16 2110 12/20/16 0210 12/20/16 0539 12/20/16 1022  BP: 134/71 140/67 134/66 (!) 152/73  Pulse: (!) 102 96 (!) 101 (!) 104  Resp: 18 18 16 17   Temp: 98.7 F (37.1 C) 98.8 F (37.1 C) 99 F (37.2 C) (!) 101.2 F (38.4 C)  TempSrc: Oral Oral Oral Oral  SpO2: 97% 98% 99% 96%  Weight:      Height:        Intake/Output Summary (Last 24 hours) at 12/20/16 1147 Last data filed at 12/20/16 0541  Gross per 24 hour  Intake              995 ml  Output              650 ml  Net              345 ml   Physical Exam: Constitutional: Ill appearing, not moving and hunched with extremities drawn in  Abd: distended, tympanic, decreased bowel sounds, diffuse pain with rebound and guarding  Assessment/Plan: Active Problems   SDH (subdural hematoma)  Illeus   Peritonitis   SIADH  Illeus/ Peritonitis:  On rounds the patient appeared distended and presented with diffuse tenderness, rebound tenderness, guarding and was overall in a hunched positions and appeared generally uncomfortable. The combination of signs of peritonitis on physical exam with a temperature that recently rose to 104 with a WBC that has remained elevated suggests an acute inflammatory process such as peritonitis. In a patient such as Mr. Beville, who has had a multi-day history of lack of bowel movements, and abdominal distention and discomfort due to suspected illeus this is concerning for perforation, complete small  bowel obstruction, or bacterial infection. We have ordered a CT scan of the abdomen with both oral and IV contrast, which should help elucidate the cause of the patient's worsening clinical status.  --CT abdomen with oral and IV contrast  --NPO --limit opioids  --NGT placement if Small Bowel Obstruction   SDH: Mr. Gasbarro has not had any acute neurological changes over the last several days and remains able to respond to questions through an interpreter. His lack of neurological changes suggest that his SDH is currently stable.  --Keppra 500mg  BID day 6/7 --If focal neuro deficits, would consider repeat CT head for re-eval SDH  --PT/OT at discharge   SIADH: Patient has baseline hyponatremia with Na 125-129. Today his Na was up from previous values at 126 and consistent with his baseline. He has been maintained NPO for concerns of illeus, which takes care of the fluid restriction necessary to treat SIADH. Further attempts at management will have to wait for the patient to tollerate PO intake, but could be prudent cvonsidering this disorder's possible causative role in the patients fall.  --Fluid restriction, NPO now --follow Bmet in AM   HTN: Recommended to maintain SBP <140 due to recent history of SDH. So far he has needed PRN hydralazine for pressures >  140.  --Maintain SBP <140 --Hydralazine 10mg  q4hr PRN   T2DM:  --holding home Metformin   VTE ppx: SCDs Diet: NPO Code: FULL  This is a Careers information officer Note.  The care of the patient was discussed with Dr. Jari Favre and the assessment and plan formulated with their assistance.  Please see their attached note for official documentation of the daily encounter.   LOS: 5 days   Cyndie Mull, Medical Student 12/20/2016, 11:47 AM

## 2016-12-20 NOTE — Progress Notes (Signed)
Pharmacy Antibiotic Note  [name] is a 71 y.o. male with cholecystitis.  Pharmacy has been consulted for Zosyn dosing. The patient's white count has been fluctuating, but trending up over the last two days. His fever curve is up with a 24 hour Tmax of 101.41F. No cultures have been collected and renal function is stable. CT abdomen is showing possible cholecystectomy, so the team has decided to start zosyn and have surgery consult for possible cholecystectomy this admission.   Plan: Zosyn 3.375g IV q8h (4 hour infusion).  Monitor clinical status, length of therapy, ability to de-escalate F/u surgical plans  Height: 5\' 5"  (165.1 cm) Weight: 155 lb (70.3 kg) IBW/kg (Calculated) : 61.5  Temp (24hrs), Avg:99.7 F (37.6 C), Min:98.7 F (37.1 C), Max:101.2 F (38.4 C)   Recent Labs Lab 12/16/16 0220 12/16/16 2012 12/17/16 0236 12/18/16 0533 12/19/16 0632 12/20/16 0414  WBC 11.9*  --  15.1* 11.1* 20.3* 18.3*  CREATININE 0.76 0.81 0.83 0.64 0.73 0.80    Estimated Creatinine Clearance: 72.6 mL/min (by C-G formula based on SCr of 0.8 mg/dL).    No Known Allergies  Antimicrobials this admission: Zosyn 5/3 >>   Dose adjustments this admission:  Microbiology results: 4/28 MRSA PCR: Negative   Thank you for allowing pharmacy to be a part of this patient's care.  Dierdre Harness, [place], PharmD Clinical Pharmacy Resident [phone] (Pager) 12/20/2016 2:50 PM

## 2016-12-20 NOTE — Progress Notes (Signed)
   Subjective:  Interpreter used to facilitate interview.  Patient initially evaluated early this morning; at that time he reported no abdominal pain only a slight frontal headache. Exam at the time revealed active bowel sounds, soft, mildly distended abdomen with continued but somewhat improved right sided tenderness. Patient appeared comfortable in bed.   On re-eval with rounding team, patient had spiked a fever to 101.66F. He was complaining of significant abdominal pain and nausea.   Objective:  Vital signs in last 24 hours: Vitals:   12/19/16 2110 12/20/16 0210 12/20/16 0539 12/20/16 1022  BP: 134/71 140/67 134/66 (!) 152/73  Pulse: (!) 102 96 (!) 101 (!) 104  Resp: 18 18 16 17   Temp: 98.7 F (37.1 C) 98.8 F (37.1 C) 99 F (37.2 C) (!) 101.2 F (38.4 C)  TempSrc: Oral Oral Oral Oral  SpO2: 97% 98% 99% 96%  Weight:      Height:       Constitutional: ill appearing elderly gentleman in moderate distress CV: RRR, no murmurs, rubs or gallops appreciated, pulses intact, no LE edema, warm extremities Resp: CTAB Abd: distended, rigid, focal RUQ tenderness, rebound tenderness, guarding, diminished bowel sounds  Assessment/Plan:  Active Problems:   SDH (subdural hematoma) (HCC)   At risk for altered respiratory function  Peritonitis:  Patient initially with signs and symptoms of ileus that seemed to be improving with bowel rest even at initial evaluation early this morning. However, now with more rigid abdomen, decreased bowel sounds, rebound tenderness and still focal tenderness to RUQ. He has had prior abdominal surgery which would increase his risk of bowel obstruction 2/2 adhesions and in setting of rapid progression of peritoneal signs and fever, there is concern for perforation vs abscess. 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 He was placed on demeclocycline 150mg  BID, which was held in setting of ileus and no IV form. 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 Once able to take PO, will look into other options like vaptans if price is not limiting. AM sodium 126 --fluid restriction --D5NS --follow AM Bmet  HTN: --Maintain SBP <140 --hold home lisinopril --Hydralazine 10mg  q4hr PRN for now; will restart oral meds when able  T2DM: Hgb A1c 6.1; home med is metformin. --hold metformin --CBG qac/wc --SSI  VTE ppx: SCDs Diet: NPO Code: FULL  Dispo: Anticipated discharge in approximately 4-5 day(s).   Alphonzo Grieve, MD 12/20/2016, 11:56 AM Pager [phone]

## 2016-12-20 NOTE — Progress Notes (Addendum)
Rehab Admissions Coordinator Note:  Patient was screened by Cleatrice Burke for appropriateness for an Inpatient Acute Rehab Consult per PT and OT recommendations.  At this time, we are recommending Inpatient Rehab consult. Please place an order for a consult if pt would like to be considered for an inpt rehab admit. I contacted Resident at [phone] and she stated too soon for consult. I stressed need for early dispo planning before pt medically ready for coordination of needs.  Cleatrice Burke 12/20/2016, 5:10 PM  I can be reached at [phone].

## 2016-12-20 NOTE — Progress Notes (Signed)
SLP Cancellation Note  Patient Details Name: [name] MRN: [id number] DOB: 11/08/1945   Cancelled treatment:       Reason Eval/Treat Not Completed: Patient with PT, and preparing to go to ultrasound.  Will f/u next date.    Juan Quam Laurice 12/20/2016, 3:18 PM

## 2016-12-20 NOTE — Progress Notes (Signed)
Occupational Therapy Treatment Patient Details Name: [name] MRN: [id number] DOB: 03/09/1946 Today's Date: 12/20/2016    History of present illness 71 y.o. male admitted for fall and brief LOC with history of SIADH,  chronic hyponatremia,DM2, HTN, diverticulosis, anemia and depression    OT comments  Pt's session limited by pain and fatigue. Session focused on seated ADL and functional transfer so he could go to a test/procedure. His family member was present initially and reported that the pt's sons and granddaughter are planning to have 24/7 assist at home (potentially his family from [place] might arrange to immigrate to the [place] to care for him)  PTA, pt was independent and living alone.   OT feels CIR is appropriate to maximize his independence and safety in ADL and functional transfers before return home.  If pt unable to improve activity tolerance, a higher level of care may be more appropriate.   OT Will continue to follow acutely.  Follow Up Recommendations  CIR;Supervision/Assistance - 24 hour    Equipment Recommendations  Other (comment) (defer to next venue)    Recommendations for Other Services Rehab consult    Precautions / Restrictions Precautions Precautions: Fall Restrictions Weight Bearing Restrictions: No       Mobility Bed Mobility Overal bed mobility: Needs Assistance Bed Mobility: Sit to Supine     Supine to sit: Min assist Sit to supine: Mod assist;HOB elevated (20 degrees)   General bed mobility comments: mod assist for lifting BLE back into bed, Pt able to support trunk  Transfers Overall transfer level: Needs assistance Equipment used: Rolling walker (2 wheeled) Transfers: Sit to/from Stand Sit to Stand: Min assist         General transfer comment: verbal Cues required to push up with hands from bed.  Cues to reach back for safe surface with stand to sit but pt did not follow.  Extra time needed.    Balance Overall balance assessment:  Needs assistance Sitting-balance support: Bilateral upper extremity supported;Feet supported Sitting balance-Leahy Scale: Poor Sitting balance - Comments: Pt needed physical assist x2 to regain sitting balance from leaning posterior/left.   Standing balance support: Bilateral upper extremity supported Standing balance-Leahy Scale: Poor Standing balance comment: reliant on RW for balance and support                           ADL either performed or assessed with clinical judgement   ADL Overall ADL's : Needs assistance/impaired     Grooming: Wash/dry face;Set up;Sitting;Wash/dry hands Grooming Details (indicate cue type and reason): Pt initially stated he was too tired to participate, but after explaining importance he was willing to participate                 Toilet Transfer: Minimal assistance;+2 for safety/equipment;RW;Cueing for safety;Stand-pivot Toilet Transfer Details (indicate cue type and reason): vc required for safe hand placement and will continue to require reinforcement; Simulated from recliner to bed.         Functional mobility during ADLs: Minimal assistance;Rolling walker (+2 helpful but not necessary) General ADL Comments: Pt limited by lethargy this session impacting his ability to perform ADL     Vision       Perception     Praxis      Cognition Arousal/Alertness: Awake/alert Behavior During Therapy: WFL for tasks assessed/performed Overall Cognitive Status: Difficult to assess  Exercises     Shoulder Instructions       General Comments     Pertinent Vitals/ Pain       Pain Assessment: 0-10 Pain Score: 6  Pain Location: abdomen Pain Descriptors / Indicators: Grimacing;Constant Pain Intervention(s): Repositioned;Limited activity within patient's tolerance;Monitored during session  Home Living                                          Prior  Functioning/Environment              Frequency  Min 3X/week        Progress Toward Goals  OT Goals(current goals can now be found in the care plan section)  Progress towards OT goals: Not progressing toward goals - comment (discharge, frequency, and equipment changed to reflect)  Acute Rehab OT Goals Patient Stated Goal: to feel better OT Goal Formulation: With patient Time For Goal Achievement: 01/01/17 Potential to Achieve Goals: Good  Plan Discharge plan needs to be updated;Frequency needs to be updated;Equipment recommendations need to be updated    Co-evaluation                 AM-PAC PT "6 Clicks" Daily Activity     Outcome Measure   Help from another person eating meals?: None Help from another person taking care of personal grooming?: A Little Help from another person toileting, which includes using toliet, bedpan, or urinal?: A Little Help from another person bathing (including washing, rinsing, drying)?: A Lot Help from another person to put on and taking off regular upper body clothing?: A Little Help from another person to put on and taking off regular lower body clothing?: A Lot 6 Click Score: 17    End of Session Equipment Utilized During Treatment: Gait belt;Rolling walker  OT Visit Diagnosis: Unsteadiness on feet (R26.81);Pain;Other symptoms and signs involving the nervous system (R29.898) Pain - Right/Left: Right Pain - part of body:  (abdomen)   Activity Tolerance Patient limited by fatigue   Patient Left in bed;Other (comment) (going with transport for a procedure/test)   Nurse Communication Mobility status        Time: [phone] OT Time Calculation (min): 10 min  Charges: OT General Charges $OT Visit: 1 Procedure OT Treatments $Therapeutic Activity: 8-22 mins  12/20/2016  Hulda Humphrey OTR/L [place] 12/20/2016, 4:49 PM

## 2016-12-20 NOTE — Progress Notes (Signed)
Physical Therapy Treatment Patient Details Name: [name] MRN: [id number] DOB: [birth year] Today's Date: 12/20/2016    History of Present Illness 71 y.o. male admitted for fall and brief LOC with history of SIADH,  chronic hyponatremia,DM2, HTN, diverticulosis, anemia and depression     PT Comments    This session was limited due to pt pain fatigue and tachicardia (HR 157 sitting EOB).  Pt stated he felt too weak to walk but tolerated ambulation from bed to chair ~5 ft total requiring min A with RW.  His family member was present (introduced herself as his granddaughter's mother) and reported that the pt's sons and granddaughter are planning to have 24/7 assist at home, and his family member in [place] might arrange to immigrate to the [place] to care for him.  PTA, pt was independent and living alone.  Feel CIR is appropriate to maximize his independence before return home.  If pt unable to improve activity tolerance, a higher level of care may be more appropriate.  Will continue to follow acutely.    Follow Up Recommendations  CIR;Supervision/Assistance - 24 hour     Equipment Recommendations   (TBD)    Recommendations for Other Services       Precautions / Restrictions Precautions Precautions: Fall Restrictions Weight Bearing Restrictions: No    Mobility  Bed Mobility Overal bed mobility: Needs Assistance Bed Mobility: Supine to Sit     Supine to sit: Min assist     General bed mobility comments: Pt initially requested help from family visitor.  Pt required cues to reach, use bedrail and move LE's off EOB.  Min assist required to maintain balance and scoot to EOB.  HR 157 upon sitting EOB.   Transfers Overall transfer level: Needs assistance Equipment used: Rolling walker (2 wheeled) Transfers: Sit to/from Stand Sit to Stand: Min assist         General transfer comment: Cues required to push up with hands from bed.  Cues to reach back for armrests with stand>sit  but pt did not follow.  Extra time needed.  Ambulation/Gait Ambulation/Gait assistance: Min assist Ambulation Distance (Feet): 5 Feet (bed > chair > bed) Assistive device: Rolling walker (2 wheeled) Gait Pattern/deviations: Step-through pattern;Decreased stride length;Trunk flexed;Narrow base of support Gait velocity: decreased Gait velocity interpretation: Below normal speed for age/gender General Gait Details: Upon standing pt states he cannot walk because he feels weak.  PT encouraged walking into hall but pt declined.  HR ~150s upon sitting after ambulating.  Pt ambulated 2 ft back to bed at end of session for procedure.   Stairs            Wheelchair Mobility    Modified Rankin (Stroke Patients Only)       Balance Overall balance assessment: Needs assistance Sitting-balance support: Bilateral upper extremity supported;Feet supported Sitting balance-Leahy Scale: Fair Sitting balance - Comments: Pt needed physical assist x2 to regain sitting balance from leaning posterior/left.   Standing balance support: Bilateral upper extremity supported Standing balance-Leahy Scale: Poor Standing balance comment: reliant on RW                            Cognition Arousal/Alertness: Awake/alert Behavior During Therapy: WFL for tasks assessed/performed Overall Cognitive Status: Difficult to assess  Exercises      General Comments General comments (skin integrity, edema, etc.): R eye very red      Pertinent Vitals/Pain Pain Assessment: 0-10 Pain Score: 6  Pain Location: abdomen Pain Descriptors / Indicators: Grimacing;Constant Pain Intervention(s): Limited activity within patient's tolerance;Monitored during session;Repositioned    Home Living                      Prior Function            PT Goals (current goals can now be found in the care plan section) Acute Rehab PT Goals Patient Stated  Goal: to feel better Time For Goal Achievement: 12/27/16 Potential to Achieve Goals: Fair Progress towards PT goals: Progressing toward goals (Session limited due to pt tachycardia and fatigue)    Frequency    Min 4X/week      PT Plan Discharge plan needs to be updated    Co-evaluation              AM-PAC PT "6 Clicks" Daily Activity  Outcome Measure  Difficulty turning over in bed (including adjusting bedclothes, sheets and blankets)?: Total Difficulty moving from lying on back to sitting on the side of the bed? : Total Difficulty sitting down on and standing up from a chair with arms (e.g., wheelchair, bedside commode, etc,.)?: Total Help needed moving to and from a bed to chair (including a wheelchair)?: A Little Help needed walking in hospital room?: A Little Help needed climbing 3-5 steps with a railing? : A Lot 6 Click Score: 11    End of Session Equipment Utilized During Treatment: Gait belt Activity Tolerance: Patient limited by fatigue;Other (comment) Patient left: in bed (Tech arrived to take pt to [place].) Nurse Communication: Mobility status PT Visit Diagnosis: Unsteadiness on feet (R26.81);Repeated falls (R29.6);Muscle weakness (generalized) (M62.81)     Time: 5638-9373 PT Time Calculation (min) (ACUTE ONLY): 26 min  Charges:  $Gait Training: 23-37 mins                    G Codes:       [name] SPT   [name] 12/20/2016, 4:09 PM
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
## 2016-12-20 NOTE — Progress Notes (Signed)
Internal Medicine Attending:   I saw and examined the patient. I reviewed the resident's note and I agree with the resident's findings and plan as documented in the resident's note.  71 year old man is hospital day #5 for management of a subdural hematoma after a fall at home. Over the last 2 days his hospital course has been complicated by new onset abdominal distention and pain. We initially suspected illeus based on plain films that showed distended small bowel. Today he is febrile, and on exam his abdomen is more distended, with moderate rebound tenderness and peritoneal signs. CT abdomen is showing gallbladder wall thickening and distention consistent with possible cholecystitis. I added LFTs to today's labs. Plan is to start antibiotics wit Pip/Tazo, order RUQ ultrasound to eval for stones, and consult surgery to consider cholecystectomy this admission.

## 2016-12-21 ENCOUNTER — Encounter (HOSPITAL_COMMUNITY): Payer: Self-pay | Admitting: General Surgery

## 2016-12-21 ENCOUNTER — Inpatient Hospital Stay (HOSPITAL_COMMUNITY): Payer: Medicare Other

## 2016-12-21 HISTORY — PX: IR PERC CHOLECYSTOSTOMY: IMG2326

## 2016-12-21 LAB — COMPREHENSIVE METABOLIC PANEL
ALK PHOS: 124 U/L (ref 38–126)
ALT: 79 U/L — ABNORMAL HIGH (ref 17–63)
ANION GAP: 10 (ref 5–15)
AST: 64 U/L — AB (ref 15–41)
Albumin: 2 g/dL — ABNORMAL LOW (ref 3.5–5.0)
BILIRUBIN TOTAL: 0.7 mg/dL (ref 0.3–1.2)
BUN: 12 mg/dL (ref 6–20)
CALCIUM: 8.1 mg/dL — AB (ref 8.9–10.3)
CO2: 24 mmol/L (ref 22–32)
Chloride: 94 mmol/L — ABNORMAL LOW (ref 101–111)
Creatinine, Ser: 0.76 mg/dL (ref 0.61–1.24)
GFR calc Af Amer: >60 mL/min (ref >60)
GFR calc non Af Amer: >60 mL/min (ref >60)
Glucose, Bld: 109 mg/dL — ABNORMAL HIGH (ref 65–99)
POTASSIUM: 3.6 mmol/L (ref 3.5–5.1)
Sodium: 128 mmol/L — ABNORMAL LOW (ref 135–145)
TOTAL PROTEIN: 5.8 g/dL — AB (ref 6.5–8.1)

## 2016-12-21 LAB — GLUCOSE, CAPILLARY
GLUCOSE-CAPILLARY: 112 mg/dL — AB (ref 65–99)
GLUCOSE-CAPILLARY: 103 mg/dL — AB (ref 65–99)
Glucose-Capillary: 133 mg/dL — ABNORMAL HIGH (ref 65–99)
Glucose-Capillary: 111 mg/dL — ABNORMAL HIGH (ref 65–99)
Glucose-Capillary: 123 mg/dL — ABNORMAL HIGH (ref 65–99)
Glucose-Capillary: 119 mg/dL — ABNORMAL HIGH (ref 65–99)
Glucose-Capillary: 104 mg/dL — ABNORMAL HIGH (ref 65–99)

## 2016-12-21 LAB — CBC
HCT: 29.9 % — ABNORMAL LOW (ref 39.0–52.0)
Hemoglobin: 10.2 g/dL — ABNORMAL LOW (ref 13.0–17.0)
MCH: 28.2 pg (ref 26.0–34.0)
MCHC: 34.1 g/dL (ref 30.0–36.0)
MCV: 82.6 fL (ref 78.0–100.0)
PLATELETS: 182 10*3/uL (ref 150–400)
RBC: 3.62 MIL/uL — AB (ref 4.22–5.81)
RDW: 19.4 % — AB (ref 11.5–15.5)
WBC: 14.8 10*3/uL — ABNORMAL HIGH (ref 4.0–10.5)

## 2016-12-21 LAB — SURGICAL PCR SCREEN
MRSA, PCR: NEGATIVE
Staphylococcus aureus: NEGATIVE

## 2016-12-21 LAB — PROTIME-INR
INR: 1.2
Prothrombin Time: 15.3 seconds — ABNORMAL HIGH (ref 11.4–15.2)

## 2016-12-21 MED ORDER — FENTANYL CITRATE (PF) 100 MCG/2ML IJ SOLN
INTRAMUSCULAR | Status: AC | PRN
  Administered 2016-12-21: 25 ug via INTRAVENOUS

## 2016-12-21 MED ORDER — LIDOCAINE HCL 1 % IJ SOLN
INTRAMUSCULAR | Status: AC
  Filled 2016-12-21: qty 20

## 2016-12-21 MED ORDER — LIDOCAINE HCL 1 % IJ SOLN
INTRAMUSCULAR | Status: AC | PRN
  Administered 2016-12-21: 10 mL

## 2016-12-21 MED ORDER — DEXTROSE-NACL 5-0.9 % IV SOLN
INTRAVENOUS | Status: DC
  Administered 2016-12-21 – 2016-12-22 (×2): via INTRAVENOUS

## 2016-12-21 MED ORDER — ACETAMINOPHEN 650 MG RE SUPP: 650.0000 mg | RECTAL | Status: DC | PRN

## 2016-12-21 MED ORDER — MIDAZOLAM HCL 2 MG/2ML IJ SOLN
INTRAMUSCULAR | Status: AC | PRN
  Administered 2016-12-21: 1 mg via INTRAVENOUS

## 2016-12-21 MED ORDER — MIDAZOLAM HCL 2 MG/2ML IJ SOLN
INTRAMUSCULAR | Status: AC
  Filled 2016-12-21: qty 4

## 2016-12-21 MED ORDER — FENTANYL CITRATE (PF) 100 MCG/2ML IJ SOLN
INTRAMUSCULAR | Status: AC
  Filled 2016-12-21: qty 4

## 2016-12-21 MED ORDER — IOPAMIDOL (ISOVUE-300) INJECTION 61%
INTRAVENOUS | Status: AC
  Administered 2016-12-21: 10 mL
  Filled 2016-12-21: qty 50
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
## 2016-12-21 NOTE — Progress Notes (Signed)
Md informed about patient's temperature reading of 101.8. No new orders given now. Will continue to monitor

## 2016-12-21 NOTE — Progress Notes (Signed)
[facility] Progress Note     Subjective: CC: RUQ pain  Patient is asleep but arousable and answers questions appropriately. Complaining of significant pain "all over". Endorses abdominal pain but denies nausea/vomiting.   TMAX: 101.8 at 0045, 99.3 @ 0529 Sinus tachycardia  102-109 bpm Objective: Vital signs in last 24 hours: Temp:  [99.3 F (37.4 C)-101.8 F (38.8 C)] 99.3 F (37.4 C) (05/04 0529) Pulse Rate:  [99-109] 102 (05/04 0529) Resp:  [16-18] 18 (05/04 0529) BP: (122-152)/(53-79) 129/66 (05/04 0529) SpO2:  [95 %-100 %] 95 % (05/04 0529) Last BM Date: 12/20/16  Intake/Output from previous day: 05/03 0701 - 05/04 0700 In: 1308.8 [I.V.:1208.8; IV Piggyback:100] Out: 1200 [Urine:1200] Intake/Output this shift: No intake/output data recorded.  PE: Gen:  Somnolent but arousable, NAD, pleasant Card:  Sinus tachycardia, pedal pulses 2+ BL Pulm:  Normal effort, clear to auscultation bilaterally Abd: Soft, TTP RUQ with guarding, hypoactive BS, previous surgical scar noted Skin: warm and dry, no rashes  Psych: A&Ox3  Neuro: following commands, no focal deficits   Lab Results:   Recent Labs  12/20/16 0414 12/21/16 0443  WBC 18.3* 14.8*  HGB 11.1* 10.2*  HCT 32.7* 29.9*  PLT 204 182   BMET  Recent Labs  12/20/16 0414 12/21/16 0443  NA 126* 128*  K 3.9 3.6  CL 93* 94*  CO2 24 24  GLUCOSE 122* 109*  BUN 13 12  CREATININE 0.80 0.76  CALCIUM 8.5* 8.1*   PT/INR No results for input(s): LABPROT, INR in the last 72 hours. CMP     Component Value Date/Time   NA 128 (L) 12/21/2016 0443   NA 121 (L) 12/14/2016 1050   K 3.6 12/21/2016 0443   CL 94 (L) 12/21/2016 0443   CO2 24 12/21/2016 0443   GLUCOSE 109 (H) 12/21/2016 0443   BUN 12 12/21/2016 0443   BUN 12 12/14/2016 1050   CREATININE 0.76 12/21/2016 0443   CALCIUM 8.1 (L) 12/21/2016 0443   PROT 5.8 (L) 12/21/2016 0443   ALBUMIN 2.0 (L) 12/21/2016 0443   AST 64 (H) 12/21/2016 0443    ALT 79 (H) 12/21/2016 0443   ALKPHOS 124 12/21/2016 0443   BILITOT 0.7 12/21/2016 0443   GFRNONAA >60 12/21/2016 0443   GFRAA >60 12/21/2016 0443   Lipase     Component Value Date/Time   LIPASE 19 01/01/2010 1018       Studies/Results: Ct Abdomen Pelvis W Contrast  Result Date: 12/20/2016 CLINICAL DATA:  Abdominal pain.  Leukocytosis and peritonitis. EXAM: CT ABDOMEN AND PELVIS WITH CONTRAST TECHNIQUE: Multidetector CT imaging of the abdomen and pelvis was performed using the standard protocol following bolus administration of intravenous contrast. CONTRAST:  146mL ISOVUE-300 IOPAMIDOL (ISOVUE-300) INJECTION 61% COMPARISON:  October 03, 2010 FINDINGS: Lower chest: Mild atelectasis in the bases of the lungs. Lung bases are otherwise normal. Hepatobiliary: The gallbladder is distended with wall thickening, adjacent pericholecystic fluid, and adjacent fat stranding. No intra or extrahepatic biliary duct dilatation seen. No focal hepatic masses are seen. Portal vein is patent. Pancreas: Unremarkable. No pancreatic ductal dilatation or surrounding inflammatory changes. Spleen: Normal in size without focal abnormality. Adrenals/Urinary Tract: Adrenal glands are normal. A cyst is seen in the left kidney. Smaller cysts are seen in the right kidney. No hydronephrosis or perinephric stranding. No ureterectasis or ureteral stones. The bladder is decompressed with a Foley catheter. Stomach/Bowel: The patient is status post gastric surgery with a gastrojejunostomy identified. Remaining duodenum loop is anastomosed to the biliary  tree. The fat stranding from the gallbladder also surrounds the proximal duodenum. Small bowel is otherwise normal. The colon is unremarkable. The appendix is not seen but there is no secondary evidence of appendicitis. Vascular/Lymphatic: Atherosclerotic changes are seen in the non aneurysmal abdominal aorta and iliac vessels. No adenopathy. Reproductive: Prostate is unremarkable.  Other: No abdominal wall hernia or abnormality. No abdominopelvic ascites. Musculoskeletal: Postsurgical changes are seen in the lower lumbar spine. No acute bony abnormalities are identified. IMPRESSION: 1. The distended gallbladder with wall thickening, adjacent fat stranding, and adjacent fluid is consistent with acute cholecystitis, likely the cause of the patient's symptoms. 2. No other acute abnormalities.  Atherosclerosis. These results will be called to the ordering clinician or representative by the Radiologist Assistant, and communication documented in the PACS or zVision Dashboard. Electronically Signed   By: Dorise Bullion III M.D   On: 12/20/2016 14:16   US Abdomen Limited Ruq  Result Date: 12/20/2016 CLINICAL DATA:  Follow-up from CT earlier today. EXAM: US ABDOMEN LIMITED - RIGHT UPPER QUADRANT COMPARISON:  CT 12/20/2016 FINDINGS: Gallbladder: Gallbladder is distended. Gallbladder wall is thickened, 5.3 mm. No sonographic Murphy's sign. There is pericholecystic fluid. Tumefactive sludge is present in the gallbladder neck. No stones are identified. Common bile duct: Diameter: 3.8 mm Liver: The liver is heterogeneous and echogenic appearance. No focal liver lesions are identified. IMPRESSION: 1. The findings are nonspecific but suspicious for acute cholecystitis. 2. Heterogeneous appearance of the liver. Electronically Signed   By: Nolon Nations M.D.   On: 12/20/2016 16:31    Anti-infectives: Anti-infectives    Start     Dose/Rate Route Frequency Ordered Stop   12/20/16 1600  piperacillin-tazobactam (ZOSYN) IVPB 3.375 g     3.375 g 12.5 mL/hr over 240 Minutes Intravenous Every 8 hours 12/20/16 1454     12/16/16 1230  demeclocycline (DECLOMYCIN) tablet 150 mg  Status:  Discontinued     150 mg Oral Every 12 hours 12/16/16 1148 12/19/16 0944     Assessment/Plan Acalculous cholecystitis - febrile early this AM - AST 64, ALT 79, t.bilirubin WNL - WBC 14.8 from 18.3, continue IV abx -  recommend IR cholecystostomy tube placement, IR may require HIDA scan prior to procedure    TBI Fall Diabetes mellitus HTN GERD Moderate protein malnutrition  FEN: NPO, chronic hyponatremia ID: Zosyn 5/3>> VTE: SCD's    LOS: 6 days    Jill Alexanders , [facility] 12/21/2016, 8:33 AM Pager: [phone] Consults: [phone] Mon-Fri 7:00 am-4:30 pm Sat-Sun 7:00 am-11:30 am

## 2016-12-21 NOTE — Care Management Note (Signed)
Case Management Note  Patient Details  Name: Axel Frisk MRN: [id number] Date of Birth: [birth year]  Subjective/Objective:                    Action/Plan: Plan currently is for CIR once medically ready. CM following for d/c disposition.   Expected Discharge Date:                  Expected Discharge Plan:     In-House Referral:     Discharge planning Services     Post Acute Care Choice:    Choice offered to:     DME Arranged:    DME Agency:     HH Arranged:    HH Agency:     Status of Service:  In process, will continue to follow  If discussed at Long Length of Stay Meetings, dates discussed:    Additional Comments:  Pollie Friar, RN 12/21/2016, 3:35 PM

## 2016-12-21 NOTE — Progress Notes (Signed)
Internal Medicine Attending:   I saw and examined the patient. I reviewed the resident's note and I agree with the resident's findings and plan as documented in the resident's note.  71 year old man who is hospital day #6 with a subdural hematoma after a fall at home, hospital course has been complicated by development of acalculus cholecystitis over the last 3 days. We started antibiotics yesterday with Pip-Tazo. On exam this morning his mental status looks improved, he is more conversational and more awake. He reports having the same amount of abdominal pain. He's tender in the right upper quadrant with a Murphy sign. Also noted to have a fever this morning to 101.8 and improved WBC to 14k. He is stable but not improved, I agree with surgery recommendations for IR-guided percutaneous cholecystostomy tube.

## 2016-12-21 NOTE — Progress Notes (Signed)
Subjective: [name] appear to be in less discomfort today. He was better able to move and was positioned less ridgedly in bed. He did endorse pain in his abdomen that is similar to yesterday, but said that his head does not hurt as much as it has.   Objective: Vital signs in last 24 hours: Vitals:   12/20/16 1811 12/20/16 2111 12/21/16 0045 12/21/16 0529  BP: 133/66 (!) 125/53 122/62 129/66  Pulse: (!) 105 99 (!) 109 (!) 102  Resp: 18 16 18 18   Temp: 100.2 F (37.9 C) 99.9 F (37.7 C) (!) 101.8 F (38.8 C) 99.3 F (37.4 C)  TempSrc: Oral Oral Oral Oral  SpO2: 100% 95% 98% 95%  Weight:      Height:        Intake/Output Summary (Last 24 hours) at 12/21/16 0825 Last data filed at 12/21/16 1245  Gross per 24 hour  Intake          1308.75 ml  Output             1200 ml  Net           108.75 ml   Physical Exam: Constitutional: NAD Abd: bowel sounds faint but present, distention less evident than on prior exams, tenderness to palpation primarily in the RUQ with some diffuse tenderness  Studies/Results: Ct Abdomen Pelvis W Contrast  Result Date: 12/20/2016 CLINICAL DATA:  Abdominal pain.  Leukocytosis and peritonitis. EXAM: CT ABDOMEN AND PELVIS WITH CONTRAST TECHNIQUE: Multidetector CT imaging of the abdomen and pelvis was performed using the standard protocol following bolus administration of intravenous contrast. CONTRAST:  180mL ISOVUE-300 IOPAMIDOL (ISOVUE-300) INJECTION 61% COMPARISON:  October 03, 2010 FINDINGS: Lower chest: Mild atelectasis in the bases of the lungs. Lung bases are otherwise normal. Hepatobiliary: The gallbladder is distended with wall thickening, adjacent pericholecystic fluid, and adjacent fat stranding. No intra or extrahepatic biliary duct dilatation seen. No focal hepatic masses are seen. Portal vein is patent. Pancreas: Unremarkable. No pancreatic ductal dilatation or surrounding inflammatory changes. Spleen: Normal in size without focal abnormality.  Adrenals/Urinary Tract: Adrenal glands are normal. A cyst is seen in the left kidney. Smaller cysts are seen in the right kidney. No hydronephrosis or perinephric stranding. No ureterectasis or ureteral stones. The bladder is decompressed with a Foley catheter. Stomach/Bowel: The patient is status post gastric surgery with a gastrojejunostomy identified. Remaining duodenum loop is anastomosed to the biliary tree. The fat stranding from the gallbladder also surrounds the proximal duodenum. Small bowel is otherwise normal. The colon is unremarkable. The appendix is not seen but there is no secondary evidence of appendicitis. Vascular/Lymphatic: Atherosclerotic changes are seen in the non aneurysmal abdominal aorta and iliac vessels. No adenopathy. Reproductive: Prostate is unremarkable. Other: No abdominal wall hernia or abnormality. No abdominopelvic ascites. Musculoskeletal: Postsurgical changes are seen in the lower lumbar spine. No acute bony abnormalities are identified. IMPRESSION: 1. The distended gallbladder with wall thickening, adjacent fat stranding, and adjacent fluid is consistent with acute cholecystitis, likely the cause of the patient's symptoms. 2. No other acute abnormalities.  Atherosclerosis. These results will be called to the ordering clinician or representative by the Radiologist Assistant, and communication documented in the PACS or zVision Dashboard. Electronically Signed   By: Dorise Bullion III M.D   On: 12/20/2016 14:16   US Abdomen Limited Ruq  Result Date: 12/20/2016 CLINICAL DATA:  Follow-up from CT earlier today. EXAM: US ABDOMEN LIMITED - RIGHT UPPER QUADRANT COMPARISON:  CT 12/20/2016 FINDINGS: Gallbladder:  Gallbladder is distended. Gallbladder wall is thickened, 5.3 mm. No sonographic Murphy's sign. There is pericholecystic fluid. Tumefactive sludge is present in the gallbladder neck. No stones are identified. Common bile duct: Diameter: 3.8 mm Liver: The liver is heterogeneous  and echogenic appearance. No focal liver lesions are identified. IMPRESSION: 1. The findings are nonspecific but suspicious for acute cholecystitis. 2. Heterogeneous appearance of the liver. Electronically Signed   By: Nolon Nations M.D.   On: 12/20/2016 16:31   Assessment/Plan: Active Problems   Acalculous cholecystitis   SDH  At risk for altered respiratory function  Acalculous cholecystitis: [name] was febrile to 101.56F yesterday, has had a white count for several days and yesterday began to show rebound, guarding and rigidity concerning for peritonitis. CT imaging obtained showed a distended gallbladder and a subsequent RUQ U/S confirmed dilation of the gallbladder with wall thickening, showed non-dilated intra and extra-biliary ducts and did not reveal any stones. The RUQ abdominal pain combined with the dilated gallbladder devoid of stones or ductal dilation on imaging, fever and elevated WBC count suggests a diagnosis of Acalculous Cholecystitis.     --NPO --piperacillin-tazobactam 3.375g Q8  --place percutaneous drain if symptoms fail to improve --surgery following for removal of gallbladder when metabolic statis improves   SDH: Mr. Boughner has not had any acute neurological changes over the last several days and remains able to respond to questions through an interpreter. His lack of neurological changes suggest that his SDH is currently stable.  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 Today his Na was up from previous values at 128 and consistent with his baseline. He has been maintained NPO for concerns of illeus, which takes care of the fluid restriction necessary to treat SIADH. Further attempts at management will have to wait for the patient to tollerate PO intake, but could be prudent cvonsidering this disorder's possible  causative role in the patients fall.  --Fluid restriction, NPO now --follow Bmet in AM   HTN:  Recommended to maintain SBP <140 due to recent history of SDH. So far he has needed PRN hydralazine for pressures >140.  --Maintain SBP <140 --Hydralazine 10mg  q4hr PRN   T2DM:  --holding home Metformin   VTE ppx: SCDs Diet: NPO Code: FULL    This is a Careers information officer Note.  The care of the patient was discussed with Dr. Jari Favre and the assessment and plan formulated with their assistance.  Please see their attached note for official documentation of the daily encounter.   LOS: 6 days   Cyndie Mull, Medical Student 12/21/2016, 8:25 AM

## 2016-12-21 NOTE — Clinical Social Work Note (Signed)
CSW discussed interest in bringing patient's daughter over from [place] to care for him until he is able to live alone again with family friend, Elizabeth Palau [phone]). She advised that CSW call the [employer]. CSW confirmed that this is okay with patient's son Arlon. CSW left voicemail for staff at the [employer] that connect families. CSW will provide patient's son with the information once obtained.  [name], [name]

## 2016-12-21 NOTE — Procedures (Signed)
Interventional Radiology Procedure Note  Procedure:  Percutaneous cholecystostomy tube placement  Complications: None  Estimated Blood Loss: < 10 mL  GB lumen with gross pus; sample sent for culture.  10 Fr percutaneous drain placed and attached to gravity bag drainage.  Venetia Night. Kathlene Cote, M.D Pager:  [phone]

## 2016-12-21 NOTE — Progress Notes (Signed)
   Subjective:  Patient states his abdominal pain is unchanged from yesterday. He states that his headache is not as bothersome anymore though still lingering.   No episodes of vomiting or bowel movements recorded overnight.  Objective:  Vital signs in last 24 hours: Vitals:   12/20/16 1811 12/20/16 2111 12/21/16 0045 12/21/16 0529  BP: 133/66 (!) 125/53 122/62 129/66  Pulse: (!) 105 99 (!) 109 (!) 102  Resp: 18 16 18 18   Temp: 100.2 F (37.9 C) 99.9 F (37.7 C) (!) 101.8 F (38.8 C) 99.3 F (37.4 C)  TempSrc: Oral Oral Oral Oral  SpO2: 100% 95% 98% 95%  Weight:      Height:       Constitutional: ill appearing elderly gentleman in mild distress CV: RRR, no murmurs, rubs or gallops appreciated, pulses intact, no LE edema, warm extremities Resp: CTAB Abd: diminished bowel sounds, distended, soft, focal RUQ tenderness, no rebound tenderness, no guarding  Assessment/Plan:  Active Problems:   SDH (subdural hematoma) (HCC)   At risk for altered respiratory function  Acalculous cholecystitis; Peritonitis:  Some improvement in exam today though per patient unchanged. Patient continues to spike feversSurgery has been following and recommend IR placed cholecystostomy drain at this time. 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 AM Na 128. --fluid restriction --D5NS - stop once tolerating diet --follow AM Bmet  HTN: --Maintain SBP <140 --hold  home lisinopril --Hydralazine 10mg  q4hr PRN for now; will restart oral meds when able  T2DM: Hgb A1c 6.1; home med is metformin. --hold metformin --CBG qac/wc --SSI  VTE ppx: SCDs Diet: NPO Code: FULL  Dispo: Anticipated discharge in approximately 4-5 day(s).   Alphonzo Grieve, MD 12/21/2016, 8:22 AM Pager [phone]

## 2016-12-22 ENCOUNTER — Inpatient Hospital Stay (HOSPITAL_COMMUNITY): Payer: Medicare Other

## 2016-12-22 LAB — GLUCOSE, CAPILLARY
GLUCOSE-CAPILLARY: 107 mg/dL — AB (ref 65–99)
GLUCOSE-CAPILLARY: 126 mg/dL — AB (ref 65–99)
GLUCOSE-CAPILLARY: 101 mg/dL — AB (ref 65–99)
Glucose-Capillary: 111 mg/dL — ABNORMAL HIGH (ref 65–99)

## 2016-12-22 LAB — CBC
HCT: 28.6 % — ABNORMAL LOW (ref 39.0–52.0)
HEMOGLOBIN: 9.6 g/dL — AB (ref 13.0–17.0)
MCH: 27.5 pg (ref 26.0–34.0)
MCHC: 33.6 g/dL (ref 30.0–36.0)
MCV: 81.9 fL (ref 78.0–100.0)
Platelets: 186 10*3/uL (ref 150–400)
RBC: 3.49 MIL/uL — ABNORMAL LOW (ref 4.22–5.81)
RDW: 19.2 % — ABNORMAL HIGH (ref 11.5–15.5)
WBC: 9.5 10*3/uL (ref 4.0–10.5)

## 2016-12-22 LAB — BASIC METABOLIC PANEL
ANION GAP: 12 (ref 5–15)
BUN: 17 mg/dL (ref 6–20)
CALCIUM: 8 mg/dL — AB (ref 8.9–10.3)
CO2: 21 mmol/L — AB (ref 22–32)
Chloride: 98 mmol/L — ABNORMAL LOW (ref 101–111)
Creatinine, Ser: 0.74 mg/dL (ref 0.61–1.24)
GFR calc Af Amer: >60 mL/min (ref >60)
GFR calc non Af Amer: >60 mL/min (ref >60)
GLUCOSE: 88 mg/dL (ref 65–99)
POTASSIUM: 3.6 mmol/L (ref 3.5–5.1)
Sodium: 131 mmol/L — ABNORMAL LOW (ref 135–145)

## 2016-12-22 MED ORDER — POLYETHYLENE GLYCOL 3350 17 G PO PACK
17.0000 g | PACK | Freq: Every day | ORAL | Status: DC
  Administered 2016-12-22: 17 g via ORAL
  Filled 2016-12-22: qty 1

## 2016-12-22 MED ORDER — SENNOSIDES-DOCUSATE SODIUM 8.6-50 MG PO TABS
1.0000 | ORAL_TABLET | Freq: Two times a day (BID) | ORAL | Status: AC
  Administered 2016-12-22 (×2): 1 via ORAL
  Filled 2016-12-22 (×2): qty 1

## 2016-12-22 NOTE — Progress Notes (Signed)
Subjective: [name] was more talkative and comfortable appearing today. Yesterday he had a Percutaneous Cholecystostomy drain placed. Initially the output was reported as grossly purulent. However this morning it appeared more sero-sanguinous. [name] mentioned abdominal pain on the left side of his abdomen this morning that is different from the pain he was experiencing before his cholecystostomy drain was placed. This pain has been getting worse since it's onset last night. He has still not had a bowel movement. He was able to attempt to eat today.      Objective: Vital signs in last 24 hours: Vitals:   12/22/16 0051 12/22/16 0504 12/22/16 0659 12/22/16 0924  BP: 112/61 121/65 126/67 114/60  Pulse: 88 88 64 92  Resp: 18 18 18 20   Temp: 98.9 F (37.2 C) 97.6 F (36.4 C) 98 F (36.7 C) 98.6 F (37 C)  TempSrc: Oral Oral Oral Oral  SpO2: 95% 95% 97% 96%  Weight:      Height:        Intake/Output Summary (Last 24 hours) at 12/22/16 1215 Last data filed at 12/22/16 0934  Gross per 24 hour  Intake             1180 ml  Output             1550 ml  Net             -370 ml   Physical Exam:  General: Well appearing, in some discomfort  Abd: tender to palpation in L-upper and lower quadrants, moderately distended, no rebound or guarding, drain output sero-sanginous   Assessment/Plan: Active Problems:   SDH (subdural hematoma) (HCC)   At risk for altered respiratory function  Acalculous cholecystitis/ Abdominal Pain: After having a percutaneous cholecystostomy drain placed, [name] was afebrile this morning, his white count had decreased to within normal ranges after being elevated for several days, and he did not display rebound or guarding on exam. These features suggest that he does not have active peritonitis. However, he did complain of increased pain on the left side of his abdomen this morning. He stated that this pain has worsened since it's onset last night.  The new onset abdominal pain can most likely be attributed to [name]'s constipation and is less likely a perforation or continued peritonitis due to lack of peritoneal signs and elevated white count. However, the pain that is gradually worsening over time still warrants further investigation.   --KUB   --continue piperacillin-tazobactam 3.375g Q8  --percutaneous drain placed- monitor output  --surgery following for removal of gallbladder when metabolic statis improves   SDH: [name] has not had any acute neurological changes over the last several days and remains able to respond to questions through an interpreter. His lack of neurological changes suggest that his SDH is currently stable.  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 Today his Na was up from previous values at 131. He has been maintained NPO for concerns of illeus, which takes care of the fluid restriction necessary to treat SIADH. Further attempts at management will have to wait for the patient to tollerate PO intake, but could be prudent cvonsidering this disorder's possible causative role in the patients fall.  --Fluid restriction,  --follow Bmet in AM   HTN:  Recommended to maintain SBP <140 due to recent history of SDH. So far he  has needed PRN hydralazine for pressures >140.  --Maintain SBP <140 --Hydralazine 10mg  q4hr PRN   VTE ppx: SCDs Diet: As tolerated  Code: FULL  This is a Careers information officer Note.  The care of the patient was discussed with Dr. Jari Favre and the assessment and plan formulated with their assistance.  Please see their attached note for official documentation of the daily encounter.   LOS: 7 days   Cyndie Mull, Medical Student 12/22/2016, 12:15 PM

## 2016-12-22 NOTE — Progress Notes (Signed)
Referring Physician(s): Dr. Verita Lamb  Supervising Physician: Aletta Edouard  Patient Status:  [facility] - In-pt  Chief Complaint: Acute cholecystitis s/p perc chole 5/4  Subjective: Pt s/p perc chole drain yesterday. Per report, grossly purulent output at the time of drain placement. Pt still having upper abd pain, as well as some newer left sided abd pain Sore at drain site. No N/V, not much bowel activity  Allergies: Patient has no known allergies.  Medications:  Current Facility-Administered Medications:  .  hydrALAZINE (APRESOLINE) injection 10 mg, 10 mg, Intravenous, Q4H PRN, Alphonzo Grieve, MD, 10 mg at 12/19/16 0401 .  levETIRAcetam (KEPPRA) 500 mg in sodium chloride 0.9 % 100 mL IVPB, 500 mg, Intravenous, Q12H, Alphonzo Grieve, MD, Stopped at 12/22/16 0522 .  piperacillin-tazobactam (ZOSYN) IVPB 3.375 g, 3.375 g, Intravenous, Q8H, [name], MD, Stopped at 12/22/16 0930 .  polyethylene glycol (MIRALAX / GLYCOLAX) packet 17 g, 17 g, Oral, QHS, Svalina, Gorica, MD  (PHENERGAN) injection 12.5 mg, 12.5 mg, Intravenous, Q6H PRN, Collier Salina, MD, 12.5 mg at 12/18/16 1152 .  senna-docusate (Senokot-S) tablet 1 tablet, 1 tablet, Oral, BID, Svalina, Gorica, MD .  sodium chloride flush (NS) 0.9 % injection 3 mL, 3 mL, Intravenous, Q12H, Rice, Resa Miner, MD, 3 mL at 12/21/16 2103    Vital Signs: BP 114/60 (BP Location: Left Arm)   Pulse 92   Temp 98.6 F (37 C) (Oral)   Resp 20   Ht 5\' 5"  (1.651 m)   Wt 155 lb (70.3 kg)   SpO2 96%   BMI 25.79 kg/m   Physical Exam General: Resting in bed, nontoxic appearing. Lungs: CTA without w/r/r Heart: Regular Abdomen:soft, no pertioneal signs, but some mildly tender (L)abdomen as well as RUQ Drain: intact, site clean. Output now more serous with some purulent stranding   Imaging: Ct Abdomen Pelvis W Contrast  Result Date: 12/20/2016 CLINICAL DATA:  Abdominal pain.  Leukocytosis and peritonitis. EXAM:  CT ABDOMEN AND PELVIS WITH CONTRAST TECHNIQUE: Multidetector CT imaging of the abdomen and pelvis was performed using the standard protocol following bolus administration of intravenous contrast. CONTRAST:  130mL ISOVUE-300 IOPAMIDOL (ISOVUE-300) INJECTION 61% COMPARISON:  October 03, 2010 FINDINGS: Lower chest: Mild atelectasis in the bases of the lungs. Lung bases are otherwise normal. Hepatobiliary: The gallbladder is distended with wall thickening, adjacent pericholecystic fluid, and adjacent fat stranding. No intra or extrahepatic biliary duct dilatation seen. No focal hepatic masses are seen. Portal vein is patent. Pancreas: Unremarkable. No pancreatic ductal dilatation or surrounding inflammatory changes. Spleen: Normal in size without focal abnormality. Adrenals/Urinary Tract: Adrenal glands are normal. A cyst is seen in the left kidney. Smaller cysts are seen in the right kidney. No hydronephrosis or perinephric stranding. No ureterectasis or ureteral stones. The bladder is decompressed with a Foley catheter. Stomach/Bowel: The patient is status post gastric surgery with a gastrojejunostomy identified. Remaining duodenum loop is anastomosed to the biliary tree. The fat stranding from the gallbladder also surrounds the proximal duodenum. Small bowel is otherwise normal. The colon is unremarkable. The appendix is not seen but there is no secondary evidence of appendicitis. Vascular/Lymphatic: Atherosclerotic changes are seen in the non aneurysmal abdominal aorta and iliac vessels. No adenopathy. Reproductive: Prostate is unremarkable. Other: No abdominal wall hernia or abnormality. No abdominopelvic ascites. Musculoskeletal: Postsurgical changes are seen in the lower lumbar spine. No acute bony abnormalities are identified. IMPRESSION: 1. The distended gallbladder with wall thickening, adjacent fat stranding, and adjacent fluid is consistent  with acute cholecystitis, likely the cause of the patient's  symptoms. 2. No other acute abnormalities.  Atherosclerosis. These results will be called to the ordering clinician or representative by the Radiologist Assistant, and communication documented in the PACS or zVision Dashboard. Electronically Signed   By: Dorise Bullion III M.D   On: 12/20/2016 14:16   Ir Perc Cholecystostomy  Result Date: 12/21/2016 CLINICAL DATA:  Acute cholecystitis and poor candidate currently for cholecystectomy. Request has been made to place a cholecystostomy tube. EXAM: PERCUTANEOUS CHOLECYSTOSTOMY COMPARISON:  Ultrasound and CT studies on 12/20/2016 ANESTHESIA/SEDATION: 1.0 mg IV Versed; 25 mcg IV Fentanyl. Total Moderate Sedation Time 16 minutes. The patient's level of consciousness and physiologic status were continuously monitored during the procedure by Radiology nursing. CONTRAST:  10 mL Isovue-300 MEDICATIONS: A scheduled dose of 3.375 g IV Zosyn was being administered during the procedure. FLUOROSCOPY TIME:  54 seconds.  6.8 mGy. PROCEDURE: The procedure, risks, benefits, and alternatives were explained to the patient. Questions regarding the procedure were encouraged and answered. The patient understands and consents to the procedure. The right abdominal wall was prepped with chlorhexidine in a sterile fashion, and a sterile drape was applied covering the operative field. A sterile gown and sterile gloves were used for the procedure. Local anesthesia was provided with 1% Lidocaine. Ultrasound image documentation was performed. Fluoroscopy during the procedure and fluoro spot radiograph confirms appropriate catheter position. Ultrasound was utilized to localize the gallbladder. Under direct ultrasound guidance, a 21 gauge needle was advanced via a transhepatic approach into the gallbladder lumen. Aspiration was performed and a bile sample sent for culture studies. A small amount of diluted contrast material was injected. A guide wire was then advanced into the gallbladder. A  transitional dilator was placed. Percutaneous tract dilatation was then performed over a guide wire to 10-French. A 10-French pigtail drainage catheter was then advanced into the gallbladder lumen under fluoroscopy. Catheter was formed and injected with contrast material to confirm position. The catheter was flushed and connected to a gravity drainage bag. It was secured at the skin with a Prolene retention suture and Stat-Lock device. COMPLICATIONS: None FINDINGS: After needle puncture of the gallbladder there was return of grossly purulent fluid from the gallbladder lumen. A sample was sent for culture analysis. The cholecystostomy tube was advanced into the gallbladder lumen and formed. It is now draining well. This tube will be left to gravity drainage. IMPRESSION: Percutaneous cholecystostomy with placement of 10-French drainage catheter into the gallbladder lumen. This was left to gravity drainage. There was gross pus within the gallbladder lumen and a sample was sent for culture analysis. Electronically Signed   By: Aletta Edouard M.D.   On: 12/21/2016 15:35   Dg Abd Acute W/chest  Result Date: 12/18/2016 CLINICAL DATA:  Abdominal pain. EXAM: DG ABDOMEN ACUTE W/ 1V CHEST COMPARISON:  CT 11/16/2016. KUB 02/29/2016. Chest x-ray 02/29/2016. FINDINGS: Mild cardiomegaly. Low lung volumes with mild basilar atelectasis. Mild basilar infiltrates cannot be excluded. Soft tissues the abdomen are unremarkable. Distended loops of small bowel are noted. Colonic gas pattern is normal. Stool in the colon. Findings suggest adynamic ileus. To exclude small bowel obstruction follow-up abdominal exam is suggested. No free air. Pelvic calcifications consistent phleboliths. Prior lumbar spine fusion. IMPRESSION: 1. Mild cardiomegaly. Low lung volumes with mild basilar atelectasis. Mild basilar infiltrates cannot be excluded. 2. Distended loops of small bowel. Colonic gas pattern is normal. Stool in colon. Findings suggest  adynamic ileus. Follow-up abdominal series suggested to exclude  small-bowel obstruction . No free air . Electronically Signed   By: [place]   On: 12/18/2016 16:29   US Abdomen Limited Ruq  Result Date: 12/20/2016 CLINICAL DATA:  Follow-up from CT earlier today. EXAM: US ABDOMEN LIMITED - RIGHT UPPER QUADRANT COMPARISON:  CT 12/20/2016 FINDINGS: Gallbladder: Gallbladder is distended. Gallbladder wall is thickened, 5.3 mm. No sonographic Murphy's sign. There is pericholecystic fluid. Tumefactive sludge is present in the gallbladder neck. No stones are identified. Common bile duct: Diameter: 3.8 mm Liver: The liver is heterogeneous and echogenic appearance. No focal liver lesions are identified. IMPRESSION: 1. The findings are nonspecific but suspicious for acute cholecystitis. 2. Heterogeneous appearance of the liver. Electronically Signed   By: Nolon Nations M.D.   On: 12/20/2016 16:31    Labs:  CBC:  Recent Labs  12/19/16 0632 12/20/16 0414 12/21/16 0443 12/22/16 0358  WBC 20.3* 18.3* 14.8* 9.5  HGB 12.3* 11.1* 10.2* 9.6*  HCT 35.7* 32.7* 29.9* 28.6*  PLT 219 204 182 186    COAGS:  Recent Labs  02/22/16 0953 12/15/16 0728 12/21/16 1052  INR 0.95 1.01 1.20  APTT 27  --   --     BMP:  Recent Labs  12/19/16 0632 12/20/16 0414 12/21/16 0443 12/22/16 0358  NA 124* 126* 128* 131*  K 4.2 3.9 3.6 3.6  CL 90* 93* 94* 98*  CO2 25 24 24  21*  GLUCOSE 112* 122* 109* 88  BUN 11 13 12 17   CALCIUM 8.8* 8.5* 8.1* 8.0*  CREATININE 0.73 0.80 0.76 0.74  GFRNONAA >60 >60 >60 >60  GFRAA >60 >60 >60 >60    LIVER FUNCTION TESTS:  Recent Labs  12/16/16 0220 12/17/16 0236 12/20/16 0414 12/21/16 0443  BILITOT 1.3* 1.3* 1.0 0.7  AST 33 32 49* 64*  ALT 32 34 70* 79*  ALKPHOS 65 62 104 124  PROT 6.9 7.1 6.2* 5.8*  ALBUMIN 3.6 3.7 2.6* 2.0*    Assessment and Plan: Cholecystitis s/p per chole drain. WBC and Temp down. Will cont to follow  Electronically  Signed: Ascencion Dike 12/22/2016, 10:26 AM   I spent a total of 15 Minutes at the the patient's bedside AND on the patient's hospital floor or unit, greater than 50% of which was counseling/coordinating care for per chole drain

## 2016-12-22 NOTE — Progress Notes (Signed)
Patient is doing much better is much more alert, son is assisting with communication, patient is eager to ambulate.

## 2016-12-22 NOTE — Progress Notes (Signed)
[facility] Progress Note     Subjective: CC: RUQ pain  Patient awake and alert. Feels distended, says pain is not much better   Afebrile  Objective: Vital signs in last 24 hours: Temp:  [97.6 F (36.4 C)-99.2 F (37.3 C)] 98 F (36.7 C) (05/05 0659) Pulse Rate:  [64-105] 64 (05/05 0659) Resp:  [18-23] 18 (05/05 0659) BP: (106-134)/(51-77) 126/67 (05/05 0659) SpO2:  [94 %-100 %] 97 % (05/05 0659) Last BM Date: 12/20/16  Intake/Output from previous day: 05/04 0701 - 05/05 0700 In: 1175 [P.O.:240; IV Piggyback:885] Out: 1800 [Urine:1700; Drains:100] Intake/Output this shift: No intake/output data recorded.  PE: Gen:  Alert, NAD, pleasant. Evolving ecchymoses to face Card:  Sinus tachycardia, pedal pulses 2+ BL Pulm:  Normal effort, symmetrical air entry Abd: Soft, mildly tender aronud RUQ drain which has serous output, previous surgical scar noted Skin: warm and dry, no rashes  Psych: A&Ox3  Neuro: following commands, no focal deficits   Lab Results:   Recent Labs  12/21/16 0443 12/22/16 0358  WBC 14.8* 9.5  HGB 10.2* 9.6*  HCT 29.9* 28.6*  PLT 182 186   BMET  Recent Labs  12/21/16 0443 12/22/16 0358  NA 128* 131*  K 3.6 3.6  CL 94* 98*  CO2 24 21*  GLUCOSE 109* 88  BUN 12 17  CREATININE 0.76 0.74  CALCIUM 8.1* 8.0*   PT/INR  Recent Labs  12/21/16 1052  LABPROT 15.3*  INR 1.20   CMP     Component Value Date/Time   NA 131 (L) 12/22/2016 0358   NA 121 (L) 12/14/2016 1050   K 3.6 12/22/2016 0358   CL 98 (L) 12/22/2016 0358   CO2 21 (L) 12/22/2016 0358   GLUCOSE 88 12/22/2016 0358   BUN 17 12/22/2016 0358   BUN 12 12/14/2016 1050   CREATININE 0.74 12/22/2016 0358   CALCIUM 8.0 (L) 12/22/2016 0358   PROT 5.8 (L) 12/21/2016 0443   ALBUMIN 2.0 (L) 12/21/2016 0443   AST 64 (H) 12/21/2016 0443   ALT 79 (H) 12/21/2016 0443   ALKPHOS 124 12/21/2016 0443   BILITOT 0.7 12/21/2016 0443   GFRNONAA >60 12/22/2016 0358   GFRAA >60  12/22/2016 0358   Lipase     Component Value Date/Time   LIPASE 19 01/01/2010 1018       Studies/Results: Ct Abdomen Pelvis W Contrast  Result Date: 12/20/2016 CLINICAL DATA:  Abdominal pain.  Leukocytosis and peritonitis. EXAM: CT ABDOMEN AND PELVIS WITH CONTRAST TECHNIQUE: Multidetector CT imaging of the abdomen and pelvis was performed using the standard protocol following bolus administration of intravenous contrast. CONTRAST:  132mL ISOVUE-300 IOPAMIDOL (ISOVUE-300) INJECTION 61% COMPARISON:  October 03, 2010 FINDINGS: Lower chest: Mild atelectasis in the bases of the lungs. Lung bases are otherwise normal. Hepatobiliary: The gallbladder is distended with wall thickening, adjacent pericholecystic fluid, and adjacent fat stranding. No intra or extrahepatic biliary duct dilatation seen. No focal hepatic masses are seen. Portal vein is patent. Pancreas: Unremarkable. No pancreatic ductal dilatation or surrounding inflammatory changes. Spleen: Normal in size without focal abnormality. Adrenals/Urinary Tract: Adrenal glands are normal. A cyst is seen in the left kidney. Smaller cysts are seen in the right kidney. No hydronephrosis or perinephric stranding. No ureterectasis or ureteral stones. The bladder is decompressed with a Foley catheter. Stomach/Bowel: The patient is status post gastric surgery with a gastrojejunostomy identified. Remaining duodenum loop is anastomosed to the biliary tree. The fat stranding from the gallbladder also surrounds the proximal  duodenum. Small bowel is otherwise normal. The colon is unremarkable. The appendix is not seen but there is no secondary evidence of appendicitis. Vascular/Lymphatic: Atherosclerotic changes are seen in the non aneurysmal abdominal aorta and iliac vessels. No adenopathy. Reproductive: Prostate is unremarkable. Other: No abdominal wall hernia or abnormality. No abdominopelvic ascites. Musculoskeletal: Postsurgical changes are seen in the lower  lumbar spine. No acute bony abnormalities are identified. IMPRESSION: 1. The distended gallbladder with wall thickening, adjacent fat stranding, and adjacent fluid is consistent with acute cholecystitis, likely the cause of the patient's symptoms. 2. No other acute abnormalities.  Atherosclerosis. These results will be called to the ordering clinician or representative by the Radiologist Assistant, and communication documented in the PACS or zVision Dashboard. Electronically Signed   By: Dorise Bullion III M.D   On: 12/20/2016 14:16   Ir Perc Cholecystostomy  Result Date: 12/21/2016 CLINICAL DATA:  Acute cholecystitis and poor candidate currently for cholecystectomy. Request has been made to place a cholecystostomy tube. EXAM: PERCUTANEOUS CHOLECYSTOSTOMY COMPARISON:  Ultrasound and CT studies on 12/20/2016 ANESTHESIA/SEDATION: 1.0 mg IV Versed; 25 mcg IV Fentanyl. Total Moderate Sedation Time 16 minutes. The patient's level of consciousness and physiologic status were continuously monitored during the procedure by Radiology nursing. CONTRAST:  10 mL Isovue-300 MEDICATIONS: A scheduled dose of 3.375 g IV Zosyn was being administered during the procedure. FLUOROSCOPY TIME:  54 seconds.  6.8 mGy. PROCEDURE: The procedure, risks, benefits, and alternatives were explained to the patient. Questions regarding the procedure were encouraged and answered. The patient understands and consents to the procedure. The right abdominal wall was prepped with chlorhexidine in a sterile fashion, and a sterile drape was applied covering the operative field. A sterile gown and sterile gloves were used for the procedure. Local anesthesia was provided with 1% Lidocaine. Ultrasound image documentation was performed. Fluoroscopy during the procedure and fluoro spot radiograph confirms appropriate catheter position. Ultrasound was utilized to localize the gallbladder. Under direct ultrasound guidance, a 21 gauge needle was advanced via a  transhepatic approach into the gallbladder lumen. Aspiration was performed and a bile sample sent for culture studies. A small amount of diluted contrast material was injected. A guide wire was then advanced into the gallbladder. A transitional dilator was placed. Percutaneous tract dilatation was then performed over a guide wire to 10-French. A 10-French pigtail drainage catheter was then advanced into the gallbladder lumen under fluoroscopy. Catheter was formed and injected with contrast material to confirm position. The catheter was flushed and connected to a gravity drainage bag. It was secured at the skin with a Prolene retention suture and Stat-Lock device. COMPLICATIONS: None FINDINGS: After needle puncture of the gallbladder there was return of grossly purulent fluid from the gallbladder lumen. A sample was sent for culture analysis. The cholecystostomy tube was advanced into the gallbladder lumen and formed. It is now draining well. This tube will be left to gravity drainage. IMPRESSION: Percutaneous cholecystostomy with placement of 10-French drainage catheter into the gallbladder lumen. This was left to gravity drainage. There was gross pus within the gallbladder lumen and a sample was sent for culture analysis. Electronically Signed   By: Aletta Edouard M.D.   On: 12/21/2016 15:35   US Abdomen Limited Ruq  Result Date: 12/20/2016 CLINICAL DATA:  Follow-up from CT earlier today. EXAM: US ABDOMEN LIMITED - RIGHT UPPER QUADRANT COMPARISON:  CT 12/20/2016 FINDINGS: Gallbladder: Gallbladder is distended. Gallbladder wall is thickened, 5.3 mm. No sonographic Murphy's sign. There is pericholecystic fluid. Tumefactive sludge  is present in the gallbladder neck. No stones are identified. Common bile duct: Diameter: 3.8 mm Liver: The liver is heterogeneous and echogenic appearance. No focal liver lesions are identified. IMPRESSION: 1. The findings are nonspecific but suspicious for acute cholecystitis. 2.  Heterogeneous appearance of the liver. Electronically Signed   By: Nolon Nations M.D.   On: 12/20/2016 16:31    Anti-infectives: Anti-infectives    Start     Dose/Rate Route Frequency Ordered Stop   12/20/16 1600  piperacillin-tazobactam (ZOSYN) IVPB 3.375 g     3.375 g 12.5 mL/hr over 240 Minutes Intravenous Every 8 hours 12/20/16 1454     12/16/16 1230  demeclocycline (DECLOMYCIN) tablet 150 mg  Status:  Discontinued     150 mg Oral Every 12 hours 12/16/16 1148 12/19/16 0944     Assessment/Plan Acalculous cholecystitis s/p PCT 5/4 - Afebrile - WBC downtrending    TBI Fall Diabetes mellitus HTN GERD Moderate protein malnutrition  FEN: carb counting diet, chronic hyponatremia ID: Zosyn 5/3>> VTE: SCD's   Will continue to follow.    LOS: 7 days    [facility] 12/22/2016, 8:18 AM

## 2016-12-22 NOTE — Progress Notes (Signed)
   Subjective:  When first evaluated this morning, patient was sitting up in chair eating banana and drinking coffee; he reported some right sided abdominal pain. He reported that he was tolerating food well, had no nausea, vomiting, and was relieved he was able to walk around.  Nursing called back to bedside later on with patient complaints of 10/10 left sided abdominal pain. Patient endorsed some nausea, no vomiting and no BMs.   Objective:  Vital signs in last 24 hours: Vitals:   12/22/16 0051 12/22/16 0504 12/22/16 0659 12/22/16 0924  BP: 112/61 121/65 126/67 114/60  Pulse: 88 88 64 92  Resp: 18 18 18 20   Temp: 98.9 F (37.2 C) 97.6 F (36.4 C) 98 F (36.7 C) 98.6 F (37 C)  TempSrc: Oral Oral Oral Oral  SpO2: 95% 95% 97% 96%  Weight:      Height:       Constitutional: chronically ill appearing gentleman CV: RRR, no murmurs, rubs or gallops appreciated, pulses intact, no LE edema, warm extremities Resp: CTAB Abd: active bowel sounds, no guarding, mild tenderness in RUQ, mod tenderness on left abdomen, no rebound, soft abdomen.  Assessment/Plan:  Active Problems:   SDH (subdural hematoma) (HCC)   At risk for altered respiratory function  Acalculous cholecystitis:  s/p percutaneous drain; patient clinically much improved. He does have new left sided abdominal pain but otherwise no further fevers, WBC wnl, and no peritoneal signs anymore. It is likely that his new discomfort is due to stool burden, but will check KUB again.  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 In  outpatient setting, it did not respond to fluid restriction but is responding well inpatient - compliance was likely an issue.  AM Na 131. --fluid restriction --follow AM Bmet  HTN: --Maintain SBP <140 --hold home lisinopril --Hydralazine 10mg  q4hr PRN for now; will restart oral meds when able and needed but BPs stable now  T2DM: Hgb A1c 6.1; home med is metformin. --hold metformin --CBG qac/wc  VTE ppx: SCDs Diet: CM Code: FULL  Dispo: Anticipated discharge in approximately 4-5 day(s).   Alphonzo Grieve, MD 12/22/2016, 10:00 AM Pager [phone]

## 2016-12-23 DIAGNOSIS — Z79899 Other long term (current) drug therapy: Secondary | ICD-10-CM

## 2016-12-23 DIAGNOSIS — S065X9D Traumatic subdural hemorrhage with loss of consciousness of unspecified duration, subsequent encounter: Secondary | ICD-10-CM

## 2016-12-23 DIAGNOSIS — X58XXXD Exposure to other specified factors, subsequent encounter: Secondary | ICD-10-CM

## 2016-12-23 LAB — GLUCOSE, CAPILLARY
GLUCOSE-CAPILLARY: 126 mg/dL — AB (ref 65–99)
Glucose-Capillary: 105 mg/dL — ABNORMAL HIGH (ref 65–99)
Glucose-Capillary: 115 mg/dL — ABNORMAL HIGH (ref 65–99)
Glucose-Capillary: 99 mg/dL (ref 65–99)

## 2016-12-23 LAB — CBC
HCT: 32.7 % — ABNORMAL LOW (ref 39.0–52.0)
HEMOGLOBIN: 11.3 g/dL — AB (ref 13.0–17.0)
MCH: 28.1 pg (ref 26.0–34.0)
MCHC: 34.6 g/dL (ref 30.0–36.0)
MCV: 81.3 fL (ref 78.0–100.0)
Platelets: 242 10*3/uL (ref 150–400)
RBC: 4.02 MIL/uL — ABNORMAL LOW (ref 4.22–5.81)
RDW: 18.9 % — ABNORMAL HIGH (ref 11.5–15.5)
WBC: 8.5 10*3/uL (ref 4.0–10.5)

## 2016-12-23 LAB — BASIC METABOLIC PANEL
ANION GAP: 10 (ref 5–15)
BUN: 9 mg/dL (ref 6–20)
CHLORIDE: 96 mmol/L — AB (ref 101–111)
CO2: 24 mmol/L (ref 22–32)
Calcium: 8.4 mg/dL — ABNORMAL LOW (ref 8.9–10.3)
Creatinine, Ser: 0.72 mg/dL (ref 0.61–1.24)
GFR calc non Af Amer: >60 mL/min (ref >60)
GLUCOSE: 116 mg/dL — AB (ref 65–99)
Potassium: 3.3 mmol/L — ABNORMAL LOW (ref 3.5–5.1)
Sodium: 130 mmol/L — ABNORMAL LOW (ref 135–145)

## 2016-12-23 MED ORDER — SENNOSIDES-DOCUSATE SODIUM 8.6-50 MG PO TABS
1.0000 | ORAL_TABLET | Freq: Two times a day (BID) | ORAL | Status: AC
  Administered 2016-12-23 (×2): 1 via ORAL
  Filled 2016-12-23 (×2): qty 1

## 2016-12-23 MED ORDER — SENNOSIDES-DOCUSATE SODIUM 8.6-50 MG PO TABS
1.0000 | ORAL_TABLET | Freq: Two times a day (BID) | ORAL | Status: DC
  Administered 2016-12-23 – 2016-12-25 (×5): 1 via ORAL
  Filled 2016-12-23 (×4): qty 1

## 2016-12-23 MED ORDER — ACETAMINOPHEN 325 MG PO TABS
650.0000 mg | ORAL_TABLET | Freq: Four times a day (QID) | ORAL | Status: DC | PRN
  Administered 2016-12-23 – 2016-12-25 (×3): 650 mg via ORAL
  Filled 2016-12-23 (×3): qty 2

## 2016-12-23 MED ORDER — POLYETHYLENE GLYCOL 3350 17 G PO PACK
17.0000 g | PACK | Freq: Two times a day (BID) | ORAL | Status: DC
  Administered 2016-12-24 – 2016-12-25 (×3): 17 g via ORAL
  Filled 2016-12-23 (×4): qty 1

## 2016-12-23 MED ORDER — HYDROCODONE-ACETAMINOPHEN 5-325 MG PO TABS
1.0000 | ORAL_TABLET | Freq: Four times a day (QID) | ORAL | Status: AC | PRN
  Administered 2016-12-23: 1 via ORAL
  Filled 2016-12-23: qty 1

## 2016-12-23 MED ORDER — HYDROCODONE-ACETAMINOPHEN 5-325 MG PO TABS
1.0000 | ORAL_TABLET | Freq: Once | ORAL | Status: AC | PRN
  Administered 2016-12-23: 1 via ORAL
  Filled 2016-12-23: qty 1

## 2016-12-23 MED ORDER — HYDRALAZINE HCL 20 MG/ML IJ SOLN: 5.0000 mg | INTRAMUSCULAR | Status: DC | PRN

## 2016-12-23 MED ORDER — ACETAMINOPHEN 650 MG RE SUPP: 650.0000 mg | Freq: Four times a day (QID) | RECTAL | Status: DC | PRN

## 2016-12-23 MED ORDER — POTASSIUM CHLORIDE CRYS ER 20 MEQ PO TBCR
40.0000 meq | EXTENDED_RELEASE_TABLET | Freq: Once | ORAL | Status: AC
  Administered 2016-12-23: 40 meq via ORAL
  Filled 2016-12-23: qty 2

## 2016-12-23 MED ORDER — POLYETHYLENE GLYCOL 3350 17 G PO PACK
17.0000 g | PACK | Freq: Once | ORAL | Status: AC
  Administered 2016-12-23: 17 g via ORAL

## 2016-12-23 NOTE — Progress Notes (Signed)
[facility] Progress Note     Subjective: CC: pain slightly better  Patient awake and alert. Was able to eat yesterday. Denies nausea. Pain slightly better than yesterday.  Afebrile  Objective: Vital signs in last 24 hours: Temp:  [97 F (36.1 C)-99.7 F (37.6 C)] 97 F (36.1 C) (05/06 0603) Pulse Rate:  [75-92] 82 (05/06 0603) Resp:  [18-20] 20 (05/06 0603) BP: (114-141)/(60-77) 133/77 (05/06 0603) SpO2:  [96 %-98 %] 97 % (05/06 0603) Last BM Date: 12/20/16  Intake/Output from previous day: 05/05 0701 - 05/06 0700 In: 420 [IV Piggyback:410] Out: 2675 [Urine:2575; Drains:100] Intake/Output this shift: No intake/output data recorded.  PE: Gen:  Alert, NAD, pleasant. Evolving ecchymoses to face Card:  Sinus tachycardia, pedal pulses 2+ BL Pulm:  Normal effort, symmetrical air entry Abd: Soft, mildly tender around RUQ drain which has golden bilious output, previous surgical scar noted Skin: warm and dry, no rashes  Psych: A&Ox3  Neuro: following commands, no focal deficits   Lab Results:   Recent Labs  12/22/16 0358 12/23/16 0425  WBC 9.5 8.5  HGB 9.6* 11.3*  HCT 28.6* 32.7*  PLT 186 242   BMET  Recent Labs  12/22/16 0358 12/23/16 0425  NA 131* 130*  K 3.6 3.3*  CL 98* 96*  CO2 21* 24  GLUCOSE 88 116*  BUN 17 9  CREATININE 0.74 0.72  CALCIUM 8.0* 8.4*   PT/INR  Recent Labs  12/21/16 1052  LABPROT 15.3*  INR 1.20   CMP     Component Value Date/Time   NA 130 (L) 12/23/2016 0425   NA 121 (L) 12/14/2016 1050   K 3.3 (L) 12/23/2016 0425   CL 96 (L) 12/23/2016 0425   CO2 24 12/23/2016 0425   GLUCOSE 116 (H) 12/23/2016 0425   BUN 9 12/23/2016 0425   BUN 12 12/14/2016 1050   CREATININE 0.72 12/23/2016 0425   CALCIUM 8.4 (L) 12/23/2016 0425   PROT 5.8 (L) 12/21/2016 0443   ALBUMIN 2.0 (L) 12/21/2016 0443   AST 64 (H) 12/21/2016 0443   ALT 79 (H) 12/21/2016 0443   ALKPHOS 124 12/21/2016 0443   BILITOT 0.7 12/21/2016 0443   GFRNONAA >60 12/23/2016 0425   GFRAA >60 12/23/2016 0425   Lipase     Component Value Date/Time   LIPASE 19 01/01/2010 1018       Studies/Results: Dg Abd 1 View  Result Date: 12/22/2016 CLINICAL DATA:  Abdominal pain. The patient has a gallbladder catheter. EXAM: ABDOMEN - 1 VIEW COMPARISON:  None FINDINGS: The right upper quadrant catheter is again identified. Contrast in the ascending colon is from the recent CT scan. No bowel obstruction. Surgical hardware in the lumbar spine. No other acute abnormalities. IMPRESSION: No acute abnormalities.  Right upper quadrant drain. Electronically Signed   By: Dorise Bullion III M.D   On: 12/22/2016 14:28   Ir Perc Cholecystostomy  Result Date: 12/21/2016 CLINICAL DATA:  Acute cholecystitis and poor candidate currently for cholecystectomy. Request has been made to place a cholecystostomy tube. EXAM: PERCUTANEOUS CHOLECYSTOSTOMY COMPARISON:  Ultrasound and CT studies on 12/20/2016 ANESTHESIA/SEDATION: 1.0 mg IV Versed; 25 mcg IV Fentanyl. Total Moderate Sedation Time 16 minutes. The patient's level of consciousness and physiologic status were continuously monitored during the procedure by Radiology nursing. CONTRAST:  10 mL Isovue-300 MEDICATIONS: A scheduled dose of 3.375 g IV Zosyn was being administered during the procedure. FLUOROSCOPY TIME:  54 seconds.  6.8 mGy. PROCEDURE: The procedure, risks, benefits, and alternatives were  explained to the patient. Questions regarding the procedure were encouraged and answered. The patient understands and consents to the procedure. The right abdominal wall was prepped with chlorhexidine in a sterile fashion, and a sterile drape was applied covering the operative field. A sterile gown and sterile gloves were used for the procedure. Local anesthesia was provided with 1% Lidocaine. Ultrasound image documentation was performed. Fluoroscopy during the procedure and fluoro spot radiograph confirms appropriate catheter  position. Ultrasound was utilized to localize the gallbladder. Under direct ultrasound guidance, a 21 gauge needle was advanced via a transhepatic approach into the gallbladder lumen. Aspiration was performed and a bile sample sent for culture studies. A small amount of diluted contrast material was injected. A guide wire was then advanced into the gallbladder. A transitional dilator was placed. Percutaneous tract dilatation was then performed over a guide wire to 10-French. A 10-French pigtail drainage catheter was then advanced into the gallbladder lumen under fluoroscopy. Catheter was formed and injected with contrast material to confirm position. The catheter was flushed and connected to a gravity drainage bag. It was secured at the skin with a Prolene retention suture and Stat-Lock device. COMPLICATIONS: None FINDINGS: After needle puncture of the gallbladder there was return of grossly purulent fluid from the gallbladder lumen. A sample was sent for culture analysis. The cholecystostomy tube was advanced into the gallbladder lumen and formed. It is now draining well. This tube will be left to gravity drainage. IMPRESSION: Percutaneous cholecystostomy with placement of 10-French drainage catheter into the gallbladder lumen. This was left to gravity drainage. There was gross pus within the gallbladder lumen and a sample was sent for culture analysis. Electronically Signed   By: Aletta Edouard M.D.   On: 12/21/2016 15:35    Anti-infectives: Anti-infectives    Start     Dose/Rate Route Frequency Ordered Stop   12/20/16 1600  piperacillin-tazobactam (ZOSYN) IVPB 3.375 g     3.375 g 12.5 mL/hr over 240 Minutes Intravenous Every 8 hours 12/20/16 1454     12/16/16 1230  demeclocycline (DECLOMYCIN) tablet 150 mg  Status:  Discontinued     150 mg Oral Every 12 hours 12/16/16 1148 12/19/16 0944     Assessment/Plan Acalculous cholecystitis s/p PCT 5/4 - Afebrile - WBC normalized    TBI Fall Diabetes  mellitus HTN GERD Moderate protein malnutrition  FEN: carb counting diet, chronic hyponatremia ID: Zosyn 5/3>> VTE: SCD's   Mobilize, encourage PO as able. Will continue to follow.    LOS: 8 days    [facility] 12/23/2016, 8:13 AM

## 2016-12-23 NOTE — Progress Notes (Signed)
   Subjective:  Patient endorses frontal headache. He states he has just now remembered the events leading to his hospitalization and recounted the fall to [place].   Patient initially complained of abdominal pain and lack of appetite; however during exam he stated that his pain was gone. When we were leaving the room he asked to be handed his potato chips to eat.   He has not had BMs yet. No vomiting.   Objective:  Vital signs in last 24 hours: Vitals:   12/23/16 0139 12/23/16 0603 12/23/16 1051 12/23/16 1413  BP: (!) 141/74 133/77 125/65 121/70  Pulse: 84 82 67 79  Resp: 18 20 18 18   Temp: 99.7 F (37.6 C) 97 F (36.1 C) 98.7 F (37.1 C) 98.3 F (36.8 C)  TempSrc: Oral Oral Oral Oral  SpO2: 97% 97% 98% 99%  Weight:      Height:       Constitutional: chronically ill appearing gentleman CV: RRR, no murmurs, rubs or gallops appreciated, pulses intact, no LE edema, warm extremities Resp: CTAB Abd: active bowel sounds, no guarding, no tenderness, no rebound, soft abdomen.  Assessment/Plan:  Active Problems:   SDH (subdural hematoma) (HCC)   At risk for altered respiratory function  Acalculous cholecystitis:  s/p percutaneous drain; patient clinically much improved. No further fevers or leukocytosis; exam without tenderness, rigidity, rebound.  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 In outpatient setting, it did not respond to fluid restriction but is responding well inpatient - compliance was likely an issue.  AM Na 130. --fluid restriction --follow AM Bmet  HTN: --Maintain SBP <140 --hold  home lisinopril --Hydralazine 10mg  q4hr PRN for now; will restart oral meds when able and needed but BPs stable now  T2DM: Hgb A1c 6.1; home med is metformin. --hold metformin --CBG qac/wc  VTE ppx: SCDs Diet: CM Code: FULL  Dispo: Anticipated discharge in approximately 4-5 day(s).   Alphonzo Grieve, MD 12/23/2016, 3:42 PM Pager [phone]

## 2016-12-23 NOTE — Progress Notes (Signed)
  Date: 12/23/2016  Patient name: [name]  Medical record number: [id number]  Date of birth: June 01, 1946   I have seen and evaluated this patient and I have discussed the plan of care with the house staff. Please see their note for complete details. I concur with their findings with the following additions/corrections: The patient was seen on morning rounds. A telephone interpreter was used. The patient stated he felt horrible all over but as Dr. Jari Favre spoke to the he indicated he was feeling better. He did endorse a frontal headache over the eyes and abdominal pain. He asked for his potato chips to be added to him as we were leaving the room. CIR to evaluate the patient next week to see if he semicircular criteria.   Bartholomew Crews, MD 12/23/2016, 2:28 PM

## 2016-12-23 NOTE — Progress Notes (Signed)
Referring Physician(s): Dr. Verita Lamb  Supervising Physician: Aletta Edouard  Patient Status:  [facility] - In-pt  Chief Complaint: Acute cholecystitis s/p perc chole 5/4  Subjective: Pt s/p perc chole drain yesterday. Per report, grossly purulent output at the time of drain placement. Sore at drain site. No N/V, not much bowel activity  Allergies: Patient has no known allergies.  Medications:  Current Facility-Administered Medications:  .  hydrALAZINE (APRESOLINE) injection 10 mg, 10 mg, Intravenous, Q4H PRN, Alphonzo Grieve, MD, 10 mg at 12/19/16 0401 .  levETIRAcetam (KEPPRA) 500 mg in sodium chloride 0.9 % 100 mL IVPB, 500 mg, Intravenous, Q12H, Alphonzo Grieve, MD, Stopped at 12/22/16 0522 .  piperacillin-tazobactam (ZOSYN) IVPB 3.375 g, 3.375 g, Intravenous, Q8H, [name], MD, Stopped at 12/22/16 0930 .  polyethylene glycol (MIRALAX / GLYCOLAX) packet 17 g, 17 g, Oral, QHS, Svalina, Gorica, MD  (PHENERGAN) injection 12.5 mg, 12.5 mg, Intravenous, Q6H PRN, Collier Salina, MD, 12.5 mg at 12/18/16 1152 .  senna-docusate (Senokot-S) tablet 1 tablet, 1 tablet, Oral, BID, Svalina, Gorica, MD .  sodium chloride flush (NS) 0.9 % injection 3 mL, 3 mL, Intravenous, Q12H, Rice, Resa Miner, MD, 3 mL at 12/21/16 2103    Vital Signs: BP 125/65 (BP Location: Left Arm)   Pulse 67   Temp 98.7 F (37.1 C) (Oral)   Resp 18   Ht 5\' 5"  (1.651 m)   Wt 155 lb (70.3 kg)   SpO2 98%   BMI 25.79 kg/m   Physical Exam General: Resting in bed, nontoxic appearing. Lungs: CTA without w/r/r Heart: Regular Abdomen:soft, no pertioneal signs, mildly tender Drain: intact, site clean. Output now thin bilious   Imaging: Dg Abd 1 View  Result Date: 12/22/2016 CLINICAL DATA:  Abdominal pain. The patient has a gallbladder catheter. EXAM: ABDOMEN - 1 VIEW COMPARISON:  None FINDINGS: The right upper quadrant catheter is again identified. Contrast in the ascending colon is from  the recent CT scan. No bowel obstruction. Surgical hardware in the lumbar spine. No other acute abnormalities. IMPRESSION: No acute abnormalities.  Right upper quadrant drain. Electronically Signed   By: Dorise Bullion III M.D   On: 12/22/2016 14:28   Ct Abdomen Pelvis W Contrast  Result Date: 12/20/2016 CLINICAL DATA:  Abdominal pain.  Leukocytosis and peritonitis. EXAM: CT ABDOMEN AND PELVIS WITH CONTRAST TECHNIQUE: Multidetector CT imaging of the abdomen and pelvis was performed using the standard protocol following bolus administration of intravenous contrast. CONTRAST:  163mL ISOVUE-300 IOPAMIDOL (ISOVUE-300) INJECTION 61% COMPARISON:  October 03, 2010 FINDINGS: Lower chest: Mild atelectasis in the bases of the lungs. Lung bases are otherwise normal. Hepatobiliary: The gallbladder is distended with wall thickening, adjacent pericholecystic fluid, and adjacent fat stranding. No intra or extrahepatic biliary duct dilatation seen. No focal hepatic masses are seen. Portal vein is patent. Pancreas: Unremarkable. No pancreatic ductal dilatation or surrounding inflammatory changes. Spleen: Normal in size without focal abnormality. Adrenals/Urinary Tract: Adrenal glands are normal. A cyst is seen in the left kidney. Smaller cysts are seen in the right kidney. No hydronephrosis or perinephric stranding. No ureterectasis or ureteral stones. The bladder is decompressed with a Foley catheter. Stomach/Bowel: The patient is status post gastric surgery with a gastrojejunostomy identified. Remaining duodenum loop is anastomosed to the biliary tree. The fat stranding from the gallbladder also surrounds the proximal duodenum. Small bowel is otherwise normal. The colon is unremarkable. The appendix is not seen but there is no secondary evidence of appendicitis. Vascular/Lymphatic: Atherosclerotic  changes are seen in the non aneurysmal abdominal aorta and iliac vessels. No adenopathy. Reproductive: Prostate is unremarkable.  Other: No abdominal wall hernia or abnormality. No abdominopelvic ascites. Musculoskeletal: Postsurgical changes are seen in the lower lumbar spine. No acute bony abnormalities are identified. IMPRESSION: 1. The distended gallbladder with wall thickening, adjacent fat stranding, and adjacent fluid is consistent with acute cholecystitis, likely the cause of the patient's symptoms. 2. No other acute abnormalities.  Atherosclerosis. These results will be called to the ordering clinician or representative by the Radiologist Assistant, and communication documented in the PACS or zVision Dashboard. Electronically Signed   By: Dorise Bullion III M.D   On: 12/20/2016 14:16   Ir Perc Cholecystostomy  Result Date: 12/21/2016 CLINICAL DATA:  Acute cholecystitis and poor candidate currently for cholecystectomy. Request has been made to place a cholecystostomy tube. EXAM: PERCUTANEOUS CHOLECYSTOSTOMY COMPARISON:  Ultrasound and CT studies on 12/20/2016 ANESTHESIA/SEDATION: 1.0 mg IV Versed; 25 mcg IV Fentanyl. Total Moderate Sedation Time 16 minutes. The patient's level of consciousness and physiologic status were continuously monitored during the procedure by Radiology nursing. CONTRAST:  10 mL Isovue-300 MEDICATIONS: A scheduled dose of 3.375 g IV Zosyn was being administered during the procedure. FLUOROSCOPY TIME:  54 seconds.  6.8 mGy. PROCEDURE: The procedure, risks, benefits, and alternatives were explained to the patient. Questions regarding the procedure were encouraged and answered. The patient understands and consents to the procedure. The right abdominal wall was prepped with chlorhexidine in a sterile fashion, and a sterile drape was applied covering the operative field. A sterile gown and sterile gloves were used for the procedure. Local anesthesia was provided with 1% Lidocaine. Ultrasound image documentation was performed. Fluoroscopy during the procedure and fluoro spot radiograph confirms appropriate  catheter position. Ultrasound was utilized to localize the gallbladder. Under direct ultrasound guidance, a 21 gauge needle was advanced via a transhepatic approach into the gallbladder lumen. Aspiration was performed and a bile sample sent for culture studies. A small amount of diluted contrast material was injected. A guide wire was then advanced into the gallbladder. A transitional dilator was placed. Percutaneous tract dilatation was then performed over a guide wire to 10-French. A 10-French pigtail drainage catheter was then advanced into the gallbladder lumen under fluoroscopy. Catheter was formed and injected with contrast material to confirm position. The catheter was flushed and connected to a gravity drainage bag. It was secured at the skin with a Prolene retention suture and Stat-Lock device. COMPLICATIONS: None FINDINGS: After needle puncture of the gallbladder there was return of grossly purulent fluid from the gallbladder lumen. A sample was sent for culture analysis. The cholecystostomy tube was advanced into the gallbladder lumen and formed. It is now draining well. This tube will be left to gravity drainage. IMPRESSION: Percutaneous cholecystostomy with placement of 10-French drainage catheter into the gallbladder lumen. This was left to gravity drainage. There was gross pus within the gallbladder lumen and a sample was sent for culture analysis. Electronically Signed   By: Aletta Edouard M.D.   On: 12/21/2016 15:35   US Abdomen Limited Ruq  Result Date: 12/20/2016 CLINICAL DATA:  Follow-up from CT earlier today. EXAM: US ABDOMEN LIMITED - RIGHT UPPER QUADRANT COMPARISON:  CT 12/20/2016 FINDINGS: Gallbladder: Gallbladder is distended. Gallbladder wall is thickened, 5.3 mm. No sonographic Murphy's sign. There is pericholecystic fluid. Tumefactive sludge is present in the gallbladder neck. No stones are identified. Common bile duct: Diameter: 3.8 mm Liver: The liver is heterogeneous and echogenic  appearance.  No focal liver lesions are identified. IMPRESSION: 1. The findings are nonspecific but suspicious for acute cholecystitis. 2. Heterogeneous appearance of the liver. Electronically Signed   By: Nolon Nations M.D.   On: 12/20/2016 16:31    Labs:  CBC:  Recent Labs  12/20/16 0414 12/21/16 0443 12/22/16 0358 12/23/16 0425  WBC 18.3* 14.8* 9.5 8.5  HGB 11.1* 10.2* 9.6* 11.3*  HCT 32.7* 29.9* 28.6* 32.7*  PLT 204 182 186 242    COAGS:  Recent Labs  02/22/16 0953 12/15/16 0728 12/21/16 1052  INR 0.95 1.01 1.20  APTT 27  --   --     BMP:  Recent Labs  12/20/16 0414 12/21/16 0443 12/22/16 0358 12/23/16 0425  NA 126* 128* 131* 130*  K 3.9 3.6 3.6 3.3*  CL 93* 94* 98* 96*  CO2 24 24 21* 24  GLUCOSE 122* 109* 88 116*  BUN 13 12 17 9   CALCIUM 8.5* 8.1* 8.0* 8.4*  CREATININE 0.80 0.76 0.74 0.72  GFRNONAA >60 >60 >60 >60  GFRAA >60 >60 >60 >60    LIVER FUNCTION TESTS:  Recent Labs  12/16/16 0220 12/17/16 0236 12/20/16 0414 12/21/16 0443  BILITOT 1.3* 1.3* 1.0 0.7  AST 33 32 49* 64*  ALT 32 34 70* 79*  ALKPHOS 65 62 104 124  PROT 6.9 7.1 6.2* 5.8*  ALBUMIN 3.6 3.7 2.6* 2.0*    Assessment and Plan: Cholecystitis s/p per chole drain. Clinically improving Will cont to follow  Electronically Signed: Ascencion Dike 12/23/2016, 2:05 PM   I spent a total of 15 Minutes at the the patient's bedside AND on the patient's hospital floor or unit, greater than 50% of which was counseling/coordinating care for per chole drain

## 2016-12-24 DIAGNOSIS — Z978 Presence of other specified devices: Secondary | ICD-10-CM

## 2016-12-24 DIAGNOSIS — D62 Acute posthemorrhagic anemia: Secondary | ICD-10-CM

## 2016-12-24 DIAGNOSIS — Z87820 Personal history of traumatic brain injury: Secondary | ICD-10-CM

## 2016-12-24 DIAGNOSIS — K819 Cholecystitis, unspecified: Secondary | ICD-10-CM

## 2016-12-24 DIAGNOSIS — E871 Hypo-osmolality and hyponatremia: Secondary | ICD-10-CM

## 2016-12-24 DIAGNOSIS — E222 Syndrome of inappropriate secretion of antidiuretic hormone: Secondary | ICD-10-CM

## 2016-12-24 DIAGNOSIS — S065XAA Traumatic subdural hemorrhage with loss of consciousness status unknown, initial encounter: Secondary | ICD-10-CM

## 2016-12-24 DIAGNOSIS — S065X9A Traumatic subdural hemorrhage with loss of consciousness of unspecified duration, initial encounter: Secondary | ICD-10-CM

## 2016-12-24 DIAGNOSIS — I2510 Atherosclerotic heart disease of native coronary artery without angina pectoris: Secondary | ICD-10-CM

## 2016-12-24 LAB — CBC
HCT: 34.4 % — ABNORMAL LOW (ref 39.0–52.0)
HEMOGLOBIN: 11.5 g/dL — AB (ref 13.0–17.0)
MCH: 27.5 pg (ref 26.0–34.0)
MCHC: 33.4 g/dL (ref 30.0–36.0)
MCV: 82.3 fL (ref 78.0–100.0)
Platelets: 264 10*3/uL (ref 150–400)
RBC: 4.18 MIL/uL — ABNORMAL LOW (ref 4.22–5.81)
RDW: 19.2 % — ABNORMAL HIGH (ref 11.5–15.5)
WBC: 9.3 10*3/uL (ref 4.0–10.5)

## 2016-12-24 LAB — GLUCOSE, CAPILLARY
GLUCOSE-CAPILLARY: 142 mg/dL — AB (ref 65–99)
GLUCOSE-CAPILLARY: 112 mg/dL — AB (ref 65–99)
GLUCOSE-CAPILLARY: 97 mg/dL (ref 65–99)
Glucose-Capillary: 97 mg/dL (ref 65–99)

## 2016-12-24 LAB — BASIC METABOLIC PANEL
Anion gap: 8 (ref 5–15)
BUN: 11 mg/dL (ref 6–20)
CHLORIDE: 96 mmol/L — AB (ref 101–111)
CO2: 25 mmol/L (ref 22–32)
Calcium: 8.7 mg/dL — ABNORMAL LOW (ref 8.9–10.3)
Creatinine, Ser: 0.86 mg/dL (ref 0.61–1.24)
GFR calc Af Amer: >60 mL/min (ref >60)
GFR calc non Af Amer: >60 mL/min (ref >60)
Glucose, Bld: 110 mg/dL — ABNORMAL HIGH (ref 65–99)
POTASSIUM: 3.9 mmol/L (ref 3.5–5.1)
Sodium: 129 mmol/L — ABNORMAL LOW (ref 135–145)

## 2016-12-24 NOTE — Care Management Note (Signed)
Case Management Note  Patient Details  Name: [name] MRN: [id number] Date of Birth: [birth year]  Subjective/Objective:                    Action/Plan: PT now recommending [place] services at d/c. CM following for d/c needs, physician orders.  Expected Discharge Date:                  Expected Discharge Plan:  [place]  In-House Referral:     Discharge planning Services     Post Acute Care Choice:    Choice offered to:     DME Arranged:    DME Agency:     HH Arranged:    [place] Agency:     Status of Service:  In process, will continue to follow  If discussed at Long Length of Stay Meetings, dates discussed:    Additional Comments:  Pollie Friar, RN 12/24/2016, 4:28 PM
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
## 2016-12-24 NOTE — Progress Notes (Signed)
   Subjective:  Patient states he is not having abdominal or head pain. He has had bowel movements yesterday and today and felt well enough to walk around. He is eager to be more active.   Objective:  Vital signs in last 24 hours: Vitals:   12/23/16 1749 12/23/16 2125 12/24/16 0200 12/24/16 0519  BP: 127/68 110/60 124/74 128/76  Pulse: 85 77 87 94  Resp: 18 18 18 20   Temp: 98 F (36.7 C) 98.3 F (36.8 C) 97.9 F (36.6 C) 98.4 F (36.9 C)  TempSrc: Oral Oral Oral Oral  SpO2: 98% 99% 97% 100%  Weight:      Height:       Constitutional: chronically ill appearing gentleman, NAD CV: RRR, no murmurs, rubs or gallops appreciated, pulses intact, no LE edema, warm extremities Resp: CTAB Abd: active bowel sounds, no guarding, no tenderness, no rebound, soft abdomen.  Assessment/Plan:  Active Problems:   SDH (subdural hematoma) (H[facility])   At risk for altered respiratory function   Acalculous cholecystitis   Cholecystitis   Subdural hematoma ([facility])   Personal history of traumatic brain injury   Hyponatremia   Coronary artery disease involving native coronary artery of native heart without angina pectoris   Benign essential HTN   Acute blood loss anemia  Acalculous cholecystitis:  s/p percutaneous drain - source control with next day resolution of fever and leukocytosis. Patient has been on Zosyn for 4 full days and with adequate source control so further antibiotics are not necessarily indicated.  --surgery following - appreciate their assistance; pt to follow up in 4-6 weeks --IR placed percutaneous drain - appreciate their assistance; patient to follow up in 6 weeks  SDH: --stable; neurosurgery signed off last week --patient completed course of prophylactic Keppra --PT/OT recommend CIR - consult placed --acetaminophen PRN for headaches  SIADH: Patient educated about fluid restriction for treatment of his SIADH and prevention of further falls.  --fluid restriction --follow AM  Bmet  HTN: --Maintain SBP <140 --hold home lisinopril --BPs stable without intervention at this time; will hold off on restarting PO meds  T2DM: Hgb A1c 6.1; home med is metformin. --hold metformin --CBG qac/wc  VTE ppx: SCDs Diet: CM Code: FULL  Dispo: Patient is medically stable for discharge to CIR pending approval and bed - in process.   Alphonzo Grieve, MD 12/24/2016, 11:14 AM Pager [phone]

## 2016-12-24 NOTE — NC FL2 (Signed)
[place] MEDICAID FL2 LEVEL OF CARE SCREENING TOOL     IDENTIFICATION  Patient Name: [name] Birthdate: [birth year] Sex: male Admission Date (Current Location): 12/14/2016  [facility] and [place] Number:  Herbalist and Address:  [facility], [place] [address] [place], [place] [address]      Provider Number: [id number]  Attending Physician Name and Address:  [name], *  Relative Name and Phone Number:       Current Level of Care: Hospital Recommended Level of Care: [place] Prior Approval Number:    Date Approved/Denied:   PASRR Number: [id number]  Discharge Plan: SNF    Current Diagnoses: Patient Active Problem List   Diagnosis Date Noted  . Constipation 12/16/2016  . SDH (subdural hematoma) ([place]) 12/15/2016  . At risk for altered respiratory function   . Depression 11/14/2016  . GI bleed 11/14/2016  . SIADH (syndrome of inappropriate ADH production) ([place]) 10/19/2016  . Angina pectoris ([place]) 09/04/2016  . Benign prostatic hyperplasia without lower urinary tract symptoms 08/16/2016  . Type 2 diabetes mellitus without complication, without long-term current use of insulin ([place]) 08/16/2016  . Postoperative anemia due to acute blood loss 02/29/2016    Class: Acute  . 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 Degenerative disc disease, lumbar 02/28/2016    Class: Chronic  . Lumbar spondylosis with myelopathy 02/28/2016    Class: Chronic  . Lumbar spondylosis 02/28/2016  . Abdominal pain 06/08/2013  . Iron deficiency anemia 04/18/2010  . Essential hypertension 04/18/2010  . EMPHYSEMA 04/18/2010  . Gastroesophageal reflux disease without esophagitis 04/18/2010  . 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 ABDOMINAL PAIN OTHER SPECIFIED SITE 04/18/2010    Orientation RESPIRATION BLADDER Height & Weight     Self, Time, Situation, Place  Normal  Continent Weight: 155 lb (70.3 kg) Height:  5\' 5"  (165.1 cm)  BEHAVIORAL SYMPTOMS/MOOD NEUROLOGICAL BOWEL NUTRITION STATUS      Continent  (Please see d/c summary)  AMBULATORY STATUS COMMUNICATION OF NEEDS Skin   Limited Assist Verbally Skin abrasions (Laceration to left eyebrow, no dressing)                       Personal Care Assistance Level of Assistance  Bathing, Feeding, Dressing Bathing Assistance: Limited assistance Feeding assistance: Independent Dressing Assistance: Limited assistance     Functional Limitations Info  Sight, Hearing, Speech Sight Info: Adequate Hearing Info: Adequate Speech Info: Adequate    SPECIAL CARE FACTORS FREQUENCY  PT (By licensed PT), OT (By licensed OT)     PT Frequency: min 4x week OT Frequency: min 4x week            Contractures Contractures Info: Not present    Additional Factors Info  Code Status, Allergies Code Status Info: Full Code Allergies Info: No known allergies           Current Medications (12/24/2016):  This is the current hospital active medication list Current Facility-Administered Medications  Medication Dose Route Frequency Provider Last Rate Last Dose  . acetaminophen (TYLENOL) tablet 650 mg  650 mg Oral Q6H PRN Alphonzo Grieve, MD   650 mg at 12/23/16 1840   Or  . acetaminophen (TYLENOL) suppository 650 mg  650 mg Rectal Q6H PRN Alphonzo Grieve, MD      . hydrALAZINE (APRESOLINE) injection 5 mg  5 mg Intravenous Q4H PRN Alphonzo Grieve, MD      .  ondansetron (ZOFRAN) injection 4 mg  4 mg Intravenous Q6H PRN Collier Salina, MD   4 mg at 12/18/16 0920  . piperacillin-tazobactam (ZOSYN) IVPB 3.375 g  3.375 g Intravenous Q8H [name], MD 12.5 mL/hr at 12/24/16 0522 3.375 g at 12/24/16 0522  . polyethylene glycol (MIRALAX / GLYCOLAX) packet 17 g  17 g Oral BID Alphonzo Grieve, MD      . promethazine (PHENERGAN) injection 12.5 mg  12.5 mg Intravenous Q6H PRN Collier Salina, MD   12.5 mg  at 12/18/16 1152  . senna-docusate (Senokot-S) tablet 1 tablet  1 tablet Oral BID Alphonzo Grieve, MD   1 tablet at 12/23/16 2214  . sodium chloride flush (NS) 0.9 % injection 3 mL  3 mL Intravenous Q12H Rice, Resa Miner, MD   3 mL at 12/22/16 2200     Discharge Medications: Please see discharge summary for a list of discharge medications.  Relevant Imaging Results:  Relevant Lab Results:   Additional Information SSN: [id number]  Eileen Stanford, LCSW

## 2016-12-24 NOTE — Progress Notes (Signed)
Physical Therapy Treatment Patient Details Name: [name] MRN: [id number] DOB: 04-02-1946 Today's Date: 12/24/2016    History of Present Illness 71 y.o. male admitted for fall and brief LOC with history of SIADH,  chronic hyponatremia,DM2, HTN, diverticulosis, anemia and depression     PT Comments    Pt showing improvements in functional mobility this session.  He reported having no pain and tolerated ambulating in the hall requiring min guard assist with RW and completing various high level balance activities. Recommendations have been updated based on his improvements.  Pt expressed agreement for RW use at home in order to decrease fall risk.  He will benefit from HHPT in order to maximize return to PLOF through safety awareness education, DME instruction, and overall strengthening.  Will continue to follow acutely.  Follow Up Recommendations  Home health PT;Supervision/Assistance - 24 hour     Equipment Recommendations  Rolling walker with 5" wheels    Recommendations for Other Services       Precautions / Restrictions Precautions Precautions: Fall Restrictions Weight Bearing Restrictions: No Other Position/Activity Restrictions: Gallbladder drain present    Mobility  Bed Mobility Overal bed mobility: Needs Assistance Bed Mobility: Supine to Sit     Supine to sit: Supervision     General bed mobility comments: Supervision for safety.  VC's required to place both feet on floor and increased time required.  Transfers Overall transfer level: Needs assistance Equipment used: Rolling walker (2 wheeled) Transfers: Sit to/from Stand Sit to Stand: Min guard         General transfer comment: verbal Cues required to push up with hands from bed and to reach back for armrests with stand > sit.  Ambulation/Gait Ambulation/Gait assistance: Min guard Ambulation Distance (Feet): 350 Feet Assistive device: Rolling walker (2 wheeled) Gait Pattern/deviations:  Step-through pattern;Trunk flexed;Narrow base of support Gait velocity: normal Gait velocity interpretation: at or above normal speed for age/gender General Gait Details: High level balance activities performed (see balance section).Pt with overall steady gait and little reliance on RW for steadying.   Stairs            Wheelchair Mobility    Modified Rankin (Stroke Patients Only)       Balance Overall balance assessment: Needs assistance Sitting-balance support: Bilateral upper extremity supported;Feet supported Sitting balance-Leahy Scale: Fair     Standing balance support: No upper extremity supported Standing balance-Leahy Scale: Good Standing balance comment: Standing balance tested with multidirectional and varying force perturbations without UE support.  Pt able to maintain balance throughout.             High level balance activites: Backward walking;Turns;Head turns High Level Balance Comments: Pt cued to step over objects in hall and make horizonal then vertical head turns.  Pt decreased speed slightly with head turns.  Pt states his balance feels good with tasks.              Cognition Arousal/Alertness: Awake/alert Behavior During Therapy: WFL for tasks assessed/performed Overall Cognitive Status: Difficult to assess                                        Exercises      General Comments        Pertinent Vitals/Pain Pain Assessment: No/denies pain Faces Pain Scale: No hurt Pain Intervention(s): Monitored during session;Repositioned    Home Living  Prior Function            PT Goals (current goals can now be found in the care plan section) Acute Rehab PT Goals Time For Goal Achievement: 12/31/16 Potential to Achieve Goals: Good Progress towards PT goals: Progressing toward goals    Frequency    Min 4X/week      PT Plan Discharge plan needs to be updated    Co-evaluation               AM-PAC PT "6 Clicks" Daily Activity  Outcome Measure  Difficulty turning over in bed (including adjusting bedclothes, sheets and blankets)?: Total Difficulty moving from lying on back to sitting on the side of the bed? : Total Difficulty sitting down on and standing up from a chair with arms (e.g., wheelchair, bedside commode, etc,.)?: Total Help needed moving to and from a bed to chair (including a wheelchair)?: A Little Help needed walking in hospital room?: A Little Help needed climbing 3-5 steps with a railing? : A Little 6 Click Score: 12    End of Session Equipment Utilized During Treatment: Gait belt Activity Tolerance: Patient tolerated treatment well Patient left: in chair;with chair alarm set;with call bell/phone within reach Nurse Communication: Mobility status PT Visit Diagnosis: Unsteadiness on feet (R26.81);Repeated falls (R29.6);Pain (Simultaneous filing. User may not have seen previous data.)     Time: 1345-1417 PT Time Calculation (min) (ACUTE ONLY): 32 min  Charges:  $Gait Training: 8-22 mins $Therapeutic Activity: 8-22 mins                    G Codes:        Gaetano Net SPT   Gaetano Net 12/24/2016, 3:36 PM

## 2016-12-24 NOTE — Progress Notes (Signed)
Referring Physician(s): Dr Mendel Corning Dr Jonny Ruiz  Supervising Physician: Sandi Mariscal  Patient Status:  [facility] - In-pt  Chief Complaint:  Percutaneous cholecystostomy drain placed 5/4  Subjective:  Output is bile 155 cc yesterday 20 cc in bag Up in chair----feeling better  Allergies: Patient has no known allergies.  Medications: Prior to Admission medications   Medication Sig Start Date End Date Taking? Authorizing Provider  aspirin 81 MG chewable tablet Chew 81 mg by mouth daily. 08/29/16 08/29/17 Yes [provider]  atorvastatin (LIPITOR) 80 MG tablet Take 80 mg by mouth daily. 08/29/16 08/29/17 Yes [provider]  ferrous sulfate 325 (65 FE) MG EC tablet Take 1 tablet (325 mg total) by mouth 2 (two) times daily. 10/18/16 10/18/17 Yes Holley Raring, MD  lisinopril (PRINIVIL,ZESTRIL) 20 MG tablet Take 1 tablet (20 mg total) by mouth daily. 10/23/16 10/23/17 Yes Holley Raring, MD  metFORMIN (GLUCOPHAGE) 500 MG tablet Take 500 mg by mouth 2 (two) times daily with a meal.   Yes [provider]  metoprolol succinate (TOPROL-XL) 50 MG 24 hr tablet Take 50 mg by mouth daily. 09/11/16 09/11/17 Yes [provider]  nitroGLYCERIN (NITROSTAT) 0.4 MG SL tablet Place 1 tablet (0.4 mg total) under the tongue daily. Patient taking differently: Place 0.4 mg under the tongue every 5 (five) minutes as needed for chest pain.  10/18/16  Yes Holley Raring, MD  omeprazole (PRILOSEC) 20 MG capsule Take 20 mg by mouth daily.   Yes [provider]  pregabalin (LYRICA) 25 MG capsule Take 1 capsule (25 mg total) by mouth 3 (three) times daily. 10/18/16 10/18/17 Yes Holley Raring, MD  sennosides-docusate sodium (SENOKOT-S) 8.6-50 MG tablet Take 2 tablets by mouth daily. 11/26/16  Yes Rivet, Sindy Guadel[place], MD  tamsulosin (FLOMAX) 0.4 MG CAPS capsule Take 1 capsule (0.4 mg total) by mouth daily after breakfast. 03/01/16  Yes Jessy Oto, MD  venlafaxine XR (EFFEXOR-XR) 37.5 MG 24  hr capsule Take 37.5 mg by mouth daily with breakfast.   Yes [provider]     Vital Signs: BP 128/76 (BP Location: Left Arm)   Pulse 94   Temp 98.4 F (36.9 C) (Oral)   Resp 20   Ht 5\' 5"  (1.651 m)   Wt 155 lb (70.3 kg)   SpO2 100%   BMI 25.79 kg/m   Physical Exam  Abdominal: Soft.  Neurological: He is alert.  Skin: Skin is warm and dry.  Site of drain is clean and dry NT no bleeding Output is bile- 150 cc yesterday 20 cc in bag + Klebsiella  Afeb  Nursing note and vitals reviewed.   Imaging: Dg Abd 1 View  Result Date: 12/22/2016 CLINICAL DATA:  Abdominal pain. The patient has a gallbladder catheter. EXAM: ABDOMEN - 1 VIEW COMPARISON:  None FINDINGS: The right upper quadrant catheter is again identified. Contrast in the ascending colon is from the recent CT scan. No bowel obstruction. Surgical hardware in the lumbar spine. No other acute abnormalities. IMPRESSION: No acute abnormalities.  Right upper quadrant drain. Electronically Signed   By: Dorise Bullion III M.D   On: 12/22/2016 14:28   Ct Abdomen Pelvis W Contrast  Result Date: 12/20/2016 CLINICAL DATA:  Abdominal pain.  Leukocytosis and peritonitis. EXAM: CT ABDOMEN AND PELVIS WITH CONTRAST TECHNIQUE: Multidetector CT imaging of the abdomen and pelvis was performed using the standard protocol following bolus administration of intravenous contrast. CONTRAST:  113mL ISOVUE-300 IOPAMIDOL (ISOVUE-300) INJECTION 61% COMPARISON:  October 03, 2010 FINDINGS: Lower chest: Mild atelectasis in the bases of the lungs. Lung bases are otherwise normal. Hepatobiliary: The gallbladder is distended with wall thickening, adjacent pericholecystic fluid, and adjacent fat stranding. No intra or extrahepatic biliary duct dilatation seen. No focal hepatic masses are seen. Portal vein is patent. Pancreas: Unremarkable. No pancreatic ductal dilatation or surrounding inflammatory changes. Spleen: Normal in size without focal  abnormality. Adrenals/Urinary Tract: Adrenal glands are normal. A cyst is seen in the left kidney. Smaller cysts are seen in the right kidney. No hydronephrosis or perinephric stranding. No ureterectasis or ureteral stones. The bladder is decompressed with a Foley catheter. Stomach/Bowel: The patient is status post gastric surgery with a gastrojejunostomy identified. Remaining duodenum loop is anastomosed to the biliary tree. The fat stranding from the gallbladder also surrounds the proximal duodenum. Small bowel is otherwise normal. The colon is unremarkable. The appendix is not seen but there is no secondary evidence of appendicitis. Vascular/Lymphatic: Atherosclerotic changes are seen in the non aneurysmal abdominal aorta and iliac vessels. No adenopathy. Reproductive: Prostate is unremarkable. Other: No abdominal wall hernia or abnormality. No abdominopelvic ascites. Musculoskeletal: Postsurgical changes are seen in the lower lumbar spine. No acute bony abnormalities are identified. IMPRESSION: 1. The distended gallbladder with wall thickening, adjacent fat stranding, and adjacent fluid is consistent with acute cholecystitis, likely the cause of the patient's symptoms. 2. No other acute abnormalities.  Atherosclerosis. These results will be called to the ordering clinician or representative by the Radiologist Assistant, and communication documented in the PACS or zVision Dashboard. Electronically Signed   By: Dorise Bullion III M.D   On: 12/20/2016 14:16   Ir Perc Cholecystostomy  Result Date: 12/21/2016 CLINICAL DATA:  Acute cholecystitis and poor candidate currently for cholecystectomy. Request has been made to place a cholecystostomy tube. EXAM: PERCUTANEOUS CHOLECYSTOSTOMY COMPARISON:  Ultrasound and CT studies on 12/20/2016 ANESTHESIA/SEDATION: 1.0 mg IV Versed; 25 mcg IV Fentanyl. Total Moderate Sedation Time 16 minutes. The patient's level of consciousness and physiologic status were continuously  monitored during the procedure by Radiology nursing. CONTRAST:  10 mL Isovue-300 MEDICATIONS: A scheduled dose of 3.375 g IV Zosyn was being administered during the procedure. FLUOROSCOPY TIME:  54 seconds.  6.8 mGy. PROCEDURE: The procedure, risks, benefits, and alternatives were explained to the patient. Questions regarding the procedure were encouraged and answered. The patient understands and consents to the procedure. The right abdominal wall was prepped with chlorhexidine in a sterile fashion, and a sterile drape was applied covering the operative field. A sterile gown and sterile gloves were used for the procedure. Local anesthesia was provided with 1% Lidocaine. Ultrasound image documentation was performed. Fluoroscopy during the procedure and fluoro spot radiograph confirms appropriate catheter position. Ultrasound was utilized to localize the gallbladder. Under direct ultrasound guidance, a 21 gauge needle was advanced via a transhepatic approach into the gallbladder lumen. Aspiration was performed and a bile sample sent for culture studies. A small amount of diluted contrast material was injected. A guide wire was then advanced into the gallbladder. A transitional dilator was placed. Percutaneous tract dilatation was then performed over a guide wire to 10-French. A 10-French pigtail drainage catheter was then advanced into the gallbladder lumen under fluoroscopy. Catheter was formed and injected with contrast material to confirm position. The catheter was flushed and connected to a gravity drainage bag. It was secured at the skin with a Prolene retention suture and Stat-Lock device. COMPLICATIONS: None FINDINGS: After needle puncture of the gallbladder there was  return of grossly purulent fluid from the gallbladder lumen. A sample was sent for culture analysis. The cholecystostomy tube was advanced into the gallbladder lumen and formed. It is now draining well. This tube will be left to gravity drainage.  IMPRESSION: Percutaneous cholecystostomy with placement of 10-French drainage catheter into the gallbladder lumen. This was left to gravity drainage. There was gross pus within the gallbladder lumen and a sample was sent for culture analysis. Electronically Signed   By: Aletta Edouard M.D.   On: 12/21/2016 15:35   US Abdomen Limited Ruq  Result Date: 12/20/2016 CLINICAL DATA:  Follow-up from CT earlier today. EXAM: US ABDOMEN LIMITED - RIGHT UPPER QUADRANT COMPARISON:  CT 12/20/2016 FINDINGS: Gallbladder: Gallbladder is distended. Gallbladder wall is thickened, 5.3 mm. No sonographic Murphy's sign. There is pericholecystic fluid. Tumefactive sludge is present in the gallbladder neck. No stones are identified. Common bile duct: Diameter: 3.8 mm Liver: The liver is heterogeneous and echogenic appearance. No focal liver lesions are identified. IMPRESSION: 1. The findings are nonspecific but suspicious for acute cholecystitis. 2. Heterogeneous appearance of the liver. Electronically Signed   By: Nolon Nations M.D.   On: 12/20/2016 16:31    Labs:  CBC:  Recent Labs  12/21/16 0443 12/22/16 0358 12/23/16 0425 12/24/16 0404  WBC 14.8* 9.5 8.5 9.3  HGB 10.2* 9.6* 11.3* 11.5*  HCT 29.9* 28.6* 32.7* 34.4*  PLT 182 186 242 264    COAGS:  Recent Labs  02/22/16 0953 12/15/16 0728 12/21/16 1052  INR 0.95 1.01 1.20  APTT 27  --   --     BMP:  Recent Labs  12/21/16 0443 12/22/16 0358 12/23/16 0425 12/24/16 0404  NA 128* 131* 130* 129*  K 3.6 3.6 3.3* 3.9  CL 94* 98* 96* 96*  CO2 24 21* 24 25  GLUCOSE 109* 88 116* 110*  BUN 12 17 9 11   CALCIUM 8.1* 8.0* 8.4* 8.7*  CREATININE 0.76 0.74 0.72 0.86  GFRNONAA >60 >60 >60 >60  GFRAA >60 >60 >60 >60    LIVER FUNCTION TESTS:  Recent Labs  12/16/16 0220 12/17/16 0236 12/20/16 0414 12/21/16 0443  BILITOT 1.3* 1.3* 1.0 0.7  AST 33 32 49* 64*  ALT 32 34 70* 79*  ALKPHOS 65 62 104 124  PROT 6.9 7.1 6.2* 5.8*  ALBUMIN 3.6 3.7  2.6* 2.0*    Assessment and Plan:  Per chole drain placed 5/4 Will need in 6 weeks or more - unless to surgery We will follow pt in IR drain clinic Order in place - he will hear from scheduler for time and date   Electronically Signed: [place] A 12/24/2016, 8:25 AM   I spent a total of 15 Minutes at the the patient's bedside AND on the patient's hospital floor or unit, greater than 50% of which was counseling/coordinating care for perc chole drain

## 2016-12-24 NOTE — Progress Notes (Addendum)
Occupational Therapy Treatment Patient Details Name: Mykell Rawl MRN: [id number] DOB: Oct 22, 1945 Today's Date: 12/24/2016    History of present illness 71 y.o. male admitted for fall and brief LOC with history of SIADH,  chronic hyponatremia,DM2, HTN, diverticulosis, anemia and depression    OT comments  Pt progressing well toward OT goals. He was able to complete toilet transfers and tub/shower transfers with supervision with RW. Educated pt on use of shower seat and he verbalizes and demonstrates understanding. Pt reporting no pain this session and demonstrates greatly improved activity tolerance for ADL. Due to progress, feel pt safe to D/C home with 24 hour assistance and home health OT services for OT follow-up. Will continue to follow while admitted.    Follow Up Recommendations  Home health OT;Supervision/Assistance - 24 hour    Equipment Recommendations  3 in 1 bedside commode;Tub/shower seat    Recommendations for Other Services      Precautions / Restrictions Precautions Precautions: Fall Restrictions Weight Bearing Restrictions: No Other Position/Activity Restrictions: Gallbladder drain present       Mobility Bed Mobility Overal bed mobility: Needs Assistance Bed Mobility: Supine to Sit     Supine to sit: Supervision Sit to supine: Supervision   General bed mobility comments: Supervision for safety.  Transfers Overall transfer level: Needs assistance Equipment used: Rolling walker (2 wheeled) Transfers: Sit to/from Stand Sit to Stand: Supervision         General transfer comment: Supervision for safety.    Balance Overall balance assessment: Needs assistance Sitting-balance support: Feet supported;No upper extremity supported Sitting balance-Leahy Scale: Good     Standing balance support: No upper extremity supported Standing balance-Leahy Scale: Good Standing balance comment: Standing balance tested with multidirectional and varying force  perturbations without UE support.  Pt able to maintain balance throughout.                     ADL either performed or assessed with clinical judgement   ADL Overall ADL's : Needs assistance/impaired                                       General ADL Comments: Overall supervision necessary for ADL at this time. Educated pt on tub/shower transfer with shower seat and able to complete with supervision and RW.     Vision   Vision Assessment?: No apparent visual deficits Additional Comments: No visual deficits noted during session.    Perception     Praxis      Cognition Arousal/Alertness: Awake/alert Behavior During Therapy: WFL for tasks assessed/performed Overall Cognitive Status: Difficult to assess                                 General Comments: Pt following commands and problem solving appropriately.         Exercises     Shoulder Instructions       General Comments      Pertinent Vitals/ Pain       Pain Assessment: No/denies pain Faces Pain Scale: No hurt Pain Intervention(s): Monitored during session;Repositioned  Home Living                                          Prior Functioning/Environment  Frequency  Min 3X/week        Progress Toward Goals  OT Goals(current goals can now be found in the care plan section)  Progress towards OT goals: Progressing toward goals  Acute Rehab OT Goals Patient Stated Goal: to go home OT Goal Formulation: With patient Time For Goal Achievement: 01/01/17 Potential to Achieve Goals: Good ADL Goals Pt Will Perform Grooming: with modified independence;standing (including gathering items) Pt Will Perform Upper Body Dressing: with modified independence;sitting Pt Will Perform Lower Body Dressing: with modified independence;sit to/from stand Pt Will Transfer to Toilet: with modified independence;ambulating;bedside commode (BSC over toilet) Pt  Will Perform Toileting - Clothing Manipulation and hygiene: with modified independence;sit to/from stand Pt Will Perform Tub/Shower Transfer: with modified independence;rolling walker;Shower transfer  Plan Discharge plan needs to be updated;Frequency needs to be updated;Equipment recommendations need to be updated    Co-evaluation                 AM-PAC PT "6 Clicks" Daily Activity     Outcome Measure   Help from another person eating meals?: None Help from another person taking care of personal grooming?: A Little Help from another person toileting, which includes using toliet, bedpan, or urinal?: A Little Help from another person bathing (including washing, rinsing, drying)?: A Lot Help from another person to put on and taking off regular upper body clothing?: A Little Help from another person to put on and taking off regular lower body clothing?: A Lot 6 Click Score: 17    End of Session Equipment Utilized During Treatment: Gait belt;Rolling walker  OT Visit Diagnosis: Unsteadiness on feet (R26.81);Other symptoms and signs involving the nervous system (R29.898)   Activity Tolerance Patient limited by fatigue   Patient Left in bed;with call bell/phone within reach;with bed alarm set   Nurse Communication          Time: 2542-7062 OT Time Calculation (min): 18 min  Charges: OT General Charges $OT Visit: 1 Procedure OT Treatments $Self Care/Home Management : 8-22 mins  Norman Herrlich, MS OTR/L  Pager: [place] 12/24/2016, 4:56 PM

## 2016-12-24 NOTE — Progress Notes (Signed)
  Subjective: [name] appeared much more well today. He was sitting up in a chair and eating breakfast this morning. He also mentioned that he was able to have a bowel movement today and that he was able to walk around the halls with nurses yesterday.   Objective: Vital signs in last 24 hours: Vitals:   12/23/16 1749 12/23/16 2125 12/24/16 0200 12/24/16 0519  BP: 127/68 110/60 124/74 128/76  Pulse: 85 77 87 94  Resp: 18 18 18 20   Temp: 98 F (36.7 C) 98.3 F (36.8 C) 97.9 F (36.6 C) 98.4 F (36.9 C)  TempSrc: Oral Oral Oral Oral  SpO2: 98% 99% 97% 100%  Weight:      Height:        Physical Exam:  General: Well appearing, NAD  Pulm: No wheezes, rales or ronchi CV: RRR, nMRG Abd: NTND, Drain present in RUQ with small amount of bilious fluid   Assessment/Plan: Active Problems:   SDH (subdural hematoma) (HCC)   At risk for altered respiratory function   Acalculous cholecystitis   Cholecystitis   Subdural hematoma (HCC)   Personal history of traumatic brain injury   Hyponatremia   Coronary artery disease involving native coronary artery of native heart without angina pectoris   Benign essential HTN   Acute blood loss anemia  Acalculous Cholecystitis:  [name] denied abdominal pain this morning and did not have evident distention or pain on physical exam. He is on day 4 of Piperacillin-Tazobactam and has a percutaneous cholecystostomy drain placed. Cultures of his gallbladder fluid from the drain placement procedure grew Klebsiella Oxytoca and Clostridium perfringens. Based on his clinical appearance and the presence of source control, it would be reasonable to stop the course of antibiotics and leave the drain in place. The drain could be managed on an outpatient basis and removed after 2 weeks.  --d/c piperacillin-tazobactam  --schedule outpatient follow-up for drain management and removal --ensure surgical follow-up for eventual cholecystectomy   SIADH: During  his hospital course, [name]'s fluids have been naturally restricted and his Na levels have risen as a response. Now that [name] is tolerating oral intake, it is important to counsel him on the importance of fluid restriction as an outpatient to avoid dramatic hyponatremia that could have had a causal role in his initial fall.  --counseled to not exceed 2 bottles of water equivalent of liquid per-day  --prescribe salt tablet to take as outpatient  SDH: This appears to be a stable problem as mental status has not changed over the last week. He has completed his course of keppra.  HTN:  Recommended to maintain SBP <140 due to recent history of SDH.  --Maintain SBP <140 --Hydralazine 10mg  q4hr PRN   T2DM: Hgb A1c 6.1 home med is metformin --hold metformin, restart on discharge   VTE ppx: SCDs Diet: As tolerated  Code: FULL  Dispo: Needs CIR placement before home due to decrease in strength and stability  This is a Careers information officer Note.  The care of the patient was discussed with Dr.Svalina and the assessment and plan formulated with their assistance.  Please see their attached note for official documentation of the daily encounter.   LOS: 9 days   Cyndie Mull, Medical Student 12/24/2016, 11:42 AM

## 2016-12-24 NOTE — Progress Notes (Signed)
Patient has progressed well with PT and OT and no longer in need of an inpatient acute rehabilitation admission at this level. Home health is recommended and assistance of family. We will sign off at this time. [phone]

## 2016-12-24 NOTE — Progress Notes (Signed)
[facility] Progress Note     Subjective: CC: RUQ pain Pain improving. Sitting up in chair. Tolerating PO. Requesting to go for a walk.  Objective: Vital signs in last 24 hours: Temp:  [97.9 F (36.6 C)-98.7 F (37.1 C)] 98.4 F (36.9 C) (05/07 0519) Pulse Rate:  [67-94] 94 (05/07 0519) Resp:  [18-20] 20 (05/07 0519) BP: (110-128)/(60-76) 128/76 (05/07 0519) SpO2:  [97 %-100 %] 100 % (05/07 0519) Last BM Date: 12/23/16  Intake/Output from previous day: 05/06 0701 - 05/07 0700 In: 5  Out: 2365 [Urine:2210; Drains:155] Intake/Output this shift: No intake/output data recorded.  PE: Gen:  Alert, NAD, ecchymoses to face Card:  Sinus tachycardia, pedal pulses 2+ BL Pulm:  Normal effort, symmetrical air entry Abd: Soft, mildly tender around RUQ drain which has golden bilious output, previous surgical scar noted  Perc GB: 155 cc/24h, bilious Skin: warm and dry, no rashes  Psych: A&Ox3  Neuro: following commands, no focal deficits   Lab Results:   Recent Labs  12/23/16 0425 12/24/16 0404  WBC 8.5 9.3  HGB 11.3* 11.5*  HCT 32.7* 34.4*  PLT 242 264   BMET  Recent Labs  12/23/16 0425 12/24/16 0404  NA 130* 129*  K 3.3* 3.9  CL 96* 96*  CO2 24 25  GLUCOSE 116* 110*  BUN 9 11  CREATININE 0.72 0.86  CALCIUM 8.4* 8.7*   PT/INR  Recent Labs  12/21/16 1052  LABPROT 15.3*  INR 1.20   CMP     Component Value Date/Time   NA 129 (L) 12/24/2016 0404   NA 121 (L) 12/14/2016 1050   K 3.9 12/24/2016 0404   CL 96 (L) 12/24/2016 0404   CO2 25 12/24/2016 0404   GLUCOSE 110 (H) 12/24/2016 0404   BUN 11 12/24/2016 0404   BUN 12 12/14/2016 1050   CREATININE 0.86 12/24/2016 0404   CALCIUM 8.7 (L) 12/24/2016 0404   PROT 5.8 (L) 12/21/2016 0443   ALBUMIN 2.0 (L) 12/21/2016 0443   AST 64 (H) 12/21/2016 0443   ALT 79 (H) 12/21/2016 0443   ALKPHOS 124 12/21/2016 0443   BILITOT 0.7 12/21/2016 0443   GFRNONAA >60 12/24/2016 0404   GFRAA >60 12/24/2016  0404   Lipase     Component Value Date/Time   LIPASE 19 01/01/2010 1018       Studies/Results: Dg Abd 1 View  Result Date: 12/22/2016 CLINICAL DATA:  Abdominal pain. The patient has a gallbladder catheter. EXAM: ABDOMEN - 1 VIEW COMPARISON:  None FINDINGS: The right upper quadrant catheter is again identified. Contrast in the ascending colon is from the recent CT scan. No bowel obstruction. Surgical hardware in the lumbar spine. No other acute abnormalities. IMPRESSION: No acute abnormalities.  Right upper quadrant drain. Electronically Signed   By: Dorise Bullion III M.D   On: 12/22/2016 14:28    Anti-infectives: Anti-infectives    Start     Dose/Rate Route Frequency Ordered Stop   12/20/16 1600  piperacillin-tazobactam (ZOSYN) IVPB 3.375 g     3.375 g 12.5 mL/hr over 240 Minutes Intravenous Every 8 hours 12/20/16 1454     12/16/16 1230  demeclocycline (DECLOMYCIN) tablet 150 mg  Status:  Discontinued     150 mg Oral Every 12 hours 12/16/16 1148 12/19/16 0944       Assessment/Plan Acalculous cholecystitis s/p perc cholecystostomy tube 5/4 - Afebrile - WBC normalized - CX growin few klebsiella oxytoca, abundant clostridium perfringens>> re-incubated  - will require out patient follow  up in IR clinic and general surgery follow up in 4-6 weeks to discuss possible interval cholecystectomy.   - If discharged to CIR or otherwise, recommend continuing PO abx based on sensitivities through 12/26/16 to complete a one week course.   TBI Fall Diabetes mellitus HTN GERD Moderate protein malnutrition  FEN: carb counting diet, chronic hyponatremia ID: Zosyn 5/3>> (day #5) VTE: SCD's    Mobilize, encourage PO as able. Will continue to follow.    LOS: 9 days    [name] , [facility] 12/24/2016, 8:42 AM Pager: [phone] Consults: [phone] Mon-Fri 7:00 am-4:30 pm Sat-Sun 7:00 am-11:30 am

## 2016-12-24 NOTE — Progress Notes (Signed)
Internal Medicine Attending:   I saw and examined the patient. I reviewed the resident's note and I agree with the resident's findings and plan as documented in the resident's note.  Cholecystitis much improved now that we have source control and with antibiotics. We are working on disposition, likely to CIR or home with home health. Now that his fluid intake is increasing, hyponatremia is worsening, so we talked with him about imposing a more strict free water restriction.

## 2016-12-24 NOTE — Progress Notes (Signed)
I await updated therapy progress today to be able to assist in planning disposition. Last therapy was 12/20/2016. [phone]

## 2016-12-25 ENCOUNTER — Telehealth: Payer: Self-pay

## 2016-12-25 LAB — BASIC METABOLIC PANEL
Anion gap: 10 (ref 5–15)
BUN: 11 mg/dL (ref 6–20)
CALCIUM: 8.7 mg/dL — AB (ref 8.9–10.3)
CO2: 22 mmol/L (ref 22–32)
Chloride: 97 mmol/L — ABNORMAL LOW (ref 101–111)
Creatinine, Ser: 0.68 mg/dL (ref 0.61–1.24)
GFR calc Af Amer: >60 mL/min (ref >60)
GLUCOSE: 120 mg/dL — AB (ref 65–99)
Potassium: 4 mmol/L (ref 3.5–5.1)
Sodium: 129 mmol/L — ABNORMAL LOW (ref 135–145)

## 2016-12-25 LAB — GLUCOSE, CAPILLARY
Glucose-Capillary: 101 mg/dL — ABNORMAL HIGH (ref 65–99)
Glucose-Capillary: 146 mg/dL — ABNORMAL HIGH (ref 65–99)
Glucose-Capillary: 105 mg/dL — ABNORMAL HIGH (ref 65–99)

## 2016-12-25 MED ORDER — SODIUM CHLORIDE 1 G PO TABS: 2.0000 g | ORAL_TABLET | Freq: Two times a day (BID) | ORAL | 0 refills | Status: AC

## 2016-12-25 NOTE — Telephone Encounter (Signed)
Hospital TOC per Dr Benjamine Mola, appt 12/28/2016 discharge 12/25/2016.

## 2016-12-25 NOTE — Progress Notes (Signed)
Physical Therapy Treatment Patient Details Name: [name] MRN: [id number] DOB: 1945-09-04 Today's Date: 12/25/2016    History of Present Illness 71 y.o. male admitted for fall and brief LOC with history of SIADH,  chronic hyponatremia,DM2, HTN, diverticulosis, anemia and depression     PT Comments    Pt progressing toward PT goals, requiring supervision for safety and RW management this session.  He expresses agreement that the RW will be good for fall prevention at home.  He was educated on the benefit of HHPT to increase safety awareness with DME and to maintain functional mobility. Case manager present for part of session and states the plan is for D/C home today, but will keep on caseload and continue to follow until D/C.    Follow Up Recommendations  Home health PT;Supervision/Assistance - 24 hour     Equipment Recommendations  Rolling walker with 5" wheels (To be delivered today)    Recommendations for Other Services       Precautions / Restrictions Precautions Precautions: Fall Restrictions Weight Bearing Restrictions: No Other Position/Activity Restrictions: Gallbladder drain present    Mobility  Bed Mobility Overal bed mobility: Needs Assistance Bed Mobility: Supine to Sit     Supine to sit: Supervision Sit to supine: Modified independent (Device/Increase time);Supervision   General bed mobility comments: Supervision for safety and drain management. No cueing or assist required. Extra time required.  Transfers Overall transfer level: Needs assistance Equipment used: None;Rolling walker (2 wheeled) Transfers: Sit to/from Stand Sit to Stand: Supervision         General transfer comment: Pt initially stood without RW.  Pt instructed to bring walker with him to the bed for stand<>sit transfers  Ambulation/Gait Ambulation/Gait assistance: Supervision Ambulation Distance (Feet): 400 Feet Assistive device: Rolling walker (2 wheeled) Gait  Pattern/deviations: Step-through pattern;Trunk flexed;Narrow base of support Gait velocity: normal Gait velocity interpretation: at or above normal speed for age/gender General Gait Details: Supervision for safety. Cueing required for walker proximity.   Stairs            Wheelchair Mobility    Modified Rankin (Stroke Patients Only)       Balance Overall balance assessment: No apparent balance deficits (not formally assessed) Sitting-balance support: Feet supported;No upper extremity supported Sitting balance-Leahy Scale: Good     Standing balance support: No upper extremity supported Standing balance-Leahy Scale: Good                              Cognition Arousal/Alertness: Awake/alert Behavior During Therapy: WFL for tasks assessed/performed Overall Cognitive Status: Within Functional Limits for tasks assessed                                        Exercises      General Comments General comments (skin integrity, edema, etc.): Pt very pleasant this session but seemed to have confusion about receiving and paying for his equipment.  Case manager notified and states his equipment should arrive shortly after session and will be covered by insurance.  Pt requesting granddaughter's phone number and provided by case manager to call at end of session.      Pertinent Vitals/Pain Pain Assessment: No/denies pain Pain Intervention(s): Monitored during session    Home Living  Prior Function            PT Goals (current goals can now be found in the care plan section) Acute Rehab PT Goals Patient Stated Goal: to go home Time For Goal Achievement: 12/31/16 Potential to Achieve Goals: Good Progress towards PT goals: Progressing toward goals    Frequency    Min 4X/week      PT Plan Current plan remains appropriate    Co-evaluation              AM-PAC PT "6 Clicks" Daily Activity  Outcome  Measure  Difficulty turning over in bed (including adjusting bedclothes, sheets and blankets)?: A Little Difficulty moving from lying on back to sitting on the side of the bed? : A Lot Difficulty sitting down on and standing up from a chair with arms (e.g., wheelchair, bedside commode, etc,.)?: Total Help needed moving to and from a bed to chair (including a wheelchair)?: A Little Help needed walking in hospital room?: A Little Help needed climbing 3-5 steps with a railing? : A Little 6 Click Score: 15    End of Session Equipment Utilized During Treatment: Gait belt Activity Tolerance: Patient tolerated treatment well Patient left: in bed;with call bell/phone within reach Nurse Communication: Mobility status PT Visit Diagnosis: Unsteadiness on feet (R26.81);Repeated falls (R29.6);Pain     Time: 0623-7628 PT Time Calculation (min) (ACUTE ONLY): 18 min  Charges:                       G Codes:        Gaetano Net SPT   Gaetano Net 12/25/2016, 2:24 PM

## 2016-12-25 NOTE — Progress Notes (Signed)
Orders for RN with flushing of tube education entered. CM called patients son and informed him of d/c and that pt will need to be picked up from the hospital.  Pt refused 3 in 1 and tub bench. CM will return the DME to [facility] and have patients insurance credited.

## 2016-12-25 NOTE — Progress Notes (Signed)
  Subjective: [name] was well appearing this morning, sitting up in his chair in no acute distress. He denied pain in his abdomen or recent headaches and said he is ready to go home today.   Objective: Vital signs in last 24 hours: Vitals:   12/24/16 2043 12/25/16 0109 12/25/16 0529 12/25/16 0945  BP: (!) 150/79 (!) 155/85 (!) 145/65 (!) 141/82  Pulse: 85 93 82 96  Resp: 18 18 17 18   Temp: 98.3 F (36.8 C) 98.4 F (36.9 C) 98.8 F (37.1 C) 97.6 F (36.4 C)  TempSrc: Oral Oral Oral Oral  SpO2: 99% 98% 100% 99%  Weight:      Height:       Physical Exam:  General: NAD, Oriented to person place and time  CV: RRR, holosystolic murmer best appreciated at apex  Abd: Non-tender non-distended, drain in place in RUQ with no erythema or skin breakdown, bowel sounds normal    Assessment/Plan: Active Problems:   SDH (subdural hematoma) (HCC)   At risk for altered respiratory function   Acalculous cholecystitis   Cholecystitis   Subdural hematoma (HCC)   Personal history of traumatic brain injury   Hyponatremia   Coronary artery disease involving native coronary artery of native heart without angina pectoris   Benign essential HTN   Acute blood loss anemia  Acalculous Cholecystitis:  [name] denied abdominal pain this morning and did not have evident distention or pain on physical exam. His percutaneous cholecystostomy drain was in place and putting out bilious appearing fluid as expected. Antibiotics were discontinued yesterday and [name] has not demonstrated increased abdominal pain or fevers, indicating that source control should be sufficient until cholecystectomy is possible. Follow up for drain imaging and removal is scheduled with radiology for 01/29/17.  --ensure surgical follow-up for eventual cholecystectomy   SIADH: During his hospital course, [name]'s fluids have been naturally restricted and his Na levels have risen as a response. Now that [name] is tolerating oral intake,we have counseled him importance of fluid restriction as an outpatient to avoid dramatic hyponatremia that could have had a causal role in his initial fall. He understands instructions to not exceed more than the equivalent of 2 bottles of water a day in fluids.   --prescribe salt tablet to take as outpatient  SDH: This appears to be a stable problem as mental status has not changed over the last week. He has completed his course of keppra, has been off this medication for two days and has had no changes in neurological status or evidence or seizures.  HTN:  Recommended to maintain SBP <140 due to recent history of SDH.  --Restart home BP medications, metoprolol   T2DM: Hgb A1c 6.1 home med is metformin --hold metformin, restart on discharge   Diet: FULL  Code: FULL  Dispo: Plan is for home health on discharge. Son should be able to provide him the level of care and assistance that he needs. We will arrange for a home health nurse to check the gallbladder drain.   This is a Careers information officer Note.  The care of the patient was discussed with Dr. Benjamine Mola and the assessment and plan formulated with their assistance.  Please see their attached note for official documentation of the daily encounter.   LOS: 10 days   Cyndie Mull, Medical Student 12/25/2016, 10:11 AM

## 2016-12-25 NOTE — Progress Notes (Signed)
Pt being discharged from hospital per orders from MD. Pt and family educated on discharge instructions. Pt and family verbalized understanding of instructions. All questions and concerns were addressed. Pt's IV was removed prior to discharge. Pt discharged via ambulation.

## 2016-12-25 NOTE — Progress Notes (Signed)
Transitions of Care Pharmacy Note  Plan:  Educated on lisinopril and blood pressure --------------------------------------------- [name] is an 71 y.o. male who presents with a chief complaint of a fall. In anticipation of discharge, pharmacy has reviewed this patient's prior to admission medication history, as well as current inpatient medications listed per the [facility].  Current medication indications, dosing, frequency, and notable side effects reviewed with patient and caregiver. patient and caregiver verbalized understanding of current inpatient medication regimen and is aware that the After Visit Summary when presented, will represent the most accurate medication list at discharge.   [name] expressed no concerns. We discussed that the only major change to his medication was that he will stop the lisinopril at discharge until his clinic visit.  Assessment: Understanding of regimen: good Understanding of indications: good Potential of compliance: good Barriers to Obtaining Medications: No  Patient instructed to contact inpatient pharmacy team with further questions or concerns if needed.    Time spent preparing for discharge counseling: 10 Time spent counseling patient: 10   Thank you for allowing pharmacy to be a part of this patient's care.  Myer Peer Grayland Ormond), PharmD  PGY1 Pharmacy Resident Pager: [phone] 12/25/2016 4:18 PM

## 2016-12-25 NOTE — Care Management Note (Signed)
Case Management Note  Patient Details  Name: [name] MRN: [id number] Date of Birth: 1945-10-29  Subjective/Objective:                    Action/Plan: Pt discharging home with [facility] services. Patients friend Linus Orn called and was concerned about patient having assistance at home and inquired about his daughter coming from [place] through [employer]. CSW notified.  CM spoke to the patient via the interpretor service and he states that his 2 granddaughters will be available for 24/7 supervision at d/c. CM then provided him a list of [facility] agencies. He selected [facility]. Cory with [facility] notified and accepted the referral.  CM inquired (via interpretor) about the DME. Pt interested in the equipment. Santiago Glad with [facility] notified and will have the equipment delivered to his room.  CM (with patients consent) spoke to his Granddaughter Electronics engineer). She agrees that he will have 24/7 support once home. She asked that she or her father be notified for transportation home.   Expected Discharge Date:                  Expected Discharge Plan:  [place]  In-House Referral:     Discharge planning Services  CM Consult  Post Acute Care Choice:  Durable Medical Equipment, Home Health Choice offered to:  Patient  DME Arranged:  3-N-1, Tub bench, Walker rolling DME Agency:  [place]:  RN, PT, OT [facility] Agency:  [place]  Status of Service:  Completed, signed off  If discussed at [place] of Stay Meetings, dates discussed:    Additional Comments:  Pollie Friar, RN 12/25/2016, 1:54 PM

## 2016-12-25 NOTE — Discharge Summary (Signed)
Name: [name] MRN: [id number] DOB: Mar 28, 1946 71 y.o. PCP: Jason Raring, MD  Date of Admission: 12/14/2016  9:01 PM Date of Discharge: 12/25/2016 Attending Physician: Jason Filler, MD  Discharge Diagnosis: 1. Traumatic subdural hematoma 2. Peritonitis 3. Acalculous cholecystitis Active Problems:   SDH (subdural hematoma) (HCC)   At risk for altered respiratory function   Acalculous cholecystitis   Cholecystitis   Subdural hematoma (HCC)   Personal history of traumatic brain injury   Hyponatremia   Coronary artery disease involving native coronary artery of native heart without angina pectoris   Benign essential HTN   Acute blood loss anemia   Discharge Medications: Allergies as of 12/25/2016   No Known Allergies     Medication List    STOP taking these medications   lisinopril 20 MG tablet Commonly known as:  PRINIVIL,ZESTRIL     TAKE these medications   aspirin 81 MG chewable tablet Chew 81 mg by mouth daily.   atorvastatin 80 MG tablet Commonly known as:  LIPITOR Take 80 mg by mouth daily.   ferrous sulfate 325 (65 FE) MG EC tablet Take 1 tablet (325 mg total) by mouth 2 (two) times daily.   metFORMIN 500 MG tablet Commonly known as:  GLUCOPHAGE Take 500 mg by mouth 2 (two) times daily with a meal.   metoprolol succinate 50 MG 24 hr tablet Commonly known as:  TOPROL-XL Take 50 mg by mouth daily.   nitroGLYCERIN 0.4 MG SL tablet Commonly known as:  NITROSTAT Place 1 tablet (0.4 mg total) under the tongue daily. What changed:  when to take this  reasons to take this   omeprazole 20 MG capsule Commonly known as:  PRILOSEC Take 20 mg by mouth daily.   pregabalin 25 MG capsule Commonly known as:  LYRICA Take 1 capsule (25 mg total) by mouth 3 (three) times daily.   sennosides-docusate sodium 8.6-50 MG tablet Commonly known as:  SENOKOT-S Take 2 tablets by mouth daily.   sodium chloride 1 g tablet Take 2 tablets (2 g  total) by mouth 2 (two) times daily with a meal.   tamsulosin 0.4 MG Caps capsule Commonly known as:  FLOMAX Take 1 capsule (0.4 mg total) by mouth daily after breakfast.   venlafaxine XR 37.5 MG 24 hr capsule Commonly known as:  EFFEXOR-XR Take 37.5 mg by mouth daily with breakfast.       Disposition and follow-up:   [name] was discharged from [facility] in Stable condition.  At the hospital follow up visit please address:  1.   SDH: --is patient having headaches? Confusion? Focal neural deficits?  SIADH: --d/c sodium was 129; please repeat Bmet --patient was instructed on sodium restriction and adding salt tablets; is he doing this? --any more unsteadiness on his feet? Is he working with PT?  Acalculous cholycystitis: --abdominal pain, nausea, vomiting? --please remind him of surgery and IR appointments in May --is he having regular bowel movements?  HTN: Home lisinopril was held at discharge, but metoprolol was continued. --please evaluate need for changing antihypertensive regimen  Hemetemesis? Patient reported to have hemetemesis in ED on initial presentation; none afterwards. He was referred to GI in March but doesn't appear this was completed. Would recommend following up and referral back to GI.  2.  Labs / imaging needed at time of follow-up: Bmet  3.  Pending labs/ test needing follow-up: none  Follow-up Appointments: Follow-up Information    Jason Edouard, MD Follow up in  6 week(s).   Specialty:  Interventional Radiology Why:  pt will hear from IR OP scheduler for follow up date and time; call [phone] if questions/concerns Contact information: [address] [place] [address] [phone]        Jason Pickerel, MD. Go on 01/22/2017.   Specialty:  General Surgery Why:  at 11:15 in the morning Contact information: [address] [place] [address] [phone]        [facility]. Go on 12/28/2016.   Why:  @1045  AM for hospital follow up and blood test Contact information: [address] [place] [facility] Course by problem list: Active Problems:   SDH (subdural hematoma) (HCC)   At risk for altered respiratory function   Acalculous cholecystitis   Cholecystitis   Subdural hematoma (HCC)   Personal history of traumatic brain injury   Hyponatremia   Coronary artery disease involving native coronary artery of native heart without angina pectoris   Benign essential HTN   Acute blood loss anemia   SDH: Patient presented to [facility] following a fall. He was found to have a small subdural hematoma in addition to an inferior orbital fracture. On f/u CT, it was found that he developed a 4.3x2.3cm intraparenchymal hematoma with surrounding vasogenic edema with mild mass effect that remained stable; he was transferred to the ICU for closer monitoring. He was given steroids and seizure prophylaxis with Keppra x7 days; due to his stability, he was transferred back to floor. No further complications were encountered. It is thought that his fall was precipitated by his SIADH which will be discussed below.  Acalculous cholecystitis: Upon transfer to the floor, patient had developed increasing abdominal pain, nausea, vomiting (which subsided after one day) with peritoneal signs correlated with a new leukocytosis and fever. CT imaging, followed by RUQ ultrasound was consistent with acalculous cholecystitis - there was no evidence of perforation, abscess or other intra-abdominal process. IR and surgery were consulted; a percutaneous drain was placed and patient started on broad spectrum antibiotics. He had quick improvement in symptoms and exam. Culture of fluid grew K oxytoca and C perfringens - both sensitive to Zosyn. Patient was maintained on Zosyn for 4 days; due to his quick resolution of signs, symptoms and now  normal bile outflow per drain, further antibiotic regimen was not continued. Patient is to have drain in place for 6 weeks if cholecystectomy is not performed prior to this timeline. He has surgery follow up at 4 weeks to discuss surgical options.   Ileus: Patient with ileus in setting of severe illness, prolonged hospitalization and opioid therapy which was complicated by peritonitis 2/2 acalculous cholecystitis. At discharge, patient had resumed having bowel movements and was tolerating diet.   SIADH: Patient with history of SIADH which likely lead to the fall prompting his hospitalization. Outpatient workup had been negative for other causes of his hyponatremia. During hospitalization, we were able to adequately control his fluid intake and his sodium was correcting appropriately, leading to conclusion that outpatient treatment was hindered by poor compliance. At discharge, patient and family were educated on fluid restriction and he was prescribed salt tabs. Please repeat his Bmet at follow up appointment and encourage staying with fluid restricted diet to further prevent complications and falls 2/2 SIADH.   Hypertension: Patient with history of hypertension; on admission for his SDH, home meds of metoprolol and  lisinopril were held, and BP control (SBP <140) was maintained using PRN IV meds. After resolution of peritonitis, PRN meds were not necessary as his BP was well controlled. At discharge, metoprolol was continued and lisinopril discontinued. Please assess need for further antihypertensive management.   Discharge Vitals:   BP 125/79 (BP Location: Left Arm)   Pulse (!) 102   Temp 98.2 F (36.8 C) (Oral)   Resp 18   Ht 5\' 5"  (1.651 m)   Wt 70.3 kg (155 lb)   SpO2 100%   BMI 25.79 kg/m   Pertinent Labs, Studies, and Procedures:  CBC Latest Ref Rng & Units 12/24/2016 12/23/2016 12/22/2016  WBC 4.0 - 10.5 K/uL 9.3 8.5 9.5  Hemoglobin 13.0 - 17.0 g/dL 11.5(L) 11.3(L) 9.6(L)  Hematocrit 39.0  - 52.0 % 34.4(L) 32.7(L) 28.6(L)  Platelets 150 - 400 K/uL 264 242 186   CMP Latest Ref Rng & Units 12/25/2016 12/24/2016 12/23/2016  Glucose 65 - 99 mg/dL 120(H) 110(H) 116(H)  BUN 6 - 20 mg/dL 11 11 9   Creatinine 0.61 - 1.24 mg/dL 0.68 0.86 0.72  Sodium 135 - 145 mmol/L 129(L) 129(L) 130(L)  Potassium 3.5 - 5.1 mmol/L 4.0 3.9 3.3(L)  Chloride 101 - 111 mmol/L 97(L) 96(L) 96(L)  CO2 22 - 32 mmol/L 22 25 24   Calcium 8.9 - 10.3 mg/dL 8.7(L) 8.7(L) 8.4(L)  Total Protein 6.5 - 8.1 g/dL - - -  Total Bilirubin 0.3 - 1.2 mg/dL - - -  Alkaline Phos 38 - 126 U/L - - -  AST 15 - 41 U/L - - -  ALT 17 - 63 U/L - - -    IMAGING:  CT head 4/27: Small left hemispheric subdural hemorrhage - 33mm Moderate chronic microvascular ischemic changes  CT cervical spine and maxillofacial wo contrast 4/27: No traumatic/acute cervical spine pathology Soft tissue hematoma over forehead and facial contusions  CT head 4/28: Left temporal intraparenchymal hemorrhage 4.3x2cm with surrounding vasogenic edema Left subdural hematoma 54mm Improving left-right midline shift  CT abd/pel w contrast 5/3: Distended gallbladder with wall thickening, adjacent stranding, and adjacent fluid consistent with acute cholecystitis  RUQ U/S 5/3: Distended gallbladder with wall thickening; no stones identified  Procedures: IR percutaneous cholecystostomy 12/22/16  Discharge Instructions: Discharge Instructions    Call MD for:  persistant nausea and vomiting    Complete by:  As directed    Call MD for:  temperature >100.4    Complete by:  As directed    Diet - low sodium heart healthy    Complete by:  As directed    Discharge instructions    Complete by:  As directed    You will need to continue a fluid restricted diet no more than 2 bottles of liquid combined from all sources per day.  We will see you in clinic on Friday to follow up how this is going. We may also restart your other blood pressure medicine at that time.     Face-to-face encounter (required for Medicare/Medicaid patients)    Complete by:  As directed    I Hinton Lovely certify that this patient is under my care and that I, or a nurse practitioner or physician's assistant working with me, had a face-to-face encounter that meets the physician face-to-face encounter requirements with this patient on 12/25/2016. The encounter with the patient was in whole, or in part for the following medical condition(s) which is the primary reason for home health care (List medical condition): Acute acalculous cholescystitis s/p  percutaneous cholecystostomy drain placement  He will likely need ongoing education and help to monitor his drain that will be in place until clinic F/U in June.   The encounter with the patient was in whole, or in part, for the following medical condition, which is the primary reason for home health care:  Acute acalculous cholescystitis   I certify that, based on my findings, the following services are medically necessary home health services:  Nursing   Reason for Medically [place]:  Other See Comments   My clinical findings support the need for the above services:  Unable to leave home safely without assistance and/or assistive device   Further, I certify that my clinical findings support that this patient is homebound due to:  Unable to leave home safely without assistance   Home Health    Complete by:  As directed    To provide the following care/treatments:  RN   Increase activity slowly    Complete by:  As directed       Signed: Alphonzo Grieve, MD 12/26/2016, 7:32 AM   Pager: [phone]

## 2016-12-25 NOTE — Progress Notes (Signed)
   Subjective/Chief Complaint: No complaints. No abdominal pain   Objective: Vital signs in last 24 hours: Temp:  [98.2 F (36.8 C)-98.8 F (37.1 C)] 98.8 F (37.1 C) (05/08 0529) Pulse Rate:  [82-93] 82 (05/08 0529) Resp:  [17-20] 17 (05/08 0529) BP: (127-155)/(65-85) 145/65 (05/08 0529) SpO2:  [97 %-100 %] 100 % (05/08 0529) Last BM Date: 12/23/16  Intake/Output from previous day: 05/07 0701 - 05/08 0700 In: 120 [P.O.:120] Out: 1535 [Urine:1400; Drains:135] Intake/Output this shift: No intake/output data recorded.  General appearance: alert and cooperative Resp: clear to auscultation bilaterally Cardio: regular rate and rhythm GI: soft, nontender. drain output bilious  Lab Results:   Recent Labs  12/23/16 0425 12/24/16 0404  WBC 8.5 9.3  HGB 11.3* 11.5*  HCT 32.7* 34.4*  PLT 242 264   BMET  Recent Labs  12/24/16 0404 12/25/16 0351  NA 129* 129*  K 3.9 4.0  CL 96* 97*  CO2 25 22  GLUCOSE 110* 120*  BUN 11 11  CREATININE 0.86 0.68  CALCIUM 8.7* 8.7*   PT/INR No results for input([name]): LABPROT, INR in the last 72 hours. ABG No results for input([name]): PHART, HCO3 in the last 72 hours.  Invalid input([name]): PCO2, PO2  Studies/Results: No results found.  Anti-infectives: Anti-infectives    Start     Dose/Rate Route Frequency Ordered Stop   12/20/16 1600  piperacillin-tazobactam (ZOSYN) IVPB 3.375 g  Status:  Discontinued     3.375 g 12.5 mL/hr over 240 Minutes Intravenous Every 8 hours 12/20/16 1454 12/24/16 1059   12/16/16 1230  demeclocycline (DECLOMYCIN) tablet 150 mg  Status:  Discontinued     150 mg Oral Every 12 hours 12/16/16 1148 12/19/16 0944      Assessment/Plan: [name]/p * No surgery found * Advance diet  Continue PT Continue perc gallbladder drain. Would study in a couple weeks to see if it can be capped. [place] for discharge when medically stable  LOS: 10 days    TOTH III,[name] 12/25/2016

## 2016-12-25 NOTE — Progress Notes (Signed)
   Subjective: [name] is resting comfortably in the bedside chair today without complaints. He is interested in returning home today as he feels back to usual. He is eating without difficulty or any abdominal pain.  Objective:  Vital signs in last 24 hours: Vitals:   12/24/16 2043 12/25/16 0109 12/25/16 0529 12/25/16 0945  BP: (!) 150/79 (!) 155/85 (!) 145/65 (!) 141/82  Pulse: 85 93 82 96  Resp: 18 18 17 18   Temp: 98.3 F (36.8 C) 98.4 F (36.9 C) 98.8 F (37.1 C) 97.6 F (36.4 C)  TempSrc: Oral Oral Oral Oral  SpO2: 99% 98% 100% 99%  Weight:      Height:       GENERAL- alert, co-operative, NAD HEENT- resolving facial and neck ecchymoses present, minimal remaining subconjunctival hemorrhage on the right eye CARDIAC- RRR, no murmurs, rubs or gallops ABDOMEN- Soft, nontender, no guarding or rebound, right upper quadrant percutaneous drain in place with biliary drainage EXTREMITIES- symmetric, no pedal edema   Assessment/Plan: #Acalculous cholecystitis:  He is now asymptomatic with no additional fever or leukocytosis. Antibiotics were discontinued yesterday after finishing a 4 day course due to good source control. He has scheduled follow-up in June for IR clinic to evaluate his drain.  #Acute urinary retention Urinary retention of 800 mL's earlier on this admission is intermediate risk for bladder stretch injury. We will remove his Foley catheter this morning and attempt a voiding trial this afternoon--if he achieves a postvoid residual less than 300 mL plan to discharge without indwelling Foley catheter otherwise he will need catheter with several weeks until urology follow-up.  #SDH Stable subdural hematoma that showed interval decrease on repeat head CT last week. His symptoms are stable it's unclear if this contributes to gait instability vs his history of chronic hyponatremia and deconditioning.  #SIADH Patient educated about fluid restriction for treatment of his  SIADH and prevention of further falls. We will arrange for close follow-up at the internal medicine clinic on 5/11 to reassess his education for fluid restriction and repeat labs.  #HTN He had appeared moderately elevated hypertension due to withholding by mouth medications. We'll recommend recheck blood pressure at his close clinic follow-up in resuming meds at that time as appropriate.   Dispo: Anticipated discharge to home with [facility] therapy and family for supervision this afternoon  Jason Salina, MD PGY-II Internal Medicine Resident Pager# [phone] 12/25/2016, 11:59 AM

## 2016-12-26 ENCOUNTER — Other Ambulatory Visit: Payer: Self-pay | Admitting: General Surgery

## 2016-12-26 ENCOUNTER — Other Ambulatory Visit: Payer: Self-pay | Admitting: Internal Medicine

## 2016-12-26 ENCOUNTER — Telehealth: Payer: Self-pay

## 2016-12-26 DIAGNOSIS — F322 Major depressive disorder, single episode, severe without psychotic features: Secondary | ICD-10-CM

## 2016-12-26 DIAGNOSIS — K819 Cholecystitis, unspecified: Secondary | ICD-10-CM

## 2016-12-26 NOTE — Telephone Encounter (Signed)
Shraddha from [facility] requesting VO. Please call back.

## 2016-12-27 LAB — AEROBIC/ANAEROBIC CULTURE (SURGICAL/DEEP WOUND): SPECIAL REQUESTS: NORMAL

## 2016-12-27 LAB — AEROBIC/ANAEROBIC CULTURE W GRAM STAIN (SURGICAL/DEEP WOUND)

## 2016-12-28 ENCOUNTER — Ambulatory Visit (INDEPENDENT_AMBULATORY_CARE_PROVIDER_SITE_OTHER): Payer: Medicare Other | Admitting: Internal Medicine

## 2016-12-28 ENCOUNTER — Ambulatory Visit (HOSPITAL_COMMUNITY)
Admission: RE | Admit: 2016-12-28 | Discharge: 2016-12-28 | Disposition: A | Discharge status: Home / Self Care | Payer: Medicare Other | Source: Ambulatory Visit | Attending: Family Medicine | Admitting: Family Medicine

## 2016-12-28 ENCOUNTER — Encounter: Payer: Self-pay | Admitting: Internal Medicine

## 2016-12-28 VITALS — BP 134/78 | HR 120 | Temp 97.7°F | Ht 65.0 in | Wt 145.5 lb

## 2016-12-28 DIAGNOSIS — R000 Tachycardia, unspecified: Secondary | ICD-10-CM | POA: Insufficient documentation

## 2016-12-28 DIAGNOSIS — Z9689 Presence of other specified functional implants: Secondary | ICD-10-CM | POA: Diagnosis not present

## 2016-12-28 DIAGNOSIS — E222 Syndrome of inappropriate secretion of antidiuretic hormone: Secondary | ICD-10-CM

## 2016-12-28 DIAGNOSIS — Z5189 Encounter for other specified aftercare: Secondary | ICD-10-CM | POA: Diagnosis not present

## 2016-12-28 DIAGNOSIS — K819 Cholecystitis, unspecified: Secondary | ICD-10-CM

## 2016-12-28 DIAGNOSIS — M48062 Spinal stenosis, lumbar region with neurogenic claudication: Secondary | ICD-10-CM | POA: Diagnosis not present

## 2016-12-28 LAB — BASIC METABOLIC PANEL
ANION GAP: 10 (ref 5–15)
BUN: 22 mg/dL — ABNORMAL HIGH (ref 6–20)
CALCIUM: 9.1 mg/dL (ref 8.9–10.3)
CHLORIDE: 98 mmol/L — AB (ref 101–111)
CO2: 24 mmol/L (ref 22–32)
CREATININE: 0.89 mg/dL (ref 0.61–1.24)
GFR calc Af Amer: >60 mL/min (ref >60)
GFR calc non Af Amer: >60 mL/min (ref >60)
GLUCOSE: 110 mg/dL — AB (ref 65–99)
Potassium: 4.5 mmol/L (ref 3.5–5.1)
Sodium: 132 mmol/L — ABNORMAL LOW (ref 135–145)

## 2016-12-28 MED ORDER — PREGABALIN 25 MG PO CAPS: 25.0000 mg | ORAL_CAPSULE | Freq: Three times a day (TID) | ORAL | 2 refills | Status: DC

## 2016-12-28 NOTE — Assessment & Plan Note (Signed)
Patient is here today for hospital follow-up. He was discharged from [facility] on 12/25/2016. He was admitted for a fall with subdural hematoma secondary to his SIADH. His hospital course was compensated by acalculus cholecystitis status post percutaneous drain placement by IR on 12/21/2016. Sodium on discharge was 129. He was discharged on a fluid restriction (2 bottles of water a day) and BID salt tablets. Since discharge, he reports compliance with his fluid restriction but has not yet picked up his prescription for the salt tablets. He reports falling twice since discharge. The first time he was leaning over to pick up a water bottle when he blacked out and fell. The second time (this morning), he was leaning over in the shower when he fell. He denies loss of consciousness the second time. He denies any head injury or trauma. On exam, his face, neck, and arms are covered with diffuse traumatic ecchymosis, however patient reports these bruises are from his fall prior to admission and are not new since discharge (see pictures in progress note or under media tab). STAT BMP today in clinic showed an improvement in his sodium, 132 from 129 on discharge. I encouraged patient to keep up the good work with fluid restriction. I instructed patient to follow up with physical therapy and to go to the ER if he continues to experience falls. Patient expressed understanding.  -- Continue fluid restriction (2 water bottles a day)  -- Salt tabs BID  -- F/u 1 week for BMP recheck

## 2016-12-28 NOTE — Patient Instructions (Signed)
[name],  It was a pleasure being your doctor today. Please keep up the good work with your fluid restriction. Your sodium today is 132, improved from 129 on discharge. Please continue to work with physical therapy and follow up with general surgery for your drain. I would like for you to return to clinic in 1 week for follow up. If you continue to experience falls at home, please go to the ER for evaluation. We will need to make sure your sodium levels are stable. If you have any questions or concerns, call our clinic at [phone] or after hours call [phone] and ask for the internal medicine resident on call. Thank you!  - Dr. Philipp Ovens   [name],  Fue un placer ser su doctor hoy. Por favor, contine el buen trabajo con su restriccin de lquidos. Su sodio hoy es 132, mejorado de 129 en Transport planner. Contine trabajando con fisioterapia y realice un seguimiento con [name] general para su drenaje. Me gustara que regreses a Copy en 1 semana para dar seguimiento. Si contina experimentando cadas en casa, vaya a la sala de emergencias para su evaluacin. Necesitaremos asegurarnos de que sus niveles de sodio sean estables. Si tiene alguna pregunta o inquietud, llame a [name] al [phone] o despus del horario de atencin llame al [phone] y pregunte por el residente de medicina interna de turno. Gracias!  - Dr. Philipp Ovens

## 2016-12-28 NOTE — Telephone Encounter (Signed)
VO for [facility] CSW, do you agree

## 2016-12-28 NOTE — Assessment & Plan Note (Addendum)
Recent hospital course was complicated by acalculous cholecystitis status post percutaneous drain placement per IR on 12/21/2016. Culture of fluid grew K oxytoca and C perfringens - both sensitive to Zosyn. Patient was maintained on Zosyn for 4 days; due to his quick resolution of signs, symptoms and now normal bile outflow per drain, further antibiotic regimen was not continued. Today on exam, drain is in place draining brown/bilious fluid. He denies any abdominal pain and exam is reassuring. He is tolerating PO without issue. -- F/u general surgery for possible cholecystectomy -- F/u IR for drain

## 2016-12-28 NOTE — Progress Notes (Signed)
Internal Medicine Clinic Attending  Case discussed with Dr. Guilloud at the time of the visit.  We reviewed the resident's history and exam and pertinent patient test results.  I agree with the assessment, diagnosis, and plan of care documented in the resident's note.  
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
## 2016-12-28 NOTE — Assessment & Plan Note (Signed)
Patient was tachycardic to the 120s on initial presentation. After resting, heart rate came down to the low 100s. EKG showed sinus tachycardia, likely secondary to mild dehydration given his fluid restriction. In the setting of his SIADH, we will avoid fluids and observe for now. He is asymptomatic.

## 2016-12-28 NOTE — Assessment & Plan Note (Signed)
Refilled lyrica.  

## 2016-12-31 NOTE — Telephone Encounter (Signed)
Agree 

## 2017-01-02 ENCOUNTER — Telehealth: Payer: Self-pay | Admitting: *Deleted

## 2017-01-02 NOTE — Telephone Encounter (Signed)
PT HH, uvri calls and states pt's HR today upon arrival to home at rest was 102, 02 sat 98%, bp 126/78. Pt denies any/all distress- none noted by PT. She is advised to inform pt to call 911 or go to ED if pt has any distress, she was agreeable. It is noted HR has been elevated at last appt in clinic Please advise, pt has appt 5/18 in clinic

## 2017-01-03 NOTE — Telephone Encounter (Signed)
Agree with RN recommendations. Should keep appt tomorrow and present to ED for CP, SOB, or distress.

## 2017-01-04 ENCOUNTER — Ambulatory Visit (INDEPENDENT_AMBULATORY_CARE_PROVIDER_SITE_OTHER): Payer: Medicare Other | Admitting: Internal Medicine

## 2017-01-04 ENCOUNTER — Encounter: Payer: Self-pay | Admitting: Internal Medicine

## 2017-01-04 ENCOUNTER — Ambulatory Visit (HOSPITAL_COMMUNITY): Payer: Medicare Other

## 2017-01-04 VITALS — BP 139/80 | HR 100 | Temp 98.4°F | Ht 65.0 in | Wt 149.8 lb

## 2017-01-04 DIAGNOSIS — S065XAA Traumatic subdural hemorrhage with loss of consciousness status unknown, initial encounter: Secondary | ICD-10-CM

## 2017-01-04 DIAGNOSIS — E119 Type 2 diabetes mellitus without complications: Secondary | ICD-10-CM | POA: Diagnosis not present

## 2017-01-04 DIAGNOSIS — E222 Syndrome of inappropriate secretion of antidiuretic hormone: Secondary | ICD-10-CM | POA: Diagnosis not present

## 2017-01-04 DIAGNOSIS — Z87891 Personal history of nicotine dependence: Secondary | ICD-10-CM

## 2017-01-04 DIAGNOSIS — S065X9D Traumatic subdural hemorrhage with loss of consciousness of unspecified duration, subsequent encounter: Secondary | ICD-10-CM | POA: Diagnosis not present

## 2017-01-04 DIAGNOSIS — W19XXXD Unspecified fall, subsequent encounter: Secondary | ICD-10-CM | POA: Diagnosis not present

## 2017-01-04 DIAGNOSIS — S065X9A Traumatic subdural hemorrhage with loss of consciousness of unspecified duration, initial encounter: Secondary | ICD-10-CM

## 2017-01-04 DIAGNOSIS — Z79899 Other long term (current) drug therapy: Secondary | ICD-10-CM | POA: Diagnosis not present

## 2017-01-04 DIAGNOSIS — I10 Essential (primary) hypertension: Secondary | ICD-10-CM | POA: Diagnosis not present

## 2017-01-04 LAB — BASIC METABOLIC PANEL
ANION GAP: 6 (ref 5–15)
BUN: 6 mg/dL (ref 6–20)
CALCIUM: 8.8 mg/dL — AB (ref 8.9–10.3)
CO2: 28 mmol/L (ref 22–32)
Chloride: 98 mmol/L — ABNORMAL LOW (ref 101–111)
Creatinine, Ser: 0.79 mg/dL (ref 0.61–1.24)
GFR calc Af Amer: >60 mL/min (ref >60)
GFR calc non Af Amer: >60 mL/min (ref >60)
GLUCOSE: 96 mg/dL (ref 65–99)
Potassium: 4.1 mmol/L (ref 3.5–5.1)
Sodium: 132 mmol/L — ABNORMAL LOW (ref 135–145)

## 2017-01-04 MED ORDER — GLUCOSE BLOOD VI STRP: ORAL_STRIP | 12 refills | Status: DC

## 2017-01-04 NOTE — Assessment & Plan Note (Signed)
Patient was discharged on 12/25/2016. He was admitted for a fall secondary to his SIADH. He was found to have a small subdural hematoma in addition to an inferior orbital fracture. On f/u CT, it was found that he developed a 4.3x2.3cm intraparenchymal hematoma with surrounding vasogenic edema with mild mass effect that remained stable. He was managed non surgically and transferred to the ICU for close monitoring. He was treated with steroids and Keppra for seizure prophylaxis. He was seen in clinic one week ago for hospital follow up. At that time he had experienced two falls. He denied LOC and further head trauma. He was sent home with plans for PT and close clinic follow up. Today, he is complaining of severe headaches and difficulty with his memory. He says he is now forgetting the name of his granddaughter and is scared to leave his home due to problems with confusion. I advised patient that some of these symptoms may be a post-concussive syndrome from his recent head injury that can take 4-6 weeks to resolve, however, given that these headaches seem new from one week ago, we will obtain STAT head CT to re-evaluate his bleed.  -- STAT head CT today  -- If improving or unchanged, follow up 2 weeks for re-evaluation

## 2017-01-04 NOTE — Assessment & Plan Note (Signed)
Sodium is stable today, 132, unchanged from his visit 1 week ago. He reports compliance with his fluid restriction, 1 1/2 - 2 bottles of water day. He is also been taking salt tablets twice daily. He denies any further falls. History of traumatic ecchymoses on his face and neck are resolving.  -- Continue fluid restriction -- Salt tabs BID -- BMP at next visit

## 2017-01-04 NOTE — Assessment & Plan Note (Signed)
Patient is currently taking metoprolol succinate 50 mg daily. His lisinopril was discontinued at his recent discharge due to low BP. Patient has brought an old prescription bottle with him today (lisinopril-HCTZ 20-12.5 mg). He is inquiring about restarting this medicine. He has been experiencing headaches and is concerned his blood pressure has been running high. BP today in clinic is 139/80. I have asked patient to hold off on restarting this medicine for now. We are obtaining a head CT (see subdural hematoma A&P) and will wait for these results.  -- Continue metoprolol succinate 50 mg daily  -- Pending CT head results, consider restarting low dose lisinopril

## 2017-01-04 NOTE — Patient Instructions (Addendum)
[name],  It was a pleasure being your doctor today. I am sorry you are having headaches and problems with your memory. I have ordered a repeat head scan to make sure the bleeding in your brain is stable. I would like for you to hold off of taking the lisinopril-hctz for now. Please continue to take your metoprolol and other medications as previously prescribed. Keep up the good work with your fluid restriction. Continue to take salt tablets twice a day. Please follow up in 2 weeks. I will call you with the results of your blood work today. If you have any questions or concerns, call our clinic at [phone] or after hours call [phone] and ask for the internal medicine resident on call. Thank you!  - Dr. Philipp Ovens   [name], Fue un placer ser su doctor hoy. Lamento que tengas dolores de [place] y problemas con tu memoria. He ordenado una nueva exploracin de la cabeza para asegurarme de que la hemorragia en tu cerebro sea estable. Me gustara que dejaras de tomar el lisinopril-hctz por el momento. Contine tomando su metoprolol y otros medicamentos segn lo recetado previamente. Contina el buen trabajo con tu restriccin de lquidos. Contine tomando tabletas de sal dos veces al da. Por favor haz un seguimiento en 2 semanas. Te llamar hoy con los resultados de tu anlisis de [place]. Si tiene alguna pregunta o inquietud, llame a [name] al [phone] o despus del horario de atencin llame al [phone] y pregunte por el residente de medicina interna de turno. Gracias!
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
## 2017-01-04 NOTE — Assessment & Plan Note (Signed)
Patient is requesting refills of his test strips today. He has a history of type 2 diabetes on metformin. Last A1c checked in September 2017 was 5.9. I advised patient that he does not need to continue checking his blood sugars at home. Patient expressed anxiety over not being able to check his blood glucose, and would like to have the test strips as a safety measure. -- Refilled test strips

## 2017-01-07 NOTE — Progress Notes (Signed)
Internal Medicine Clinic Attending  Case discussed with Dr. Guilloud at the time of the visit.  We reviewed the resident's history and exam and pertinent patient test results.  I agree with the assessment, diagnosis, and plan of care documented in the resident's note.  

## 2017-01-23 NOTE — Telephone Encounter (Signed)
Pt seen 12/28/2016 for HFU.Despina Hidden Cassady6/6/20189:46 AM

## 2017-01-24 ENCOUNTER — Other Ambulatory Visit: Payer: Self-pay | Admitting: *Deleted

## 2017-01-25 ENCOUNTER — Encounter: Payer: Self-pay | Admitting: *Deleted

## 2017-01-25 NOTE — Telephone Encounter (Signed)
Clarified new prescription

## 2017-01-28 MED ORDER — GLUCOSE BLOOD VI STRP: ORAL_STRIP | 12 refills | Status: DC

## 2017-01-28 MED ORDER — ACCU-CHEK AVIVA PLUS W/DEVICE KIT: PACK | 0 refills | Status: DC

## 2017-01-29 ENCOUNTER — Other Ambulatory Visit: Payer: Medicare Other

## 2017-02-05 ENCOUNTER — Ambulatory Visit
Admission: RE | Admit: 2017-02-05 | Discharge: 2017-02-05 | Disposition: A | Discharge status: Home / Self Care | Payer: Medicare Other | Source: Ambulatory Visit | Attending: General Surgery | Admitting: General Surgery

## 2017-02-05 ENCOUNTER — Other Ambulatory Visit: Payer: Self-pay | Admitting: General Surgery

## 2017-02-05 ENCOUNTER — Ambulatory Visit
Admission: RE | Admit: 2017-02-05 | Discharge: 2017-02-05 | Disposition: A | Discharge status: Home / Self Care | Payer: Medicare Other | Source: Ambulatory Visit | Attending: Radiology | Admitting: Radiology

## 2017-02-05 DIAGNOSIS — K819 Cholecystitis, unspecified: Secondary | ICD-10-CM

## 2017-02-05 HISTORY — PX: IR RADIOLOGIST EVAL & MGMT: IMG5224

## 2017-02-05 NOTE — Progress Notes (Signed)
Patient ID: [name], male   DOB: 01-19-1946, 71 y.o.   MRN: [id number] I have spoken to Dr. Redmond Pulling and he is agreeable that if the patient's repeat cholangiogram shows patency, that the drain should be removed at his next visit.  [name] E 3:46 PM 02/05/2017
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
## 2017-02-12 ENCOUNTER — Ambulatory Visit
Admission: RE | Admit: 2017-02-12 | Discharge: 2017-02-12 | Disposition: A | Discharge status: Home / Self Care | Payer: Medicare Other | Source: Ambulatory Visit | Attending: General Surgery | Admitting: General Surgery

## 2017-02-12 DIAGNOSIS — K819 Cholecystitis, unspecified: Secondary | ICD-10-CM

## 2017-02-12 HISTORY — PX: IR RADIOLOGIST EVAL & MGMT: IMG5224

## 2017-02-12 NOTE — Progress Notes (Signed)
Patient ID: [name], male   DOB: May 09, 1946, 71 y.o.   MRN: [id number]   He's done well with capping of his cholecystostomy catheter for 1 week.  Catheter injection today confirms continued patency of the cystic and common bile ducts. No filling the defects in the decompressed gallbladder to suggest cholelithiasis.  After discussion with of the findings with the patient and family member, the catheter was cut and removed uneventfully as previously planned.

## 2017-02-26 ENCOUNTER — Ambulatory Visit (INDEPENDENT_AMBULATORY_CARE_PROVIDER_SITE_OTHER): Payer: Medicare Other | Admitting: Internal Medicine

## 2017-02-26 ENCOUNTER — Ambulatory Visit (HOSPITAL_COMMUNITY)
Admission: RE | Admit: 2017-02-26 | Discharge: 2017-02-26 | Disposition: A | Discharge status: Home / Self Care | Payer: Medicare Other | Source: Ambulatory Visit | Attending: Internal Medicine | Admitting: Internal Medicine

## 2017-02-26 VITALS — BP 141/81 | HR 91 | Temp 97.9°F | Wt 154.5 lb

## 2017-02-26 DIAGNOSIS — G319 Degenerative disease of nervous system, unspecified: Secondary | ICD-10-CM | POA: Insufficient documentation

## 2017-02-26 DIAGNOSIS — W19XXXS Unspecified fall, sequela: Secondary | ICD-10-CM | POA: Diagnosis not present

## 2017-02-26 DIAGNOSIS — R51 Headache: Secondary | ICD-10-CM

## 2017-02-26 DIAGNOSIS — R9082 White matter disease, unspecified: Secondary | ICD-10-CM | POA: Insufficient documentation

## 2017-02-26 DIAGNOSIS — R519 Headache, unspecified: Secondary | ICD-10-CM

## 2017-02-26 DIAGNOSIS — G8929 Other chronic pain: Secondary | ICD-10-CM

## 2017-02-26 DIAGNOSIS — S065X9A Traumatic subdural hemorrhage with loss of consciousness of unspecified duration, initial encounter: Secondary | ICD-10-CM

## 2017-02-26 DIAGNOSIS — Z87891 Personal history of nicotine dependence: Secondary | ICD-10-CM

## 2017-02-26 DIAGNOSIS — S065XAA Traumatic subdural hemorrhage with loss of consciousness status unknown, initial encounter: Secondary | ICD-10-CM

## 2017-02-26 DIAGNOSIS — S065X9S Traumatic subdural hemorrhage with loss of consciousness of unspecified duration, sequela: Secondary | ICD-10-CM

## 2017-02-26 DIAGNOSIS — I6200 Nontraumatic subdural hemorrhage, unspecified: Secondary | ICD-10-CM | POA: Insufficient documentation

## 2017-02-26 MED ORDER — IBUPROFEN 600 MG PO TABS: 600.0000 mg | ORAL_TABLET | Freq: Four times a day (QID) | ORAL | 0 refills | Status: DC | PRN

## 2017-02-26 NOTE — Patient Instructions (Addendum)
[name],  It is a pleasure meeting you today.  Please start taking ibuprofen 600mg  3 times a day for your headaches. It your symptoms do not improve in the next week please follow up in our acute care clinic.
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
## 2017-02-28 DIAGNOSIS — G8929 Other chronic pain: Secondary | ICD-10-CM | POA: Insufficient documentation

## 2017-02-28 DIAGNOSIS — R51 Headache: Secondary | ICD-10-CM

## 2017-02-28 NOTE — Assessment & Plan Note (Addendum)
Assessment: Chronic headaches Patient was admitted on 11/2016 for subdural hematoma from a fall secondary to SIADH.  He has been seen in the clinic twice for follow-up of his medical conditions. He has presented previously for severe headaches and difficulty with memory. At the time a stat CT head was ordered. Patient states that he did not feel comfortable to have the scan at that time due to having a drain in place in his abdomen.  Today patient presents with similar symptoms of headache and memory loss. He states he has difficulty remembering his children's names.  The headaches are stable but constant.  He denies vision changes, N/V, or gait instability.    Plan -stat CT head scan - told to take ibuprofen for headaches, sent prescription to pharmacy  - told to return in one week if headaches to not improve with medication  Addendum:  Patient had stat CT of head that showed no evidence of new intracranial hemorrhage and there was resolution of previous subdural hematoma. There was atrophy and chronic small vessel disease white matter. Ventricles were slightly more prominent with early communicating hydrocephalus not excluded. Will refer to neurosurgery.

## 2017-03-04 NOTE — Progress Notes (Signed)
Internal Medicine Clinic Attending  Case discussed with Dr. Hoffman at the time of the visit.  We reviewed the resident's history and exam and pertinent patient test results.  I agree with the assessment, diagnosis, and plan of care documented in the resident's note.  

## 2017-03-12 ENCOUNTER — Encounter: Payer: Self-pay | Admitting: Internal Medicine

## 2017-03-12 ENCOUNTER — Ambulatory Visit (INDEPENDENT_AMBULATORY_CARE_PROVIDER_SITE_OTHER): Payer: Medicare Other | Admitting: Internal Medicine

## 2017-03-12 VITALS — BP 122/67 | HR 71 | Temp 97.7°F | Wt 158.8 lb

## 2017-03-12 DIAGNOSIS — G44321 Chronic post-traumatic headache, intractable: Secondary | ICD-10-CM | POA: Diagnosis not present

## 2017-03-12 DIAGNOSIS — I10 Essential (primary) hypertension: Secondary | ICD-10-CM | POA: Diagnosis not present

## 2017-03-12 MED ORDER — AMITRIPTYLINE HCL 10 MG PO TABS: 10.0000 mg | ORAL_TABLET | Freq: Every day | ORAL | 0 refills | Status: DC
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
## 2017-03-12 NOTE — Patient Instructions (Addendum)
[name],  It was a pleasure seeing you today.  Please start taking your new medication and follow up in the acute care clinic in 2 weeks.  Please stop taking the medication if you develop muscle spasms, agitation or excessive sweating.

## 2017-03-13 ENCOUNTER — Other Ambulatory Visit: Payer: Self-pay | Admitting: *Deleted

## 2017-03-13 LAB — SEDIMENTATION RATE: Sed Rate: 5 mm/hr (ref 0–30)

## 2017-03-13 MED ORDER — METFORMIN HCL 500 MG PO TABS: 500.0000 mg | ORAL_TABLET | Freq: Two times a day (BID) | ORAL | 3 refills | Status: DC

## 2017-03-16 NOTE — Assessment & Plan Note (Signed)
Assessment:  Chronic headaches Patient was admitted on 11/2016 for subdural hematoma from a fall secondary to SIADH.  On his previous clinic visit on 02/26/17 A stat CT was ordered due to continuing chronic headaches.  The CT scan showed no evidence of new intracranial hemorrhage and there was resolution of previous subdural hematoma. There was atrophy and chronic small vessel disease white matter. Ventricles were slightly more prominent with early communicating hydrocephalus not excluded.  A referral to neurosurgery was made at that time.  Referral is in process currently.  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  Told patient to stop taking medication if he had side effects of muscle spasms, agitation or excessive sweating.  Plan -amitriptyline - ESR

## 2017-03-19 NOTE — Progress Notes (Signed)
Internal Medicine Clinic Attending  Case discussed with Dr. Hoffman at the time of the visit.  We reviewed the resident's history and exam and pertinent patient test results.  I agree with the assessment, diagnosis, and plan of care documented in the resident's note.  

## 2017-04-03 IMAGING — CR DG ABD PORTABLE 1V
1 series · 1 of 1 positions shown · non-contrast
Comparison: None.

CLINICAL DATA: Swelling of the abdomen.  Evaluate for ileus.

EXAM:
PORTABLE ABDOMEN - 1 VIEW

[AP]
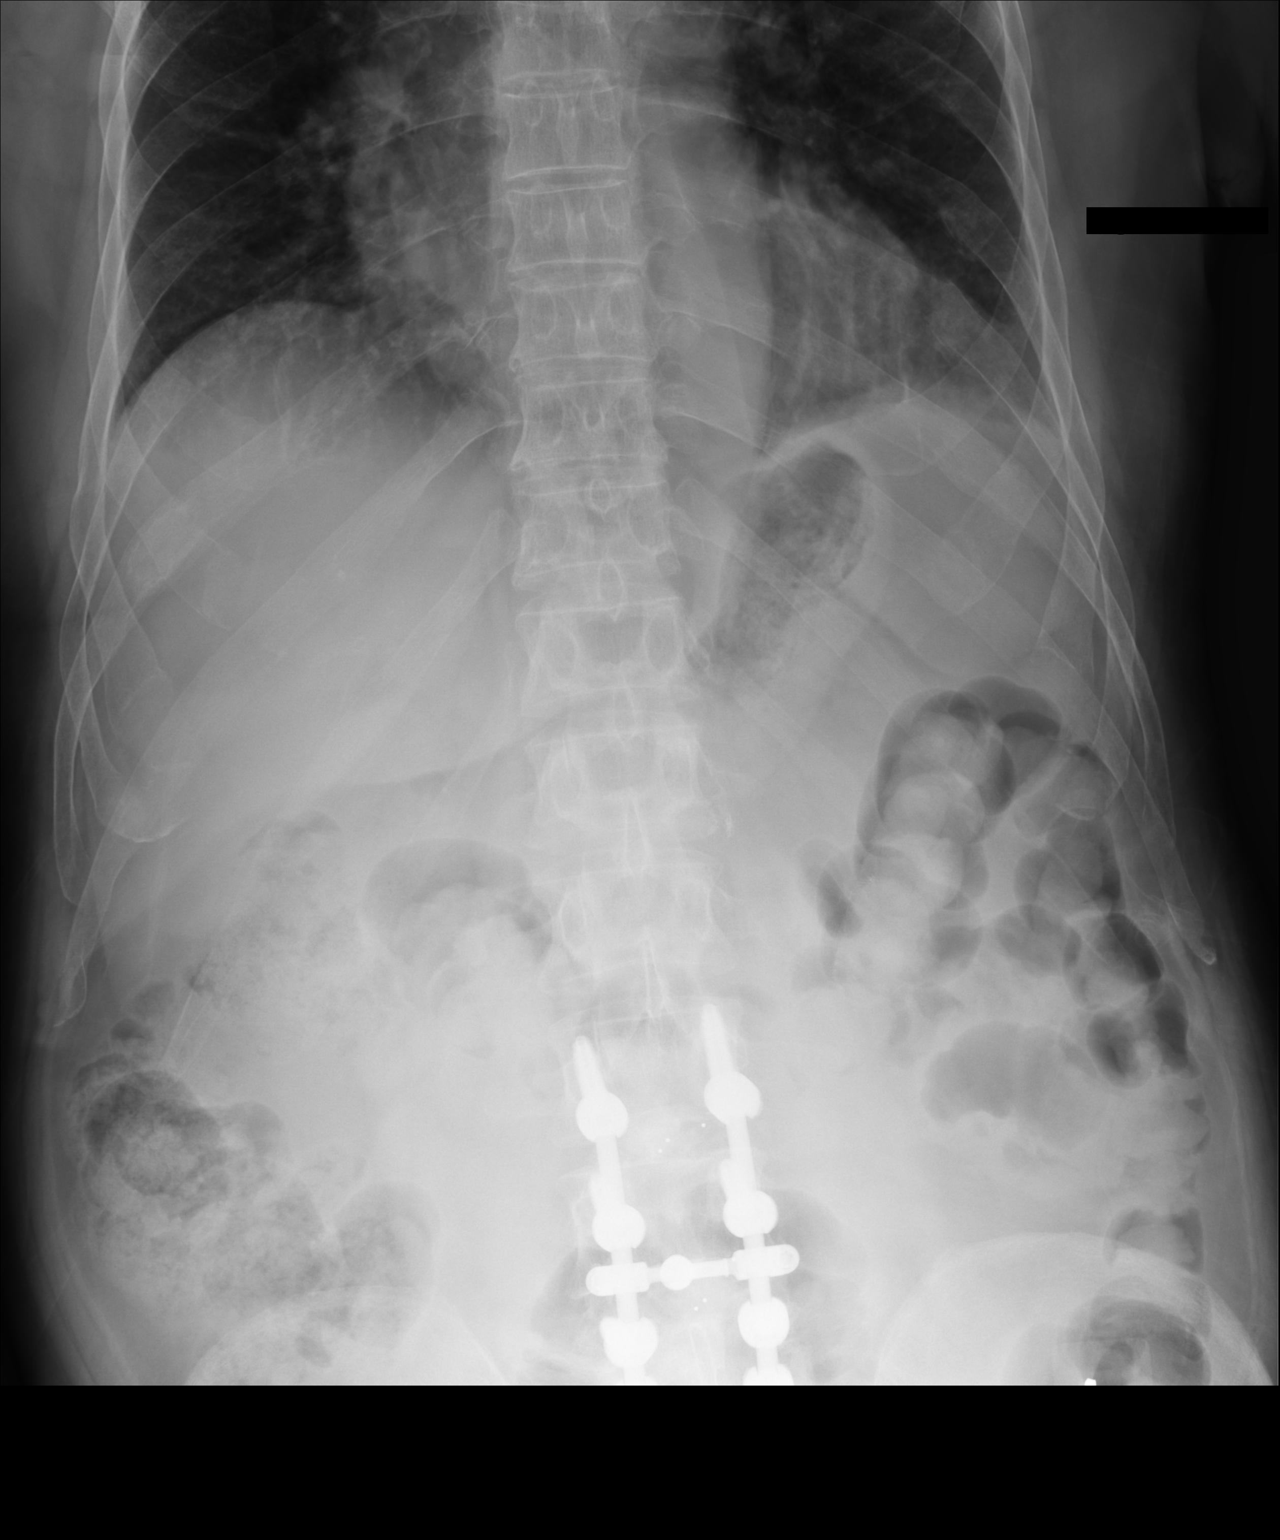

[1 of 1 positions shown; findings below may reference images not displayed]

FINDINGS: There is a moderate amount of stool in the ascending colon. There is
no bowel dilatation to suggest obstruction. There is no evidence of
pneumoperitoneum, portal venous gas or pneumatosis. There are no
pathologic calcifications along the expected course of the ureters.
There is posterior spinal fusion from L3 through S1.
IMPRESSION: No bowel obstruction.

## 2017-04-03 IMAGING — DX DG CHEST 1V PORT
1 series · 1 of 1 positions shown · non-contrast
Comparison: February 22, 2016

CLINICAL DATA: Fever and tachycardia

EXAM:
PORTABLE CHEST 1 VIEW

[chest ap]
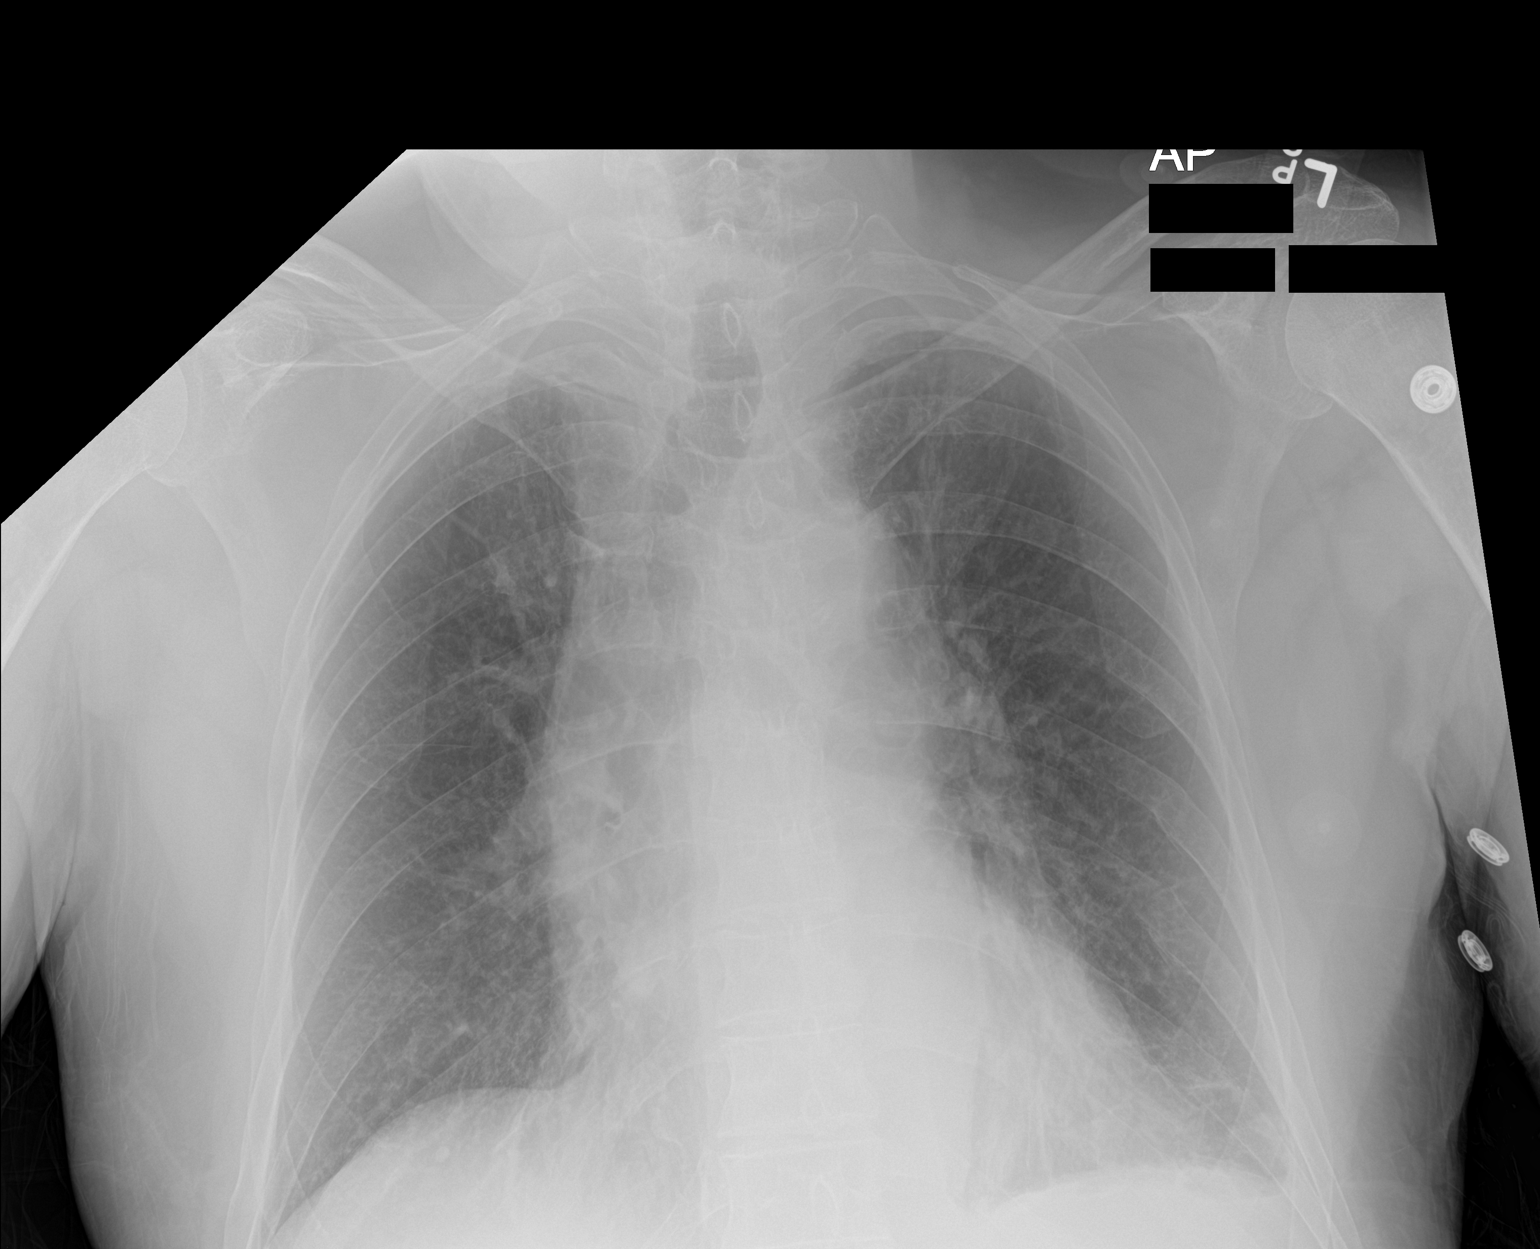

[1 of 1 positions shown; findings below may reference images not displayed]

FINDINGS: There is mild atelectatic change in the left base. Lungs elsewhere
clear. Heart is borderline prominent with pulmonary venous
hypertension. No adenopathy evident. No bone lesions.
IMPRESSION: Evidence of pulmonary vascular congestion. Mild left base
atelectasis. No airspace consolidation.

## 2017-04-08 ENCOUNTER — Encounter: Payer: Self-pay | Admitting: Internal Medicine

## 2017-04-08 ENCOUNTER — Ambulatory Visit (INDEPENDENT_AMBULATORY_CARE_PROVIDER_SITE_OTHER): Payer: Medicare Other | Admitting: Internal Medicine

## 2017-04-08 VITALS — BP 111/71 | HR 83 | Temp 98.0°F | Ht 66.0 in | Wt 158.9 lb

## 2017-04-08 DIAGNOSIS — I10 Essential (primary) hypertension: Secondary | ICD-10-CM

## 2017-04-08 DIAGNOSIS — M48062 Spinal stenosis, lumbar region with neurogenic claudication: Secondary | ICD-10-CM

## 2017-04-08 DIAGNOSIS — E871 Hypo-osmolality and hyponatremia: Secondary | ICD-10-CM | POA: Diagnosis not present

## 2017-04-08 DIAGNOSIS — R413 Other amnesia: Secondary | ICD-10-CM

## 2017-04-08 DIAGNOSIS — F0781 Postconcussional syndrome: Secondary | ICD-10-CM | POA: Insufficient documentation

## 2017-04-08 DIAGNOSIS — N400 Enlarged prostate without lower urinary tract symptoms: Secondary | ICD-10-CM

## 2017-04-08 DIAGNOSIS — Z79899 Other long term (current) drug therapy: Secondary | ICD-10-CM | POA: Diagnosis not present

## 2017-04-08 DIAGNOSIS — Z87820 Personal history of traumatic brain injury: Secondary | ICD-10-CM

## 2017-04-08 DIAGNOSIS — K219 Gastro-esophageal reflux disease without esophagitis: Secondary | ICD-10-CM

## 2017-04-08 DIAGNOSIS — G44309 Post-traumatic headache, unspecified, not intractable: Secondary | ICD-10-CM

## 2017-04-08 DIAGNOSIS — I209 Angina pectoris, unspecified: Secondary | ICD-10-CM

## 2017-04-08 DIAGNOSIS — E119 Type 2 diabetes mellitus without complications: Secondary | ICD-10-CM

## 2017-04-08 MED ORDER — AMITRIPTYLINE HCL 10 MG PO TABS: 10.0000 mg | ORAL_TABLET | Freq: Every day | ORAL | 0 refills | Status: DC

## 2017-04-08 MED ORDER — TAMSULOSIN HCL 0.4 MG PO CAPS: 0.4000 mg | ORAL_CAPSULE | Freq: Every day | ORAL | 0 refills | Status: AC

## 2017-04-08 MED ORDER — VENLAFAXINE HCL ER 37.5 MG PO CP24: 37.5000 mg | ORAL_CAPSULE | Freq: Every day | ORAL | 0 refills | Status: DC

## 2017-04-08 MED ORDER — PREGABALIN 25 MG PO CAPS: 25.0000 mg | ORAL_CAPSULE | Freq: Three times a day (TID) | ORAL | 0 refills | Status: DC

## 2017-04-08 MED ORDER — METFORMIN HCL 500 MG PO TABS: 500.0000 mg | ORAL_TABLET | Freq: Two times a day (BID) | ORAL | 0 refills | Status: DC

## 2017-04-08 MED ORDER — OMEPRAZOLE 20 MG PO CPDR: 20.0000 mg | DELAYED_RELEASE_CAPSULE | Freq: Every day | ORAL | 0 refills | Status: DC

## 2017-04-08 MED ORDER — LISINOPRIL 20 MG PO TABS: 20.0000 mg | ORAL_TABLET | Freq: Every day | ORAL | 0 refills | Status: DC

## 2017-04-08 MED ORDER — METOPROLOL SUCCINATE ER 50 MG PO TB24: 50.0000 mg | ORAL_TABLET | Freq: Every day | ORAL | 0 refills | Status: DC

## 2017-04-08 MED ORDER — ATORVASTATIN CALCIUM 80 MG PO TABS: 80.0000 mg | ORAL_TABLET | Freq: Every day | ORAL | 0 refills | Status: DC

## 2017-04-08 NOTE — Assessment & Plan Note (Signed)
Presents with persistent HA and memory impairment since sustaining a TBI 3 months prior. Brain MRI on 7/20 illustrated improving subdural hematoma. He denies illicit drug use, alcohol use, and tobacco use. His symptoms and prior imaging are not consistent with NPH. His lack of systemic symptoms are reassuring. Based on his history he is likely experiencing post-concussive syndrome.   Plan: - Continue Amitriptyline 10 mg HS - Referral to Neurology  - Checking a BMP due to history of hyponatremia

## 2017-04-08 NOTE — Assessment & Plan Note (Signed)
Symptomatic hyponatremia 3 months prior resulting in traumatic falls. His Na was most recently 132. It is believed that his hyponatremia was 2/2 HCTZ use and further exacerbated by the TBI. Due to his persistent HA and memory impairment we will recheck a BMP today. Chest CT on 3/30 unremarkable for lung pathology   Plan: - Repeat BMP
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
## 2017-04-08 NOTE — Patient Instructions (Signed)
It was a pleasure to meet you.   - Continue to take your medications as prescribed.   - We referred you to neurology.   - I will call you with the results of your blood work.   Sndrome posconmocional (Post-Concussion Syndrome) El sndrome posconmocional se refiere a los sntomas que pueden ocurrir despus de una lesin en la cabeza. Estos sntomas pueden durar desde varias semanas hasta meses. [place] los medicamentos solamente como se lo haya indicado el mdico. No tome aspirina.  Duerma con la cabeza levantada para [employer] de [place].  Evite las actividades que puedan causarle otra lesin cerebral. ? No practique deportes de contacto, como ftbol o ftbol americano, hockey o bsquet. ? No practique otras actividades riesgosas, como esqu extremo, artes marciales o equitacin, hasta que el mdico se lo permita.  Concurra a todas las visitas de control como se lo haya indicado el mdico. Esto es importante. SOLICITE AYUDA SI:  Tiene dificultades para hacer lo siguiente: ? Prestar atencin. ? Concentrarse. ? Recordar. ? Aprender informacin nueva. ? Scientist, research (medical).  Necesita ms tiempo para terminar las tareas.  Se perturba fcilmente (est irritable).  Tiene ms sntomas. Solicite ayuda si presenta cualquiera de estos sntomas durante ms de [employer] de la lesin:  Dolores de cabeza que perduran (crnicos).  Mareos.  Dificultad para mantener el equilibrio.  Ganas de vomitar (nuseas).  Problemas de visin.  Molestias por los ruidos o las luces.  Depresin.  Cambios en el estado de nimo.  Sensacin de preocupacin (ansiedad).  Poca tolerancia general.  Problemas de memoria.  Dificultad para prestar atencin o concentrarse.  Problemas para dormir.  Cansancio permanente. SOLICITE AYUDA DE INMEDIATO SI:  Se siente confundido.  Se siente muy somnoliento.  Le cuesta despertarse.  Tiene Tax inspector.  No deja de vomitar.  Tiene la sensacin de que se est moviendo mientras est quieto (vrtigo).  Tiene movimientos oculares muy rpidos de un lado al otro.  Empieza a temblar (convulsiones) o se desvanece (desmayo).  Le duele la cabeza y no mejora con medicamentos.  No puede mover los brazos o las piernas normalmente.  Uno de los centros negros de los ojos (pupilas) est ms grande que el otro.  Presenta una secrecin clara o con sangre que proviene de la nariz o de los odos.  Los sntomas Warehouse manager de Teacher, English as a foreign language. ASEGRESE DE QUE:  Comprende estas instrucciones.  Controlar su afeccin.  Recibir ayuda de inmediato si no mejora o si empeora. Esta informacin no tiene Marine scientist el consejo del mdico. Asegrese de hacerle al mdico cualquier pregunta que tenga. Document Released: 08/06/2005 Document Revised: 08/27/2014 Document Reviewed: 11/11/2013 Elsevier Interactive Patient Education  2018 [employer].

## 2017-04-09 LAB — BMP8+ANION GAP
ANION GAP: 18 mmol/L (ref 10.0–18.0)
BUN/Creatinine Ratio: 14 (ref 10–24)
BUN: 15 mg/dL (ref 8–27)
CHLORIDE: 93 mmol/L — AB (ref 96–106)
CO2: 23 mmol/L (ref 20–29)
Calcium: 10.1 mg/dL (ref 8.6–10.2)
Creatinine, Ser: 1.09 mg/dL (ref 0.76–1.27)
GFR calc Af Amer: 78 mL/min/{1.73_m2} (ref >59)
GFR, EST NON AFRICAN AMERICAN: 67 mL/min/{1.73_m2} (ref >59)
GLUCOSE: 108 mg/dL — AB (ref 65–99)
POTASSIUM: 4.5 mmol/L (ref 3.5–5.2)
Sodium: 134 mmol/L (ref 134–144)

## 2017-04-12 NOTE — Progress Notes (Signed)
Internal Medicine Clinic Attending  I saw and evaluated the patient.  I personally confirmed the key portions of the history and exam documented by Dr. Tarri Abernethy and I reviewed pertinent patient test results.  The assessment, diagnosis, and plan were formulated together and I agree with the documentation in the resident's note. He does bring in his medications with him today, he now has the Lisinopril medication alone without HCTZ.  He is not taking salt tabs or restricting water intake.  He is still having headaches. Rechecked BMP: Na now back to normal. Overall I suspect the multiple admissions and the reason that lead to his fall was hyponatremia due to thiazide diuretic, will add this to his allergy list.  Given his persisent headaches will refer him to neurology.

## 2017-04-19 ENCOUNTER — Emergency Department (HOSPITAL_COMMUNITY): Payer: Medicare Other

## 2017-04-19 ENCOUNTER — Emergency Department (HOSPITAL_COMMUNITY)
Admission: EM | Admit: 2017-04-19 | Discharge: 2017-04-19 | Disposition: A | Discharge status: Home / Self Care | Payer: Medicare Other | Attending: Emergency Medicine | Admitting: Emergency Medicine

## 2017-04-19 ENCOUNTER — Encounter (HOSPITAL_COMMUNITY): Payer: Self-pay

## 2017-04-19 DIAGNOSIS — R51 Headache: Secondary | ICD-10-CM | POA: Insufficient documentation

## 2017-04-19 DIAGNOSIS — Z5321 Procedure and treatment not carried out due to patient leaving prior to being seen by health care provider: Secondary | ICD-10-CM | POA: Diagnosis not present

## 2017-04-19 DIAGNOSIS — R413 Other amnesia: Secondary | ICD-10-CM | POA: Diagnosis not present
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
## 2017-04-19 NOTE — ED Notes (Signed)
Pt and daughter no longer wanted to wait. Encouraged if they were to leave that the patient needed to follow-up with primary care

## 2017-04-30 ENCOUNTER — Ambulatory Visit (INDEPENDENT_AMBULATORY_CARE_PROVIDER_SITE_OTHER): Payer: Medicare Other | Admitting: Internal Medicine

## 2017-04-30 VITALS — BP 140/82 | HR 73 | Temp 97.7°F | Ht 69.0 in | Wt 157.6 lb

## 2017-04-30 DIAGNOSIS — I10 Essential (primary) hypertension: Secondary | ICD-10-CM | POA: Diagnosis not present

## 2017-04-30 DIAGNOSIS — K219 Gastro-esophageal reflux disease without esophagitis: Secondary | ICD-10-CM

## 2017-04-30 DIAGNOSIS — F0781 Postconcussional syndrome: Secondary | ICD-10-CM

## 2017-04-30 DIAGNOSIS — M545 Low back pain, unspecified: Secondary | ICD-10-CM

## 2017-04-30 DIAGNOSIS — M546 Pain in thoracic spine: Secondary | ICD-10-CM

## 2017-04-30 DIAGNOSIS — Z87891 Personal history of nicotine dependence: Secondary | ICD-10-CM | POA: Diagnosis not present

## 2017-04-30 DIAGNOSIS — E119 Type 2 diabetes mellitus without complications: Secondary | ICD-10-CM

## 2017-04-30 DIAGNOSIS — Z79899 Other long term (current) drug therapy: Secondary | ICD-10-CM | POA: Diagnosis not present

## 2017-04-30 DIAGNOSIS — Z8249 Family history of ischemic heart disease and other diseases of the circulatory system: Secondary | ICD-10-CM

## 2017-04-30 DIAGNOSIS — M48062 Spinal stenosis, lumbar region with neurogenic claudication: Secondary | ICD-10-CM

## 2017-04-30 DIAGNOSIS — Z833 Family history of diabetes mellitus: Secondary | ICD-10-CM

## 2017-04-30 MED ORDER — AMITRIPTYLINE HCL 25 MG PO TABS: 25.0000 mg | ORAL_TABLET | Freq: Every day | ORAL | 0 refills | Status: DC
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
## 2017-04-30 NOTE — Patient Instructions (Addendum)
For your headaches, take amitriptyline 25mg  at bedtime. We will see you in one month to see how the change is working.  Here is the information for your neurology appointment: Neurology Nov 30th at 8:15 AM with Dr. Creig Hines for headaches. [address]  [address]  [place], [place] [address]  [phone]  For your back, start using heat multiple times a day.

## 2017-05-01 ENCOUNTER — Encounter: Payer: Self-pay | Admitting: Internal Medicine

## 2017-05-01 DIAGNOSIS — M545 Low back pain, unspecified: Secondary | ICD-10-CM | POA: Insufficient documentation

## 2017-05-01 DIAGNOSIS — M546 Pain in thoracic spine: Secondary | ICD-10-CM | POA: Insufficient documentation

## 2017-05-01 NOTE — Assessment & Plan Note (Signed)
Patient with continued chronic headache following TBI consistent with post-concussive syndrome. He was started on amitriptyline 10mg  qhs a couple of months ago without improvement in his symptoms. He has no new neurological deficits and has stable memory issues over the last few months.   Plan: --increase amitriptyline to 25mg  qhs --patient given information about his neurology appointment on 07/19/17 --f/u in 1 month for effect, can further increase dose

## 2017-05-01 NOTE — Assessment & Plan Note (Signed)
Patient with ~12 yr history of chronic thoracolumbar pain with radiation to bil LEs, s/p central laminectomies at L3-4 and L4-5 for posterior decompression. He is on lyrica which he states does help the pain some but it is overall unchanged. He only had PT for his back pain immediately post-op.   Last CT with myelogram in 12/2015 shows mild thoracic spondylosis and facet arthrosis with some neural foraminal stenosis; prior decompressions without restenosis; and bilateral foraminal stenosis at L3-4, 4-5 and 5-S1. He does not have any alarm signs or symptoms to prompt re-imaging.  Plan: --there is room to go up on lyrica, however with amitriptyline increase may be too sedating. Patient in agreement with just amitriptyline increase for headaches as they are more debilitating and he may get some back pain relief as well. --patient to start using heating pad on his back --patient declines PT referral today; he wants to see how the medication change will affect his back pain first; please re-address at f/u.

## 2017-05-02 NOTE — Progress Notes (Signed)
Internal Medicine Clinic Attending  Case discussed with Dr. Svalina  at the time of the visit.  We reviewed the resident's history and exam and pertinent patient test results.  I agree with the assessment, diagnosis, and plan of care documented in the resident's note.  

## 2017-05-03 ENCOUNTER — Encounter (HOSPITAL_COMMUNITY): Payer: Self-pay

## 2017-05-03 ENCOUNTER — Observation Stay (HOSPITAL_COMMUNITY)
Admission: EM | Admit: 2017-05-03 | Discharge: 2017-05-05 | Disposition: A | Discharge status: Home / Self Care | Payer: Medicare Other | Attending: Internal Medicine | Admitting: Internal Medicine

## 2017-05-03 ENCOUNTER — Emergency Department (HOSPITAL_COMMUNITY): Payer: Medicare Other

## 2017-05-03 DIAGNOSIS — R4182 Altered mental status, unspecified: Secondary | ICD-10-CM | POA: Diagnosis not present

## 2017-05-03 DIAGNOSIS — F329 Major depressive disorder, single episode, unspecified: Secondary | ICD-10-CM | POA: Diagnosis not present

## 2017-05-03 DIAGNOSIS — I2510 Atherosclerotic heart disease of native coronary artery without angina pectoris: Secondary | ICD-10-CM | POA: Insufficient documentation

## 2017-05-03 DIAGNOSIS — R270 Ataxia, unspecified: Secondary | ICD-10-CM | POA: Diagnosis not present

## 2017-05-03 DIAGNOSIS — R51 Headache: Secondary | ICD-10-CM | POA: Diagnosis not present

## 2017-05-03 DIAGNOSIS — I10 Essential (primary) hypertension: Secondary | ICD-10-CM | POA: Insufficient documentation

## 2017-05-03 DIAGNOSIS — Z79899 Other long term (current) drug therapy: Secondary | ICD-10-CM | POA: Diagnosis not present

## 2017-05-03 DIAGNOSIS — Z6824 Body mass index (BMI) 24.0-24.9, adult: Secondary | ICD-10-CM | POA: Insufficient documentation

## 2017-05-03 DIAGNOSIS — E876 Hypokalemia: Secondary | ICD-10-CM | POA: Diagnosis present

## 2017-05-03 DIAGNOSIS — Z87891 Personal history of nicotine dependence: Secondary | ICD-10-CM | POA: Diagnosis not present

## 2017-05-03 DIAGNOSIS — R630 Anorexia: Secondary | ICD-10-CM | POA: Diagnosis not present

## 2017-05-03 DIAGNOSIS — J439 Emphysema, unspecified: Secondary | ICD-10-CM | POA: Diagnosis not present

## 2017-05-03 DIAGNOSIS — E119 Type 2 diabetes mellitus without complications: Secondary | ICD-10-CM | POA: Diagnosis not present

## 2017-05-03 DIAGNOSIS — R413 Other amnesia: Secondary | ICD-10-CM | POA: Diagnosis present

## 2017-05-03 DIAGNOSIS — E871 Hypo-osmolality and hyponatremia: Secondary | ICD-10-CM

## 2017-05-03 DIAGNOSIS — K219 Gastro-esophageal reflux disease without esophagitis: Secondary | ICD-10-CM | POA: Diagnosis not present

## 2017-05-03 DIAGNOSIS — D509 Iron deficiency anemia, unspecified: Secondary | ICD-10-CM | POA: Insufficient documentation

## 2017-05-03 DIAGNOSIS — Z7982 Long term (current) use of aspirin: Secondary | ICD-10-CM | POA: Insufficient documentation

## 2017-05-03 HISTORY — DX: Hypo-osmolality and hyponatremia: E87.1

## 2017-05-03 LAB — COMPREHENSIVE METABOLIC PANEL
ALBUMIN: 3.7 g/dL (ref 3.5–5.0)
ALT: 24 U/L (ref 17–63)
AST: 31 U/L (ref 15–41)
Alkaline Phosphatase: 79 U/L (ref 38–126)
Anion gap: 14 (ref 5–15)
BUN: 11 mg/dL (ref 6–20)
CHLORIDE: 87 mmol/L — AB (ref 101–111)
CO2: 20 mmol/L — AB (ref 22–32)
Calcium: 8.9 mg/dL (ref 8.9–10.3)
Creatinine, Ser: 1 mg/dL (ref 0.61–1.24)
GFR calc Af Amer: >60 mL/min (ref >60)
GFR calc non Af Amer: >60 mL/min (ref >60)
GLUCOSE: 109 mg/dL — AB (ref 65–99)
Potassium: 4.2 mmol/L (ref 3.5–5.1)
Sodium: 121 mmol/L — ABNORMAL LOW (ref 135–145)
Total Bilirubin: 1.3 mg/dL — ABNORMAL HIGH (ref 0.3–1.2)
Total Protein: 7.7 g/dL (ref 6.5–8.1)

## 2017-05-03 LAB — CBC
HCT: 43.1 % (ref 39.0–52.0)
Hemoglobin: 15.4 g/dL (ref 13.0–17.0)
MCH: 29.7 pg (ref 26.0–34.0)
MCHC: 35.7 g/dL (ref 30.0–36.0)
MCV: 83.2 fL (ref 78.0–100.0)
Platelets: 186 10*3/uL (ref 150–400)
RBC: 5.18 MIL/uL (ref 4.22–5.81)
RDW: 13.3 % (ref 11.5–15.5)
WBC: 15.1 10*3/uL — AB (ref 4.0–10.5)

## 2017-05-03 LAB — DIFFERENTIAL
BASOS PCT: 0 %
Basophils Absolute: 0 10*3/uL (ref 0.0–0.1)
EOS ABS: 0 10*3/uL (ref 0.0–0.7)
Eosinophils Relative: 0 %
Lymphocytes Relative: 8 %
Lymphs Abs: 1.1 10*3/uL (ref 0.7–4.0)
Monocytes Absolute: 1.2 10*3/uL — ABNORMAL HIGH (ref 0.1–1.0)
Monocytes Relative: 8 %
NEUTROS ABS: 12.8 10*3/uL — AB (ref 1.7–7.7)
NEUTROS PCT: 84 %

## 2017-05-03 LAB — I-STAT CHEM 8, ED
BUN: 13 mg/dL (ref 6–20)
CHLORIDE: 88 mmol/L — AB (ref 101–111)
CREATININE: 0.8 mg/dL (ref 0.61–1.24)
Calcium, Ion: 1.04 mmol/L — ABNORMAL LOW (ref 1.15–1.40)
Glucose, Bld: 115 mg/dL — ABNORMAL HIGH (ref 65–99)
HEMATOCRIT: 48 % (ref 39.0–52.0)
HEMOGLOBIN: 16.3 g/dL (ref 13.0–17.0)
POTASSIUM: 4.2 mmol/L (ref 3.5–5.1)
Sodium: 123 mmol/L — ABNORMAL LOW (ref 135–145)
TCO2: 25 mmol/L (ref 22–32)

## 2017-05-03 LAB — APTT: APTT: 23 s — AB (ref 24–36)

## 2017-05-03 LAB — PROTIME-INR
INR: 1.08
Prothrombin Time: 13.9 seconds (ref 11.4–15.2)

## 2017-05-03 LAB — I-STAT TROPONIN, ED: Troponin i, poc: 0 ng/mL (ref 0.00–0.08)

## 2017-05-03 MED ORDER — SODIUM CHLORIDE 0.9 % IV SOLN
1000.0000 mL | INTRAVENOUS | Status: DC
Start: 2017-05-04 — End: 2017-05-04

## 2017-05-03 MED ORDER — SODIUM CHLORIDE 0.9 % IV BOLUS (SEPSIS)
1000.0000 mL | Freq: Once | INTRAVENOUS | Status: DC
  Administered 2017-05-04: 1000 mL via INTRAVENOUS
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
## 2017-05-04 DIAGNOSIS — I10 Essential (primary) hypertension: Secondary | ICD-10-CM | POA: Diagnosis not present

## 2017-05-04 DIAGNOSIS — Z833 Family history of diabetes mellitus: Secondary | ICD-10-CM

## 2017-05-04 DIAGNOSIS — R51 Headache: Secondary | ICD-10-CM | POA: Diagnosis not present

## 2017-05-04 DIAGNOSIS — D509 Iron deficiency anemia, unspecified: Secondary | ICD-10-CM

## 2017-05-04 DIAGNOSIS — M4716 Other spondylosis with myelopathy, lumbar region: Secondary | ICD-10-CM

## 2017-05-04 DIAGNOSIS — E876 Hypokalemia: Secondary | ICD-10-CM | POA: Diagnosis present

## 2017-05-04 DIAGNOSIS — R4182 Altered mental status, unspecified: Secondary | ICD-10-CM | POA: Diagnosis present

## 2017-05-04 DIAGNOSIS — Z87891 Personal history of nicotine dependence: Secondary | ICD-10-CM

## 2017-05-04 DIAGNOSIS — K902 Blind loop syndrome, not elsewhere classified: Secondary | ICD-10-CM | POA: Diagnosis not present

## 2017-05-04 DIAGNOSIS — E871 Hypo-osmolality and hyponatremia: Secondary | ICD-10-CM

## 2017-05-04 DIAGNOSIS — Z8639 Personal history of other endocrine, nutritional and metabolic disease: Secondary | ICD-10-CM

## 2017-05-04 DIAGNOSIS — I2510 Atherosclerotic heart disease of native coronary artery without angina pectoris: Secondary | ICD-10-CM

## 2017-05-04 DIAGNOSIS — F329 Major depressive disorder, single episode, unspecified: Secondary | ICD-10-CM

## 2017-05-04 DIAGNOSIS — Z87820 Personal history of traumatic brain injury: Secondary | ICD-10-CM

## 2017-05-04 DIAGNOSIS — E119 Type 2 diabetes mellitus without complications: Secondary | ICD-10-CM

## 2017-05-04 DIAGNOSIS — Z8049 Family history of malignant neoplasm of other genital organs: Secondary | ICD-10-CM

## 2017-05-04 DIAGNOSIS — Z8249 Family history of ischemic heart disease and other diseases of the circulatory system: Secondary | ICD-10-CM

## 2017-05-04 DIAGNOSIS — Z800 Family history of malignant neoplasm of digestive organs: Secondary | ICD-10-CM

## 2017-05-04 DIAGNOSIS — Z888 Allergy status to other drugs, medicaments and biological substances status: Secondary | ICD-10-CM

## 2017-05-04 LAB — BASIC METABOLIC PANEL
ANION GAP: 11 (ref 5–15)
ANION GAP: 8 (ref 5–15)
Anion gap: 12 (ref 5–15)
Anion gap: 12 (ref 5–15)
BUN: 10 mg/dL (ref 6–20)
BUN: 11 mg/dL (ref 6–20)
BUN: 11 mg/dL (ref 6–20)
BUN: 9 mg/dL (ref 6–20)
CALCIUM: 8.1 mg/dL — AB (ref 8.9–10.3)
CHLORIDE: 88 mmol/L — AB (ref 101–111)
CHLORIDE: 90 mmol/L — AB (ref 101–111)
CHLORIDE: 97 mmol/L — AB (ref 101–111)
CO2: 19 mmol/L — ABNORMAL LOW (ref 22–32)
CO2: 21 mmol/L — AB (ref 22–32)
CO2: 22 mmol/L (ref 22–32)
CO2: 23 mmol/L (ref 22–32)
Calcium: 8.9 mg/dL (ref 8.9–10.3)
Calcium: 8.6 mg/dL — ABNORMAL LOW (ref 8.9–10.3)
Calcium: 8 mg/dL — ABNORMAL LOW (ref 8.9–10.3)
Chloride: 98 mmol/L — ABNORMAL LOW (ref 101–111)
Creatinine, Ser: 1 mg/dL (ref 0.61–1.24)
Creatinine, Ser: 0.89 mg/dL (ref 0.61–1.24)
Creatinine, Ser: 0.83 mg/dL (ref 0.61–1.24)
Creatinine, Ser: 1.03 mg/dL (ref 0.61–1.24)
GFR calc Af Amer: >60 mL/min (ref >60)
GFR calc Af Amer: >60 mL/min (ref >60)
GFR calc Af Amer: >60 mL/min (ref >60)
GFR calc non Af Amer: >60 mL/min (ref >60)
GFR calc non Af Amer: >60 mL/min (ref >60)
GFR calc non Af Amer: >60 mL/min (ref >60)
GLUCOSE: 161 mg/dL — AB (ref 65–99)
GLUCOSE: 91 mg/dL (ref 65–99)
GLUCOSE: 98 mg/dL (ref 65–99)
Glucose, Bld: 124 mg/dL — ABNORMAL HIGH (ref 65–99)
POTASSIUM: 3.2 mmol/L — AB (ref 3.5–5.1)
POTASSIUM: 3.4 mmol/L — AB (ref 3.5–5.1)
POTASSIUM: 4 mmol/L (ref 3.5–5.1)
POTASSIUM: 4.3 mmol/L (ref 3.5–5.1)
SODIUM: 128 mmol/L — AB (ref 135–145)
Sodium: 123 mmol/L — ABNORMAL LOW (ref 135–145)
Sodium: 123 mmol/L — ABNORMAL LOW (ref 135–145)
Sodium: 127 mmol/L — ABNORMAL LOW (ref 135–145)

## 2017-05-04 LAB — TSH: TSH: 0.619 u[IU]/mL (ref 0.350–4.500)

## 2017-05-04 LAB — OSMOLALITY, URINE: OSMOLALITY UR: 598 mosm/kg (ref 300–900)

## 2017-05-04 LAB — SODIUM, URINE, RANDOM: SODIUM UR: 26 mmol/L

## 2017-05-04 LAB — T4, FREE: FREE T4: 1.23 ng/dL — AB (ref 0.61–1.12)

## 2017-05-04 LAB — PHOSPHORUS: Phosphorus: 3.2 mg/dL (ref 2.5–4.6)

## 2017-05-04 LAB — OSMOLALITY: OSMOLALITY: 259 mosm/kg — AB (ref 275–295)

## 2017-05-04 LAB — MAGNESIUM: Magnesium: 2.1 mg/dL (ref 1.7–2.4)

## 2017-05-04 MED ORDER — METOPROLOL SUCCINATE ER 25 MG PO TB24
50.0000 mg | ORAL_TABLET | Freq: Every day | ORAL | Status: DC
  Administered 2017-05-04 – 2017-05-05 (×2): 50 mg via ORAL
  Filled 2017-05-04 (×2): qty 2

## 2017-05-04 MED ORDER — ACETAMINOPHEN 325 MG PO TABS: 650.0000 mg | ORAL_TABLET | Freq: Four times a day (QID) | ORAL | Status: DC | PRN

## 2017-05-04 MED ORDER — FERROUS SULFATE 325 (65 FE) MG PO TABS
325.0000 mg | ORAL_TABLET | Freq: Two times a day (BID) | ORAL | Status: DC
  Administered 2017-05-04 – 2017-05-05 (×3): 325 mg via ORAL
  Filled 2017-05-04 (×3): qty 1

## 2017-05-04 MED ORDER — ENOXAPARIN SODIUM 40 MG/0.4ML ~~LOC~~ SOLN
40.0000 mg | SUBCUTANEOUS | Status: DC
  Administered 2017-05-04 – 2017-05-05 (×2): 40 mg via SUBCUTANEOUS
  Filled 2017-05-04 (×2): qty 0.4

## 2017-05-04 MED ORDER — SENNOSIDES-DOCUSATE SODIUM 8.6-50 MG PO TABS: 1.0000 | ORAL_TABLET | Freq: Every evening | ORAL | Status: DC | PRN

## 2017-05-04 MED ORDER — ASPIRIN 81 MG PO CHEW: 81.0000 mg | CHEWABLE_TABLET | Freq: Every day | ORAL | Status: DC

## 2017-05-04 MED ORDER — SODIUM CHLORIDE 1 G PO TABS
3.0000 g | ORAL_TABLET | Freq: Three times a day (TID) | ORAL | Status: DC
  Administered 2017-05-04 – 2017-05-05 (×6): 3 g via ORAL
  Filled 2017-05-04 (×6): qty 3

## 2017-05-04 MED ORDER — SENNOSIDES-DOCUSATE SODIUM 8.6-50 MG PO TABS
2.0000 | ORAL_TABLET | Freq: Every day | ORAL | Status: DC
  Administered 2017-05-04 – 2017-05-05 (×2): 2 via ORAL
  Filled 2017-05-04 (×2): qty 2

## 2017-05-04 MED ORDER — PREGABALIN 25 MG PO CAPS
25.0000 mg | ORAL_CAPSULE | Freq: Three times a day (TID) | ORAL | Status: DC
  Administered 2017-05-04 – 2017-05-05 (×5): 25 mg via ORAL
  Filled 2017-05-04 (×5): qty 1

## 2017-05-04 MED ORDER — ACETAMINOPHEN 500 MG PO TABS: 1000.0000 mg | ORAL_TABLET | Freq: Four times a day (QID) | ORAL | Status: DC | PRN

## 2017-05-04 MED ORDER — ASPIRIN-ACETAMINOPHEN-CAFFEINE 250-250-65 MG PO TABS
1.0000 | ORAL_TABLET | Freq: Four times a day (QID) | ORAL | Status: DC | PRN
  Filled 2017-05-04: qty 1

## 2017-05-04 MED ORDER — ATORVASTATIN CALCIUM 80 MG PO TABS
80.0000 mg | ORAL_TABLET | Freq: Every day | ORAL | Status: DC
Start: 2017-05-04 — End: 2017-05-05
  Administered 2017-05-04 – 2017-05-05 (×2): 80 mg via ORAL
  Filled 2017-05-04 (×2): qty 1

## 2017-05-04 MED ORDER — ACETAMINOPHEN 650 MG RE SUPP: 650.0000 mg | Freq: Four times a day (QID) | RECTAL | Status: DC | PRN

## 2017-05-04 MED ORDER — SODIUM CHLORIDE 0.9 % IV BOLUS (SEPSIS)
1000.0000 mL | Freq: Once | INTRAVENOUS | Status: AC
  Administered 2017-05-04: 1000 mL via INTRAVENOUS

## 2017-05-04 MED ORDER — METOCLOPRAMIDE HCL 5 MG/ML IJ SOLN
5.0000 mg | Freq: Once | INTRAMUSCULAR | Status: AC
  Administered 2017-05-04: 5 mg via INTRAVENOUS
  Filled 2017-05-04: qty 2

## 2017-05-04 MED ORDER — IBUPROFEN 200 MG PO TABS
600.0000 mg | ORAL_TABLET | Freq: Once | ORAL | Status: AC
  Administered 2017-05-04: 600 mg via ORAL
  Filled 2017-05-04: qty 3

## 2017-05-04 MED ORDER — LISINOPRIL 20 MG PO TABS
20.0000 mg | ORAL_TABLET | Freq: Every day | ORAL | Status: DC
Start: 2017-05-04 — End: 2017-05-05
  Administered 2017-05-04 – 2017-05-05 (×2): 20 mg via ORAL
  Filled 2017-05-04 (×2): qty 1

## 2017-05-04 MED ORDER — POTASSIUM CHLORIDE CRYS ER 20 MEQ PO TBCR
30.0000 meq | EXTENDED_RELEASE_TABLET | ORAL | Status: AC
  Administered 2017-05-04 (×2): 30 meq via ORAL
  Filled 2017-05-04 (×2): qty 1

## 2017-05-04 MED ORDER — TAMSULOSIN HCL 0.4 MG PO CAPS
0.4000 mg | ORAL_CAPSULE | Freq: Every day | ORAL | Status: DC
  Administered 2017-05-04 – 2017-05-05 (×2): 0.4 mg via ORAL
  Filled 2017-05-04 (×2): qty 1

## 2017-05-04 NOTE — ED Notes (Signed)
MD at bedside. 

## 2017-05-04 NOTE — Progress Notes (Signed)
   Subjective: patient was feeling better when lying down in bed. He does become dizzy with walking around. According to his granddaughter he was restricting his fluid very well according to the advice given during his previous hospitalization and taking his salt tablets. According to her he was not eating well either and appeared fatigued all the time.  Objective:  Vital signs in last 24 hours: Vitals:   05/04/17 0127 05/04/17 0222 05/04/17 0516 05/04/17 0813  BP:  (!) 151/62 (!) 144/71   Pulse:  98 91   Resp:  20 18 19   Temp: 98.3 F (36.8 C) 98.6 F (37 C) 98.3 F (36.8 C) 98.4 F (36.9 C)  TempSrc:  Oral Oral Oral  SpO2:  97% 98% 96%  Weight:  149 lb 0.5 oz (67.6 kg)    Height:  5\' 6"  (1.676 m)     Orthostatic VS for the past 24 hrs:  BP- Lying Pulse- Lying BP- Sitting Pulse- Sitting BP- Standing at 0 minutes Pulse- Standing at 0 minutes  05/04/17 0813 141/60 103 124/69 123 99/52 149  05/04/17 0723 141/60 103 124/69 123 99/52 149   Gen. Well-developed, frial looking elderly man, lying comfortably in his bed, in no acute distress.mucous membrane and skin turgor appears dry. Chest.clear bilaterally. CV. Regular rate and rhythm. Abdomen. Soft, non tender, bowel sounds positive. Extremities. No edema, no cyanosis, pulses intact and symmetrical.   Assessment/Plan:  71 y.o m presenting with altered mental status and hyponatremia.   Altered mental status,  Hyponatremia. 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 Patient has an history of subdural hemorrhage, which can result in cerebral salt wasting as ADH released in response to low volume, it is normally transient but can persist for longer time.thyroid or adrenal dysfunction was ruled out during previous hospitalization. His repeat TSH today was 0.6, remained within lower normal limit but decreased from previous check of TSH which was  1.435 months ago, subclinical hypothyroidism can be a possibility, we will check free T4. Currently patient appears dry, his orthostatic vitals were positive which are more consistent with hypovolumic hyponatremia. If he has cerebral salt wasting he needs to avoid hypovolemia as it can increase ADH secretions resulted in hyponatremia. -we will give him another bolus of normal saline. -Recheck BMP around noon, he might need more fluids. -we will keep holding amitriptyline and venlafaxine.  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 The patient's reported longstanding history of headache has not responded to NSAIDS or tylenol.most likely a sequelae of his subdural hematoma,hypovolemia can be playing a role. His headache was better when seen this morning. -continue when necessary neck strain and metoclopramide.  Essential Hypertension.He was having mildly elevated blood pressure today. -continue home dose of lisinopril and metoprolol.  Type 2 Diabetes Mellitus. He has well-controlled diabetes. The patient's glu=109 on admission and 91 on recheck, his last HbA1c=5.9. Patient takes metformin 500mg  bid. -we will monitor his blood glucose and intervene if needed.  Dispo: Anticipated discharge in approximately 1-2 day(s).   Lorella Nimrod, MD 05/04/2017, 11:33 AM Pager: [phone]

## 2017-05-04 NOTE — ED Notes (Signed)
[phone] - Mal Amabile (granddaughter)  [phone] - Sherrine Maples (son)
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
## 2017-05-04 NOTE — ED Notes (Signed)
Admitting at bedside 

## 2017-05-05 ENCOUNTER — Encounter (HOSPITAL_COMMUNITY): Payer: Self-pay | Admitting: Internal Medicine

## 2017-05-05 DIAGNOSIS — E861 Hypovolemia: Secondary | ICD-10-CM

## 2017-05-05 DIAGNOSIS — R4182 Altered mental status, unspecified: Secondary | ICD-10-CM | POA: Diagnosis not present

## 2017-05-05 DIAGNOSIS — Z888 Allergy status to other drugs, medicaments and biological substances status: Secondary | ICD-10-CM | POA: Diagnosis not present

## 2017-05-05 DIAGNOSIS — E871 Hypo-osmolality and hyponatremia: Secondary | ICD-10-CM | POA: Diagnosis present

## 2017-05-05 LAB — BASIC METABOLIC PANEL
Anion gap: 6 (ref 5–15)
BUN: 13 mg/dL (ref 6–20)
CHLORIDE: 103 mmol/L (ref 101–111)
CO2: 21 mmol/L — ABNORMAL LOW (ref 22–32)
Calcium: 8.2 mg/dL — ABNORMAL LOW (ref 8.9–10.3)
Creatinine, Ser: 0.75 mg/dL (ref 0.61–1.24)
GFR calc Af Amer: >60 mL/min (ref >60)
GFR calc non Af Amer: >60 mL/min (ref >60)
Glucose, Bld: 96 mg/dL (ref 65–99)
POTASSIUM: 4.1 mmol/L (ref 3.5–5.1)
SODIUM: 130 mmol/L — AB (ref 135–145)

## 2017-05-05 LAB — LIPID PANEL
CHOL/HDL RATIO: 4.7 ratio
CHOLESTEROL: 103 mg/dL (ref 0–200)
HDL: 22 mg/dL — AB (ref >40)
LDL Cholesterol: 63 mg/dL (ref 0–99)
Triglycerides: 89 mg/dL (ref <150)
VLDL: 18 mg/dL (ref 0–40)

## 2017-05-05 LAB — CBC
HEMATOCRIT: 33.3 % — AB (ref 39.0–52.0)
Hemoglobin: 11.7 g/dL — ABNORMAL LOW (ref 13.0–17.0)
MCH: 30.2 pg (ref 26.0–34.0)
MCHC: 35.1 g/dL (ref 30.0–36.0)
MCV: 85.8 fL (ref 78.0–100.0)
Platelets: 181 10*3/uL (ref 150–400)
RBC: 3.88 MIL/uL — ABNORMAL LOW (ref 4.22–5.81)
RDW: 13.9 % (ref 11.5–15.5)
WBC: 7.9 10*3/uL (ref 4.0–10.5)

## 2017-05-05 MED ORDER — SODIUM CHLORIDE 1 G PO TABS: 3.0000 g | ORAL_TABLET | Freq: Three times a day (TID) | ORAL | 1 refills | Status: DC

## 2017-05-05 NOTE — Evaluation (Signed)
Physical Therapy Evaluation Patient Details Name: [name] MRN: [id number] DOB: [birth year] Today's Date: 05/05/2017   History of Present Illness  Pt is a 71 y.o. male who presented to the ED with a few week history of confusion, anorexia, and ataxia per family. Pt additionally reports headache since previous admission and increase in pain over the past 1.5 months. PMH significant for essential hypertension, blind loop syndrome, type 2 DM, CAD, lumbar spondylosis with myelopathy, subdural hematoma, and iron deficiency anemia.   Clinical Impression  Pt is close to baseline functioning and should be safe at home with family supervision initially until pt is appropriate to live alone. There are no further acute PT needs.  Will sign off at this time.     Follow Up Recommendations No PT follow up;Other (comment) (intermittent supervision initially)    Equipment Recommendations  None recommended by PT    Recommendations for Other Services       Precautions / Restrictions Precautions Precautions: None Restrictions Weight Bearing Restrictions: No      Mobility  Bed Mobility Overal bed mobility: Needs Assistance Bed Mobility: Supine to Sit           General bed mobility comments: up in the recliner  Transfers Overall transfer level: Needs assistance Equipment used: None Transfers: Sit to/from Stand Sit to Stand: Supervision         General transfer comment: Supervision for safety.   Ambulation/Gait Ambulation/Gait assistance: Supervision Ambulation Distance (Feet): 500 Feet Assistive device: None Gait Pattern/deviations: Step-through pattern   Gait velocity interpretation: at or above normal speed for age/gender General Gait Details: generally steady.  See DGI.  pt unable to follow commands well enough to seamlessly move through DGI tasks.  No LOB in between/ within changes of balance tasks.  Stairs Stairs: Yes   Stair Management: One rail  Right;Alternating pattern;Forwards Number of Stairs: 12 General stair comments: safe with rail  Wheelchair Mobility    Modified Rankin (Stroke Patients Only)       Balance Overall balance assessment: Needs assistance Sitting-balance support: No upper extremity supported;Feet supported Sitting balance-Leahy Scale: Good     Standing balance support: No upper extremity supported Standing balance-Leahy Scale: Fair                   Standardized Balance Assessment Standardized Balance Assessment : Dynamic Gait Index   Dynamic Gait Index Level Surface: Normal Change in Gait Speed: Normal Gait with Horizontal Head Turns: Mild Impairment Gait with Vertical Head Turns: Normal Gait and Pivot Turn: Normal Step Over Obstacle: Mild Impairment (likely comes down to not understanding directions) Step Around Obstacles: Mild Impairment Steps: Mild Impairment Total Score: 20       Pertinent Vitals/Pain Pain Assessment: No/denies pain    Home Living Family/patient expects to be discharged to:: Private residence Living Arrangements: Alone Available Help at Discharge: Family;Available PRN/intermittently Type of Home: Apartment Home Access: Level entry;Ramped entrance     Home Layout: One level Home Equipment: Grab bars - toilet;Grab bars - tub/shower;Shower seat Additional Comments: Information from previous admission,  pt likely going to his son's home until all are comfortable with pt alone.    Prior Function Level of Independence: Independent         Comments: Daughter checks in on pt. He reports that he has been independent. Need to confirm with family.      Hand Dominance   Dominant Hand: Right    Extremity/Trunk Assessment   Upper Extremity Assessment Upper Extremity  Assessment: Defer to OT evaluation    Lower Extremity Assessment Lower Extremity Assessment: Overall WFL for tasks assessed       Communication   Communication: Prefers language other  than [place] (grand daughter utilized)  Associate Professor Arousal/Alertness: Awake/alert Behavior During Therapy: WFL for tasks assessed/performed Overall Cognitive Status: Difficult to assess                                 General Comments: Intermittent confusion with following commands. Specifically not able to follow DGI commands kept simple by grand daughter.      General Comments      Exercises     Assessment/Plan    PT Assessment Patent does not need any further PT services  PT Problem List         PT Treatment Interventions      PT Goals (Current goals can be found in the Care Plan section)  Acute Rehab PT Goals Patient Stated Goal: to feel better PT Goal Formulation: With patient    Frequency     Barriers to discharge        Co-evaluation               AM-PAC PT "6 Clicks" Daily Activity  Outcome Measure Difficulty turning over in bed (including adjusting bedclothes, sheets and blankets)?: A Little Difficulty moving from lying on back to sitting on the side of the bed? : A Little Difficulty sitting down on and standing up from a chair with arms (e.g., wheelchair, bedside commode, etc,.)?: None Help needed moving to and from a bed to chair (including a wheelchair)?: None Help needed walking in hospital room?: None Help needed climbing 3-5 steps with a railing? : None 6 Click Score: 22    End of Session   Activity Tolerance: Patient tolerated treatment well Patient left: in chair;with call bell/phone within reach;with family/visitor present Nurse Communication: Mobility status PT Visit Diagnosis: Difficulty in walking, not elsewhere classified (R26.2)    Time: 5638-9373 PT Time Calculation (min) (ACUTE ONLY): 15 min   Charges:   PT Evaluation $PT Eval Moderate Complexity: 1 Mod     PT G Codes:        Jun 04, 2017  Donnella Sham, PT [phone]  (pager)  Tessie Fass Banesa Tristan Jun 04, 2017, 2:18 PM

## 2017-05-05 NOTE — Progress Notes (Signed)
Discharge instructions given. Pt taken down via wheelchair with belongings.

## 2017-05-05 NOTE — Evaluation (Addendum)
Occupational Therapy Evaluation Patient Details Name: [name] MRN: [id number] DOB: 11-20-1945 Today's Date: 05/05/2017    History of Present Illness Pt is a 71 y.o. male who presented to the ED with a few week history of confusion, anorexia, and ataxia per family. Pt additionally reports headache since previous admission and increase in pain over the past 1.5 months. PMH significant for essential hypertension, blind loop syndrome, type 2 DM, CAD, lumbar spondylosis with myelopathy, subdural hematoma, and iron deficiency anemia.    Clinical Impression   PTA, pt was independent with ADL and functional mobility. He currently requires overall close supervision for safety with ADL participation and ADL transfers in hospital setting. Pt presents with intermittent difficulty following commands via interpreter this session. He would benefit from continued OT services while admitted to maximize independence and safety with ADL and functional mobility prior to returning home. OT to continue cognitive assessment and IADL participation next session.     Follow Up Recommendations  No OT follow up;Supervision/Assistance - 24 hour    Equipment Recommendations  None recommended by OT    Recommendations for Other Services       Precautions / Restrictions Restrictions Weight Bearing Restrictions: No      Mobility Bed Mobility Overal bed mobility: Needs Assistance Bed Mobility: Supine to Sit     Supine to sit: Supervision     General bed mobility comments: Supervision for safety.   Transfers Overall transfer level: Needs assistance Equipment used: None Transfers: Sit to/from Stand Sit to Stand: Supervision         General transfer comment: Supervision for safety.     Balance Overall balance assessment: Needs assistance Sitting-balance support: No upper extremity supported;Feet supported Sitting balance-Leahy Scale: Good     Standing balance support: No upper extremity  supported;During functional activity;Single extremity supported Standing balance-Leahy Scale: Fair                             ADL either performed or assessed with clinical judgement   ADL Overall ADL's : Needs assistance/impaired                                       General ADL Comments: Overall supervision for safety with ADL tasks this session. Pt with generalized weakness and decreased activity tolerance for ADL.      Vision Patient Visual Report: No change from baseline Vision Assessment?: No apparent visual deficits     Perception     Praxis Praxis Praxis tested?: Within functional limits    Pertinent Vitals/Pain Pain Assessment: No/denies pain     Hand Dominance     Extremity/Trunk Assessment Upper Extremity Assessment Upper Extremity Assessment: Overall WFL for tasks assessed   Lower Extremity Assessment Lower Extremity Assessment: Generalized weakness       Communication Communication Communication: Prefers language other than English;Interpreter utilized Orthoptist; # [id number])   Cognition Arousal/Alertness: Awake/alert Behavior During Therapy: WFL for tasks assessed/performed Overall Cognitive Status: Difficult to assess                                 General Comments: Intermittent confusion with following commands. For example, when asked to walk to the sink (via interpreter), pt walking toward toilet/shower area. Unsure if this is due to communication or cognition.  General Comments       Exercises     Shoulder Instructions      Home Living Family/patient expects to be discharged to:: Private residence Living Arrangements: Alone Available Help at Discharge: Family;Available PRN/intermittently Type of Home: Apartment Home Access: Level entry;Ramped entrance     Home Layout: One level     Bathroom Shower/Tub: Occupational psychologist: Standard     Home Equipment: Grab bars - toilet;Grab  bars - tub/shower;Shower seat   Additional Comments: Information from previous admission      Prior Functioning/Environment          Comments: Daughter checks in on pt. He reports that he has been independent. Need to confirm with family.         OT Problem List: Decreased strength;Decreased range of motion;Decreased activity tolerance;Impaired balance (sitting and/or standing);Decreased safety awareness;Decreased knowledge of use of DME or AE;Decreased knowledge of precautions;Pain;Decreased cognition      OT Treatment/Interventions: Self-care/ADL training;Therapeutic exercise;Therapeutic activities;Patient/family education;Balance training    OT Goals(Current goals can be found in the care plan section) Acute Rehab OT Goals Patient Stated Goal: to feel better OT Goal Formulation: With patient Time For Goal Achievement: 05/12/17 Potential to Achieve Goals: Good ADL Goals Pt Will Perform Grooming: Independently;standing Pt Will Perform Upper Body Dressing: Independently;sitting Pt Will Perform Lower Body Dressing: Independently;sit to/from stand Pt Will Transfer to Toilet: Independently;ambulating;regular height toilet Pt Will Perform Toileting - Clothing Manipulation and hygiene: Independently;sit to/from stand Pt Will Perform Tub/Shower Transfer: Independently;ambulating;Shower transfer  OT Frequency: Min 2X/week   Barriers to D/C:            Co-evaluation              AM-PAC PT "6 Clicks" Daily Activity     Outcome Measure Help from another person eating meals?: A Little Help from another person taking care of personal grooming?: A Little Help from another person toileting, which includes using toliet, bedpan, or urinal?: A Little Help from another person bathing (including washing, rinsing, drying)?: A Little Help from another person to put on and taking off regular upper body clothing?: A Little Help from another person to put on and taking off regular lower  body clothing?: A Little 6 Click Score: 18   End of Session Nurse Communication: Mobility status  Activity Tolerance: Patient tolerated treatment well Patient left: in chair;with call bell/phone within reach;with nursing/sitter in room  OT Visit Diagnosis: Muscle weakness (generalized) (M62.81);Pain Pain - Right/Left:  (abdominal) Pain - part of body:  (abdominal)                Time: 1308-6578 OT Time Calculation (min): 12 min Charges:  OT General Charges $OT Visit: 1 Visit OT Evaluation $OT Eval Moderate Complexity: 1 Mod G-Codes:     Norman Herrlich, MS OTR/L  Pager: [place] A Thu [place] 05/05/2017, 9:50 AM

## 2017-05-05 NOTE — Progress Notes (Signed)
   Subjective: Patient is feeling better today. Was seen sitting in his beside chair and stated that he is feeling better. He has no complaints. Denies nausea, vomiting, or dizziness.  Objective:  Vital signs in last 24 hours: Vitals:   05/04/17 1828 05/04/17 2102 05/05/17 0204 05/05/17 0522  BP: (!) 108/58 (!) 109/57 (!) 107/52 (!) 119/59  Pulse: 90 88 85 75  Resp: 18 18 18 18   Temp: 99.3 F (37.4 C) 98.8 F (37.1 C) 98.2 F (36.8 C) 97.6 F (36.4 C)  TempSrc: Oral Oral Oral Oral  SpO2: 97% 96% 98% 98%  Weight:      Height:       No data found.  Gen. Well-developed, frial looking elderly man, sitting comfortably in his chair, in no acute distress. Chest.clear bilaterally. CV. Regular rate and rhythm. Abdomen. Soft, non tender, bowel sounds positive. Extremities. No edema, no cyanosis, pulses intact and symmetrical.  Assessment/Plan:  71 y.o m presenting with altered mental status and hyponatremia.   Altered mental status,  Hyponatremia. 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 Patient has an history of subdural hemorrhage, which can result in cerebral salt wasting as ADH released in response to low volume, it is normally transient but can persist for longer time.thyroid or adrenal dysfunction was ruled out during previous hospitalization. His repeat TSH 05/04/17 was 0.6, remained within lower normal limit but decreased from previous check of TSH which was 1.435 months ago, subclinical hypothyroidism can be a possibility, free T4 1.23 (05/05/17). Patient rehydrated following fluid bolus. His orthostatic vitals were positive initially consistent with hypovolumic hyponatremia. Repeat orthostatics today were negative. Na 128 on evening of 9/15 and 130 this morning, which is his baseline. - Will continue to hold amitriptyline and venlafaxine on discharge.  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 The patient's reported longstanding history of headache has not responded to NSAIDS or tylenol. Most likely a sequelae of his subdural hematoma, hypovolemia can be playing a role. His headache was better when seen this morning. - Continue when necessary neck strain and metoclopramide.  Essential Hypertension. Blood pressure 119/59 today. - Continue home dose of lisinopril and metoprolol.  Type 2 Diabetes Mellitus. He has well-controlled diabetes. The patient's glu=109 on admission and 91 on recheck, his last HbA1c=5.9. Patient takes metformin 500mg  bid. - We will monitor his blood glucose and intervene if needed.  Dispo: Anticipated discharge in approximately 0-1 day(s).   Neva Seat, MD 05/05/2017, 9:03 AM Pager: [phone]

## 2017-05-05 NOTE — Care Management Obs Status (Signed)
[place] NOTIFICATION   Patient Details  Name: Kyrus Hyde MRN: [id number] Date of Birth: [birth year]   Medicare Observation Status Notification Given:  Yes    CrutchfieldAntony Haste, RN 05/05/2017, 2:50 PM

## 2017-05-05 NOTE — Progress Notes (Signed)
Internal Medicine Attending:   I saw and examined the patient. I reviewed the resident's note and I agree with the resident's findings and plan as documented in the resident's note. Spoke to patient via translator phone.  Patient reports feeling much better, HA resolved, feels like his strength is coming back. On exam: no skin tenting, MMM, heart RRR. Lungs CTAB  A/P Hypovolemic Hyponatremia - NA has improve with IVF, this clearly is not SIADH, I do not think his previous hospitalizations were SIADH either in hindsight, will remove SIADH from his problem list to hopefully prevent future confusion.  Urine studines c/w Hypovolemic (even after NS administration) they are not c/w cerebral salt wasting.  TSH was wnl, T4 is  Very mildly elevated, this is not a cause of hyponatremia and likely just reflects a stress reaction. -Given all of his symtoms I would also favor d/c of amitryptile and venlafaxine until he is seen in the clinic for follow up. -D/C home today

## 2017-05-05 NOTE — Care Management CC44 (Signed)
Condition Code 44 Documentation Completed  Patient Details  Name: [name] MRN: [id number] Date of Birth: Oct 17, 1945   Condition Code 44 given:  Yes Patient signature on Condition Code 44 notice:  Yes Documentation of 2 MD's agreement:  Yes Code 44 added to claim:  Yes    Swayze Kozuch, Antony Haste, RN 05/05/2017, 2:50 PM

## 2017-05-06 NOTE — Progress Notes (Signed)
Late Note Addendum to the Initial Evaluation    05/05/17 1358  PT Time Calculation  PT Start Time (ACUTE ONLY) 1233  PT Stop Time (ACUTE ONLY) 1248  PT Time Calculation (min) (ACUTE ONLY) 15 min  PT G-Codes **NOT FOR INPATIENT CLASS**  Functional Assessment Tool Used AM-PAC 6 Clicks Basic Mobility;Clinical judgement  Functional Limitation Mobility: Walking and moving around  Mobility: Walking and Moving Around Current Status (Z7915) CH  Mobility: Walking and Moving Around Goal Status (A5697) CH  Mobility: Walking and Moving Around Discharge Status (X4801) [facility]  PT General Charges  $$ ACUTE PT VISIT 1 Visit  PT Evaluation  $PT Eval Moderate Complexity 1 Mod   05/06/2017  Donnella Sham, PT [phone]  (pager)

## 2017-05-06 NOTE — Discharge Summary (Signed)
Name: [name] MRN: [id number] DOB: April 06, 1946 71 y.o. PCP: Ina Homes, MD  Date of Admission: 05/03/2017 11:01 PM Date of Discharge: 05/06/2017 Attending Physician: No att. providers found  Discharge Diagnosis:  1. Altered Mental Status 2. Hyponatremia  Discharge Medications: Allergies as of 05/05/2017      Reactions   Hctz [hydrochlorothiazide] Other (See Comments)   HCTZ appears to have caused severe hyponatremia that lead to multiple admissions (was thought to be SIADH but at that time patient was thought to be off HCTZ and was still taking)      Medication List    STOP taking these medications   amitriptyline 10 MG tablet Commonly known as:  ELAVIL   amitriptyline 25 MG tablet Commonly known as:  ELAVIL   venlafaxine XR 37.5 MG 24 hr capsule Commonly known as:  EFFEXOR-XR     TAKE these medications   aspirin 81 MG chewable tablet Chew 81 mg by mouth daily.   atorvastatin 80 MG tablet Commonly known as:  LIPITOR Take 1 tablet (80 mg total) by mouth daily.   ferrous sulfate 325 (65 FE) MG EC tablet Take 1 tablet (325 mg total) by mouth 2 (two) times daily.   ibuprofen 600 MG tablet Commonly known as:  ADVIL,MOTRIN Take 1 tablet (600 mg total) by mouth every 6 (six) hours as needed. What changed:  reasons to take this   lisinopril 20 MG tablet Commonly known as:  PRINIVIL,ZESTRIL Take 1 tablet (20 mg total) by mouth daily.   metFORMIN 500 MG tablet Commonly known as:  GLUCOPHAGE Take 1 tablet (500 mg total) by mouth 2 (two) times daily with a meal.   metoprolol succinate 50 MG 24 hr tablet Commonly known as:  TOPROL-XL Take 1 tablet (50 mg total) by mouth daily.   nitroGLYCERIN 0.4 MG SL tablet Commonly known as:  NITROSTAT Place 1 tablet (0.4 mg total) under the tongue daily. What changed:  when to take this  reasons to take this   omeprazole 20 MG capsule Commonly known as:  PRILOSEC Take 1 capsule (20 mg total) by mouth  daily.   pregabalin 25 MG capsule Commonly known as:  LYRICA Take 1 capsule (25 mg total) by mouth 3 (three) times daily.   sennosides-docusate sodium 8.6-50 MG tablet Commonly known as:  SENOKOT-S Take 2 tablets by mouth daily. What changed:  when to take this  reasons to take this   sodium chloride 1 g tablet Take 3 tablets (3 g total) by mouth 3 (three) times daily with meals.   tamsulosin 0.4 MG Caps capsule Commonly known as:  FLOMAX Take 1 capsule (0.4 mg total) by mouth daily after breakfast.            Discharge Care Instructions        Start     Ordered   05/05/17 0000  sodium chloride 1 g tablet  3 times daily with meals     05/05/17 1419   05/05/17 0000  Increase activity slowly     05/05/17 1419   05/05/17 0000  Call MD for:  persistant nausea and vomiting     05/05/17 1419   05/05/17 0000  Call MD for:  difficulty breathing, headache or visual disturbances     05/05/17 1419   05/05/17 0000  Call MD for:  persistant dizziness or light-headedness     05/05/17 1419   05/05/17 0000  Discharge instructions    Comments:  Thank you for allowing [place] to  care for you.  Please schedule a follow up appointment with the Internal Medicine Center in the next 1-2 weeks.   Some of your medications have changed: - Stop taking Amitriptyline - Stop taking Venlafaxine  - Start taking Salt tablets - 3 tablets, three times a day (with meals)  We believe your symptoms were due to low sodium that was caused by not not getting enough salt and due to low blood pressure from not drinking enough fluids. - Please take the prescribed salt tablets and drink plenty of fluids   05/05/17 1419      Disposition and follow-up:   [name] was discharged from [facility] in Good condition.  At the hospital follow up visit please address:  1.   Altered mental status secondary to Hyponatremia: Please obtain follow up labs to monitor the patient's sodium.  Please ensure patient is no longer volume restricting.  2.  Labs / imaging needed at time of follow-up: BMP  3.  Pending labs/ test needing follow-up: None  Follow-up Appointments:   - Follow up with PCP in 1-2 weeks.  Hospital Course by problem list:  1. Altered mental status secondary to hyponatremia. Patient presented to the ED after experiencing increased generalized weakness and ataxia, decreased appetite, and increased confusion and a Na of 121.  His hyponatremia was first noted in March after Hydrochlorothiazide was added to his antihypertensive regimen in December. His HCTZ was supposedly discontinued, but it was discovered that he was still taking it months later. His initial episode of hyponatremia was suspected to be due to SIADH  and he was instructed to fluid restrict. Despite this, his sodium continued to trend down and it became apparent that his hyponatremia was more likely due to diuretic use than SIADH.  During this admission, family noted that he still believed that he needed to volume restrict and has not been eating or drinking much. Patient was treated for hypovolemic hyponatremia with IV fluids and clinically improved. His Na improved to 130 as well, which is his baseline. Amitriptyline and Venlafaxine were held and are to be discontinued at discharge. He was also discharged on 3 salt tabs, three times daily.  Discharge Vitals:   BP (!) 112/58 (BP Location: Left Arm)   Pulse 86   Temp 98.3 F (36.8 C) (Oral)   Resp 17   Ht 5\' 6"  (1.676 m)   Wt 149 lb 0.5 oz (67.6 kg)   SpO2 97%   BMI 24.05 kg/m   Pertinent Labs, Studies, and Procedures:  BMP Latest Ref Rng & Units 05/05/2017 05/04/2017 05/04/2017  Glucose 65 - 99 mg/dL 96 124(H) 161(H)  BUN 6 - 20 mg/dL 13 11 10   Creatinine 0.61 - 1.24 mg/dL 0.75 1.03 0.83  BUN/Creat Ratio 10 - 24 - - -  Sodium 135 - 145 mmol/L 130(L) 128(L) 127(L)  Potassium 3.5 - 5.1 mmol/L 4.1 4.0 3.2(L)  Chloride 101 - 111 mmol/L 103 98(L)  97(L)  CO2 22 - 32 mmol/L 21(L) 22 19(L)  Calcium 8.9 - 10.3 mg/dL 8.2(L) 8.1(L) 8.0(L)   CBC Latest Ref Rng & Units 05/05/2017 05/03/2017 05/03/2017  WBC 4.0 - 10.5 K/uL 7.9 - 15.1(H)  Hemoglobin 13.0 - 17.0 g/dL 11.7(L) 16.3 15.4  Hematocrit 39.0 - 52.0 % 33.3(L) 48.0 43.1  Platelets 150 - 400 K/uL 181 - 186     Discharge Instructions: Discharge Instructions    Call MD for:  difficulty breathing, headache or visual disturbances    Complete  by:  As directed    Call MD for:  persistant dizziness or light-headedness    Complete by:  As directed    Call MD for:  persistant nausea and vomiting    Complete by:  As directed    Discharge instructions    Complete by:  As directed    Thank you for allowing [place] to care for you.  Please schedule a follow up appointment with the Internal Medicine Center in the next 1-2 weeks.   Some of your medications have changed: - Stop taking Amitriptyline - Stop taking Venlafaxine  - Start taking Salt tablets - 3 tablets, three times a day (with meals)  We believe your symptoms were due to low sodium that was caused by not not getting enough salt and due to low blood pressure from not drinking enough fluids. - Please take the prescribed salt tablets and drink plenty of fluids   Increase activity slowly    Complete by:  As directed       Signed: Neva Seat, MD 05/06/2017, 10:14 PM   Pager: [phone]

## 2017-05-08 ENCOUNTER — Other Ambulatory Visit: Payer: Self-pay | Admitting: Internal Medicine

## 2017-05-16 NOTE — Progress Notes (Addendum)
Late entry for missed G-code for OT evaluation May 28, 2017.   05-28-2017 0900  OT G-codes **NOT FOR INPATIENT CLASS**  Functional Assessment Tool Used Clinical judgement  Functional Limitation Self care  Self Care Current Status (J9417) CI  Self Care Goal Status (E0814) CH   Norman Herrlich, MS OTR/L  Pager: [phone]

## 2017-05-17 ENCOUNTER — Encounter: Payer: Self-pay | Admitting: Internal Medicine

## 2017-05-20 ENCOUNTER — Telehealth: Payer: Self-pay | Admitting: Internal Medicine

## 2017-05-20 DIAGNOSIS — R2681 Unsteadiness on feet: Secondary | ICD-10-CM

## 2017-05-20 DIAGNOSIS — R296 Repeated falls: Secondary | ICD-10-CM

## 2017-05-20 DIAGNOSIS — R4182 Altered mental status, unspecified: Secondary | ICD-10-CM

## 2017-05-20 NOTE — Telephone Encounter (Signed)
[facility] REP Laura/TRACEY requesting

## 2017-05-21 NOTE — Telephone Encounter (Signed)
[facility] RN requesting Home Health Referral for  Skilled Nursing for pain management and Physical Therapy for unsteady gait/frequent falls.  Pt also needing a Patent attorney during service.  Please advise.

## 2017-05-30 NOTE — Telephone Encounter (Signed)
I apologize for the late response. I think both of these referrals would be appropriate.

## 2017-06-19 ENCOUNTER — Encounter: Payer: Self-pay | Admitting: Internal Medicine

## 2017-06-19 ENCOUNTER — Ambulatory Visit (INDEPENDENT_AMBULATORY_CARE_PROVIDER_SITE_OTHER): Payer: Medicare Other | Admitting: Internal Medicine

## 2017-06-19 VITALS — BP 142/75 | HR 74 | Temp 97.8°F | Ht 69.0 in | Wt 160.1 lb

## 2017-06-19 DIAGNOSIS — Z5189 Encounter for other specified aftercare: Secondary | ICD-10-CM

## 2017-06-19 DIAGNOSIS — F0781 Postconcussional syndrome: Secondary | ICD-10-CM | POA: Diagnosis not present

## 2017-06-19 DIAGNOSIS — E222 Syndrome of inappropriate secretion of antidiuretic hormone: Secondary | ICD-10-CM | POA: Diagnosis not present

## 2017-06-19 DIAGNOSIS — G44309 Post-traumatic headache, unspecified, not intractable: Secondary | ICD-10-CM

## 2017-06-19 DIAGNOSIS — E119 Type 2 diabetes mellitus without complications: Secondary | ICD-10-CM | POA: Diagnosis not present

## 2017-06-19 DIAGNOSIS — H53149 Visual discomfort, unspecified: Secondary | ICD-10-CM

## 2017-06-19 DIAGNOSIS — R296 Repeated falls: Secondary | ICD-10-CM

## 2017-06-19 DIAGNOSIS — E871 Hypo-osmolality and hyponatremia: Secondary | ICD-10-CM

## 2017-06-19 LAB — POCT GLYCOSYLATED HEMOGLOBIN (HGB A1C): Hemoglobin A1C: 6.2

## 2017-06-19 LAB — GLUCOSE, CAPILLARY: Glucose-Capillary: 118 mg/dL — ABNORMAL HIGH (ref 65–99)
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
## 2017-06-19 NOTE — Patient Instructions (Signed)
It was a pleasure to see you today.   - We have ordered a walker with a seat for you.   - Please follow-up with neurology   - Today we will check your salt and I will call you if there is anything abnormal  - Please come back in 3 months for a check-up
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
## 2017-06-20 LAB — BMP8+ANION GAP
Anion Gap: 15 mmol/L (ref 10.0–18.0)
BUN/Creatinine Ratio: 11 (ref 10–24)
BUN: 11 mg/dL (ref 8–27)
CALCIUM: 9 mg/dL (ref 8.6–10.2)
CHLORIDE: 101 mmol/L (ref 96–106)
CO2: 23 mmol/L (ref 20–29)
Creatinine, Ser: 0.97 mg/dL (ref 0.76–1.27)
GFR calc non Af Amer: 78 mL/min/{1.73_m2} (ref >59)
GFR, EST AFRICAN AMERICAN: 90 mL/min/{1.73_m2} (ref >59)
GLUCOSE: 119 mg/dL — AB (ref 65–99)
Potassium: 4.6 mmol/L (ref 3.5–5.2)
Sodium: 139 mmol/L (ref 134–144)

## 2017-06-21 NOTE — Progress Notes (Signed)
Internal Medicine Clinic Attending  I saw and evaluated the patient.  I personally confirmed the key portions of the history and exam documented by Dr. Tarri Abernethy and I reviewed pertinent patient test results.  The assessment, diagnosis, and plan were formulated together and I agree with the documentation in the resident's note.  Oda Kilts, MD

## 2017-06-25 NOTE — Addendum Note (Signed)
Addended by: Hulan Fray on: 06/25/2017 05:39 PM   Modules accepted: Orders

## 2017-06-27 ENCOUNTER — Encounter: Payer: Self-pay | Admitting: General Surgery

## 2017-07-04 ENCOUNTER — Telehealth: Payer: Self-pay | Admitting: *Deleted

## 2017-07-04 NOTE — Telephone Encounter (Signed)
Received refill request from [facility] (for atorvastatin, tamsulosin, metop succinate) [facility] not listed as his pharmacy. LM to patient's son [phone]) to call us back to verify.

## 2017-07-05 ENCOUNTER — Telehealth: Payer: Self-pay

## 2017-07-05 NOTE — Telephone Encounter (Signed)
Nicolette with [facility] requesting VO. Please call pt back.

## 2017-07-05 NOTE — Telephone Encounter (Signed)
VO for [facility] PT for 2x week for 4 weeks for safety, balance, endurance, strength, gait training, do you agree?

## 2017-07-08 ENCOUNTER — Other Ambulatory Visit: Payer: Self-pay | Admitting: *Deleted

## 2017-07-08 DIAGNOSIS — N401 Enlarged prostate with lower urinary tract symptoms: Secondary | ICD-10-CM

## 2017-07-08 DIAGNOSIS — I10 Essential (primary) hypertension: Secondary | ICD-10-CM

## 2017-07-08 DIAGNOSIS — I209 Angina pectoris, unspecified: Secondary | ICD-10-CM

## 2017-07-08 DIAGNOSIS — E119 Type 2 diabetes mellitus without complications: Secondary | ICD-10-CM

## 2017-07-08 DIAGNOSIS — K219 Gastro-esophageal reflux disease without esophagitis: Secondary | ICD-10-CM

## 2017-07-08 DIAGNOSIS — M48062 Spinal stenosis, lumbar region with neurogenic claudication: Secondary | ICD-10-CM

## 2017-07-09 MED ORDER — TAMSULOSIN HCL 0.4 MG PO CAPS: 0.4000 mg | ORAL_CAPSULE | Freq: Every day | ORAL | 3 refills | Status: DC

## 2017-07-09 MED ORDER — PREGABALIN 25 MG PO CAPS: 25.0000 mg | ORAL_CAPSULE | Freq: Three times a day (TID) | ORAL | 0 refills | Status: DC

## 2017-07-09 MED ORDER — OMEPRAZOLE 20 MG PO CPDR: 20.0000 mg | DELAYED_RELEASE_CAPSULE | Freq: Every day | ORAL | 0 refills | Status: DC

## 2017-07-09 MED ORDER — ATORVASTATIN CALCIUM 80 MG PO TABS: 80.0000 mg | ORAL_TABLET | Freq: Every day | ORAL | 0 refills | Status: DC

## 2017-07-09 MED ORDER — METFORMIN HCL 500 MG PO TABS: 500.0000 mg | ORAL_TABLET | Freq: Two times a day (BID) | ORAL | 0 refills | Status: DC

## 2017-07-09 MED ORDER — METOPROLOL SUCCINATE ER 50 MG PO TB24: 50.0000 mg | ORAL_TABLET | Freq: Every day | ORAL | 0 refills | Status: DC

## 2017-07-09 MED ORDER — LISINOPRIL 20 MG PO TABS: 20.0000 mg | ORAL_TABLET | Freq: Every day | ORAL | 0 refills | Status: DC

## 2017-07-09 NOTE — Telephone Encounter (Signed)
[facility] is still waiting for the office to reply back on pt Rx. Please call back, the reference # [id number]. Would like a respond back by today.

## 2017-07-09 NOTE — Telephone Encounter (Signed)
I agree

## 2017-07-10 NOTE — Telephone Encounter (Signed)
Spoke to [facility] pharmacist, Lyrica called in. Verified all meds that were sent 07/09/17 have been received.

## 2017-07-13 ENCOUNTER — Encounter: Payer: Self-pay | Admitting: *Deleted

## 2017-07-15 ENCOUNTER — Telehealth: Payer: Self-pay

## 2017-07-15 NOTE — Telephone Encounter (Signed)
[name] with [facility] want to informed the office, pt had a missed visit with PT on 07/15/2017. Will go back to see pt for PT on 07/18/2017.

## 2017-07-23 ENCOUNTER — Encounter: Payer: Self-pay | Admitting: Internal Medicine

## 2017-07-23 ENCOUNTER — Ambulatory Visit: Payer: Medicare Other | Admitting: Internal Medicine

## 2017-07-23 ENCOUNTER — Encounter (INDEPENDENT_AMBULATORY_CARE_PROVIDER_SITE_OTHER): Payer: Self-pay

## 2017-07-23 VITALS — BP 175/89 | HR 77 | Temp 98.2°F | Wt 162.9 lb

## 2017-07-23 DIAGNOSIS — F0781 Postconcussional syndrome: Secondary | ICD-10-CM

## 2017-07-23 NOTE — Progress Notes (Signed)
Internal Medicine Clinic Attending  Case discussed with Dr. Melvin  at the time of the visit.  We reviewed the resident's history and exam and pertinent patient test results.  I agree with the assessment, diagnosis, and plan of care documented in the resident's note.  

## 2017-07-23 NOTE — Addendum Note (Signed)
Addended by: Larey Dresser A on: 07/23/2017 03:51 PM   Modules accepted: Level of Service
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
## 2017-07-23 NOTE — Assessment & Plan Note (Addendum)
Patient has an extensive medical history related to his post concussive syndrome as previously docommented. He is here today to arrange an appointment with neurology as he has missed his recent appointments. He was schedule to see a neurologist previously but cancelled due to not feeling well. He was again scheduled to see them on 11/30, but his caregiver who managed his appointments had a family emergency and he was unable to remember or get himself to that appointment. He now has a granddaughter who has moved in near by who is helping him with his appointments.   - Appointment has been arranged for patient and appointment information was given to patient on AVS with instruction to ensure his granddaughter sees the information:  08/01/17  @  9:30 am Dr. Everette Rank Neurology at [address] [place], [place] Phone: [phone]

## 2017-07-23 NOTE — Patient Instructions (Addendum)
Thank you for allowing [place] to care for you.  For your post concussive syndrome (memory problems): - We have arranged an appointment for you with a neurologist  Appointment date and time: 08/01/17  @  9:30 am  Dr. Everette Rank  Neurology at [address] [place], [place]  Phone: [phone]

## 2017-08-27 ENCOUNTER — Other Ambulatory Visit: Payer: Self-pay | Admitting: Internal Medicine

## 2017-08-27 DIAGNOSIS — E119 Type 2 diabetes mellitus without complications: Secondary | ICD-10-CM

## 2017-08-27 DIAGNOSIS — I10 Essential (primary) hypertension: Secondary | ICD-10-CM

## 2017-08-27 DIAGNOSIS — I209 Angina pectoris, unspecified: Secondary | ICD-10-CM

## 2017-08-27 DIAGNOSIS — K219 Gastro-esophageal reflux disease without esophagitis: Secondary | ICD-10-CM

## 2017-09-12 ENCOUNTER — Other Ambulatory Visit: Payer: Self-pay

## 2017-09-12 ENCOUNTER — Encounter (HOSPITAL_COMMUNITY): Payer: Self-pay | Admitting: Emergency Medicine

## 2017-09-12 ENCOUNTER — Encounter: Payer: Medicare Other | Admitting: Internal Medicine

## 2017-09-12 DIAGNOSIS — R531 Weakness: Secondary | ICD-10-CM | POA: Diagnosis not present

## 2017-09-12 DIAGNOSIS — Z5321 Procedure and treatment not carried out due to patient leaving prior to being seen by health care provider: Secondary | ICD-10-CM | POA: Insufficient documentation

## 2017-09-12 LAB — CBC
HEMATOCRIT: 41.9 % (ref 39.0–52.0)
Hemoglobin: 15.2 g/dL (ref 13.0–17.0)
MCH: 31.4 pg (ref 26.0–34.0)
MCHC: 36.3 g/dL — ABNORMAL HIGH (ref 30.0–36.0)
MCV: 86.6 fL (ref 78.0–100.0)
Platelets: 219 10*3/uL (ref 150–400)
RBC: 4.84 MIL/uL (ref 4.22–5.81)
RDW: 12.5 % (ref 11.5–15.5)
WBC: 7.5 10*3/uL (ref 4.0–10.5)

## 2017-09-12 LAB — BASIC METABOLIC PANEL
ANION GAP: 12 (ref 5–15)
BUN: 11 mg/dL (ref 6–20)
CHLORIDE: 90 mmol/L — AB (ref 101–111)
CO2: 24 mmol/L (ref 22–32)
Calcium: 9.4 mg/dL (ref 8.9–10.3)
Creatinine, Ser: 0.83 mg/dL (ref 0.61–1.24)
GFR calc non Af Amer: >60 mL/min (ref >60)
Glucose, Bld: 119 mg/dL — ABNORMAL HIGH (ref 65–99)
POTASSIUM: 4.8 mmol/L (ref 3.5–5.1)
SODIUM: 126 mmol/L — AB (ref 135–145)

## 2017-09-12 LAB — CBG MONITORING, ED: Glucose-Capillary: 116 mg/dL — ABNORMAL HIGH (ref 65–99)

## 2017-09-12 NOTE — ED Triage Notes (Signed)
Pt's son reports his father has felt "weak and bad" for two days.  Pt reports he has not wanted to eat in 3-4 days.  [place].  Pt had "a bad fall 6 months ago" and has not had good long term memory.  Negative stroke screen.

## 2017-09-13 ENCOUNTER — Emergency Department (HOSPITAL_COMMUNITY)
Admission: EM | Admit: 2017-09-13 | Discharge: 2017-09-13 | Disposition: A | Discharge status: Home / Self Care | Payer: Medicare Other | Attending: Emergency Medicine | Admitting: Emergency Medicine

## 2017-09-13 NOTE — ED Notes (Signed)
Family updated on wait time. Informed family member that pt should try to wait a little longer to be evaluated due to labwork. Family trying to convince pt to stay

## 2017-09-13 NOTE — ED Notes (Signed)
Unable to locate pt and family to take back to exam room

## 2017-09-13 NOTE — ED Notes (Signed)
Pt seen leaving the ED by registration

## 2017-09-13 NOTE — ED Notes (Signed)
09/13/2017, 16:13, spoke with pt.s sons( per chart) and did a follow-up call from last night.   Pt.s son verbalized that he  will have pt. Reevaluated by his PCP or bringing him back to ED.

## 2017-09-19 ENCOUNTER — Encounter: Payer: Medicare Other | Admitting: Internal Medicine

## 2017-10-09 ENCOUNTER — Telehealth: Payer: Self-pay | Admitting: Internal Medicine

## 2017-10-09 NOTE — Telephone Encounter (Signed)
Spoke w/ csw 2/20, pt per visit notes does not need to check cbg but he worries about it and has been walking to a friend/ neighbor's house daily to check his bs with their meter. Will send request to pcp. Dr Maricela Bo and donnap addressed this issue.
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
## 2017-10-14 ENCOUNTER — Encounter: Payer: Self-pay | Admitting: Internal Medicine

## 2017-10-14 ENCOUNTER — Other Ambulatory Visit (HOSPITAL_COMMUNITY): Payer: Medicare Other

## 2017-10-14 ENCOUNTER — Ambulatory Visit (INDEPENDENT_AMBULATORY_CARE_PROVIDER_SITE_OTHER): Payer: Medicare Other | Admitting: Internal Medicine

## 2017-10-14 ENCOUNTER — Other Ambulatory Visit: Payer: Self-pay

## 2017-10-14 ENCOUNTER — Ambulatory Visit (HOSPITAL_COMMUNITY)
Admission: RE | Admit: 2017-10-14 | Discharge: 2017-10-14 | Disposition: A | Discharge status: Home / Self Care | Payer: Medicare Other | Source: Ambulatory Visit | Attending: Internal Medicine | Admitting: Internal Medicine

## 2017-10-14 VITALS — Wt 164.6 lb

## 2017-10-14 DIAGNOSIS — Z79899 Other long term (current) drug therapy: Secondary | ICD-10-CM | POA: Diagnosis not present

## 2017-10-14 DIAGNOSIS — I209 Angina pectoris, unspecified: Secondary | ICD-10-CM

## 2017-10-14 DIAGNOSIS — S065XAA Traumatic subdural hemorrhage with loss of consciousness status unknown, initial encounter: Secondary | ICD-10-CM

## 2017-10-14 DIAGNOSIS — Z87820 Personal history of traumatic brain injury: Secondary | ICD-10-CM

## 2017-10-14 DIAGNOSIS — E119 Type 2 diabetes mellitus without complications: Secondary | ICD-10-CM

## 2017-10-14 DIAGNOSIS — Z7984 Long term (current) use of oral hypoglycemic drugs: Secondary | ICD-10-CM

## 2017-10-14 DIAGNOSIS — K5730 Diverticulosis of large intestine without perforation or abscess without bleeding: Secondary | ICD-10-CM

## 2017-10-14 DIAGNOSIS — E871 Hypo-osmolality and hyponatremia: Secondary | ICD-10-CM | POA: Diagnosis not present

## 2017-10-14 DIAGNOSIS — I25119 Atherosclerotic heart disease of native coronary artery with unspecified angina pectoris: Secondary | ICD-10-CM

## 2017-10-14 DIAGNOSIS — I10 Essential (primary) hypertension: Secondary | ICD-10-CM

## 2017-10-14 DIAGNOSIS — S065X9A Traumatic subdural hemorrhage with loss of consciousness of unspecified duration, initial encounter: Secondary | ICD-10-CM

## 2017-10-14 MED ORDER — LISINOPRIL 40 MG PO TABS
40.0000 mg | ORAL_TABLET | Freq: Every day | ORAL | 1 refills | Status: DC
Start: 2017-10-14 — End: 2017-11-20

## 2017-10-14 MED ORDER — METFORMIN HCL 500 MG PO TABS: 500.0000 mg | ORAL_TABLET | Freq: Two times a day (BID) | ORAL | 0 refills | Status: DC

## 2017-10-14 MED ORDER — NITROGLYCERIN 0.4 MG SL SUBL: 0.4000 mg | SUBLINGUAL_TABLET | Freq: Every day | SUBLINGUAL | 2 refills | Status: DC

## 2017-10-14 NOTE — Assessment & Plan Note (Signed)
The patient was given refill of Glucophage 500 mg twice daily and was given prescription for new blood glucose machine.

## 2017-10-14 NOTE — Progress Notes (Signed)
Internal Medicine Clinic Attending  Case discussed with Dr. Maricela Bo at the time of the visit.  We reviewed the resident's history and exam and pertinent patient test results.  I agree with the assessment, diagnosis, and plan of care documented in the resident's note.  I reviewed last Cardiology note and it appeared the symptoms he is describing were present last March.  EKG reviewed with resident.  I think follow up with Cardiology is appropriate.  If he cannot follow in a reasonable time frame, would recommend getting a stress test here in [place].

## 2017-10-14 NOTE — Patient Instructions (Addendum)
It was a pleasure to see you today Mr. Prosperi. Please make the following changes:  -Please start taking lisinopril 40mg  daily rather than lisinopril 20mg  daily -Please follow up with cardiologist Dr. Philbert Riser at [facility] -Please follow up with neurologist at [facility] on Monday June 17th 9am  -Follow up with neuropsychiatric testing  If you have any questions or concerns, please call our clinic at [phone] between 9am-5pm and after hours call [phone] and ask for the internal medicine resident on call. If you feel you are having a medical emergency please call 911.   Thank you, we look forward to help you remain healthy!  Lars Mage, MD Internal Medicine PGY1  FOLLOW-UP INSTRUCTIONS When: 2 months For: follow up on chest pain and blood pressure What to bring: medications

## 2017-10-14 NOTE — Assessment & Plan Note (Signed)
The patient states that he has been noting chest pain that occurs 2-3 times per week.  The patient notes the chest pain to be 4 out of 10 intensity, present over the anterior chest, nonradiating, non-positional, take, stabbing in nature.  The patient states that the chest pain lasts 3-4 hours.  At times he has used nitroglycerin to help alleviate the chest pain.  He states that the chest pain mostly occurs at nighttime.  Last time nitroglycerin was used was 15 days ago and he is currently out of prescription.  Patient is an shortness of breath.  Assessment The patient has had anginal symptoms noted per documentation on several occasions now. He had cardiac cath done at [facility] on 09/04/2016 which showed nonobstructive coronary artery disease.  The patient was last evaluated by cardiology at [facility] in March 2018 and the chest pain was thought to be more GI etiology and therefore the patient was told to return to clinic in 1 year.  Plan Due to the patient's severe risk factors and recent chest pain EKG was done which showed normal sinus rhythm without any ST or T wave abnormalities.   -The patient follow-up with [facility] for cardiology
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
## 2017-10-14 NOTE — Assessment & Plan Note (Signed)
The patient's blood pressure during this visit was 148/75 and repeat measurement was .  The patient is currently taking lisinopril 20 mg daily, metoprolol 50 mg daily. The patient's home blood pressure readings have ranged 130-160/80-110. Since the patient is not at blood pressure goal, will increase lisinopril.   Plan Increased lisinopril from 20mg  qd to 40mg  qd

## 2017-10-14 NOTE — Assessment & Plan Note (Signed)
The patient fell in May 2018 and developed a subdural hematoma. He was last seen by [facility] on 08/01/17 during which time he was recommended to get neuropsych testing and return to clinic in 4 months.  The patient has not followed up with clinic or gotten neuropsych testing since then.  Plan -Called Dr. McDermott's office to get apppointment for neuropsych testing  -Scheduled an appointment with [facility]) June 17th 9am

## 2017-10-16 ENCOUNTER — Telehealth: Payer: Self-pay | Admitting: Dietician

## 2017-10-16 DIAGNOSIS — E119 Type 2 diabetes mellitus without complications: Secondary | ICD-10-CM

## 2017-10-16 MED ORDER — GLUCOSE BLOOD VI STRP: ORAL_STRIP | 5 refills | Status: DC

## 2017-10-16 MED ORDER — ONETOUCH DELICA LANCETS FINE MISC: 5 refills | Status: DC

## 2017-10-16 MED ORDER — ONETOUCH VERIO W/DEVICE KIT: 1.0000 | PACK | Freq: Every day | 1 refills | Status: DC

## 2017-10-16 NOTE — Telephone Encounter (Signed)
Request per Dr. Maricela Bo for new glucometer

## 2017-11-20 ENCOUNTER — Other Ambulatory Visit: Payer: Self-pay | Admitting: *Deleted

## 2017-11-20 DIAGNOSIS — I209 Angina pectoris, unspecified: Secondary | ICD-10-CM

## 2017-11-20 DIAGNOSIS — I10 Essential (primary) hypertension: Secondary | ICD-10-CM

## 2017-11-21 MED ORDER — LISINOPRIL 40 MG PO TABS: 40.0000 mg | ORAL_TABLET | Freq: Every day | ORAL | 1 refills | Status: DC

## 2017-12-16 ENCOUNTER — Encounter: Payer: Self-pay | Admitting: Internal Medicine

## 2017-12-16 ENCOUNTER — Ambulatory Visit (HOSPITAL_COMMUNITY)
Admission: RE | Admit: 2017-12-16 | Discharge: 2017-12-16 | Disposition: A | Discharge status: Home / Self Care | Payer: Medicare Other | Source: Ambulatory Visit | Attending: Internal Medicine | Admitting: Internal Medicine

## 2017-12-16 ENCOUNTER — Ambulatory Visit (INDEPENDENT_AMBULATORY_CARE_PROVIDER_SITE_OTHER): Payer: Medicare Other | Admitting: Internal Medicine

## 2017-12-16 VITALS — BP 118/74 | HR 102 | Temp 98.4°F | Wt 165.1 lb

## 2017-12-16 DIAGNOSIS — I10 Essential (primary) hypertension: Secondary | ICD-10-CM

## 2017-12-16 DIAGNOSIS — K319 Disease of stomach and duodenum, unspecified: Secondary | ICD-10-CM

## 2017-12-16 DIAGNOSIS — R05 Cough: Secondary | ICD-10-CM

## 2017-12-16 DIAGNOSIS — I700 Atherosclerosis of aorta: Secondary | ICD-10-CM | POA: Diagnosis not present

## 2017-12-16 DIAGNOSIS — J40 Bronchitis, not specified as acute or chronic: Secondary | ICD-10-CM | POA: Insufficient documentation

## 2017-12-16 DIAGNOSIS — F419 Anxiety disorder, unspecified: Secondary | ICD-10-CM | POA: Diagnosis not present

## 2017-12-16 DIAGNOSIS — K921 Melena: Secondary | ICD-10-CM

## 2017-12-16 DIAGNOSIS — Z87891 Personal history of nicotine dependence: Secondary | ICD-10-CM

## 2017-12-16 DIAGNOSIS — K5900 Constipation, unspecified: Secondary | ICD-10-CM

## 2017-12-16 DIAGNOSIS — R1013 Epigastric pain: Secondary | ICD-10-CM

## 2017-12-16 DIAGNOSIS — R059 Cough, unspecified: Secondary | ICD-10-CM

## 2017-12-16 DIAGNOSIS — J42 Unspecified chronic bronchitis: Secondary | ICD-10-CM

## 2017-12-16 DIAGNOSIS — Z903 Acquired absence of stomach [part of]: Secondary | ICD-10-CM

## 2017-12-16 DIAGNOSIS — Z8719 Personal history of other diseases of the digestive system: Secondary | ICD-10-CM | POA: Diagnosis not present

## 2017-12-16 DIAGNOSIS — K219 Gastro-esophageal reflux disease without esophagitis: Secondary | ICD-10-CM

## 2017-12-16 DIAGNOSIS — Z87820 Personal history of traumatic brain injury: Secondary | ICD-10-CM

## 2017-12-16 DIAGNOSIS — Z79899 Other long term (current) drug therapy: Secondary | ICD-10-CM

## 2017-12-16 DIAGNOSIS — E119 Type 2 diabetes mellitus without complications: Secondary | ICD-10-CM | POA: Diagnosis not present

## 2017-12-16 DIAGNOSIS — N401 Enlarged prostate with lower urinary tract symptoms: Secondary | ICD-10-CM

## 2017-12-16 HISTORY — DX: Cough, unspecified: R05.9

## 2017-12-16 MED ORDER — LISINOPRIL 40 MG PO TABS: 40.0000 mg | ORAL_TABLET | Freq: Every day | ORAL | 1 refills | Status: DC

## 2017-12-16 MED ORDER — AMLODIPINE BESYLATE 2.5 MG PO TABS: 2.5000 mg | ORAL_TABLET | Freq: Every day | ORAL | 1 refills | Status: DC

## 2017-12-16 MED ORDER — TAMSULOSIN HCL 0.4 MG PO CAPS: 0.4000 mg | ORAL_CAPSULE | Freq: Every day | ORAL | 2 refills | Status: DC

## 2017-12-16 MED ORDER — OMEPRAZOLE 20 MG PO CPDR: 20.0000 mg | DELAYED_RELEASE_CAPSULE | Freq: Every day | ORAL | 1 refills | Status: DC

## 2017-12-16 NOTE — Patient Instructions (Addendum)
INSTRUCCIONES DE SEGUIMIENTO Cuando: 01/16/2018 (cita ya programada) Para: Gestin de HTN y DM. Qu llevar: medicamentos, glucmetro   [name],  Fue un placer conocerte hoy.  Para su dolor de estmago, vamos a obtener algunos anlisis de sangre y [employer] una referencia a los mdicos de [place].  Para tu tos, he rellenado tu omeprazol. Por favor, tome 20 mg de omeprazol una vez al [employer]. Tambin vamos a obtener una radiografa de trax.     FOLLOW-UP INSTRUCTIONS When: 01/16/2018 (appointment already scheduled) For: HTN and DM management What to bring: medications, glucometer   [name],  It was good to meet you today.  For your stomach pain, we are going to get some bloodwork and send a referral to the stomach doctors.  For your cough, I have refilled your omeprazole. Please take 20mg  omeprazole once a day. We are also going to get a chest x-ray.
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
## 2017-12-16 NOTE — Assessment & Plan Note (Addendum)
Assessment Reports 8 months of epigastric pain.  He has a history of acalculus cholecystitis in May 2018, for which a percutaneous cholecystostomy tube was placed.  He was instructed to follow-up with general surgery at that time for possible cholecystectomy, however he states he was not able to get this done.  He is also had surgery for bleeding ulcer.  Referral for GI was placed at previous visit due to bloody stools, however he has not seen them yet. He does continue to endorse melena. Patient has multiple possible reasons for abdominal pain, including possible acalculous cholecystitis vs gastropathy vs ulcer. Will assess for obstruction/infection with labwork today and re-order GI referral.  Plan - GI referral placed - CBC and CMP today  ADDENDUM: CBC and CMP wnl, with the exception of Na 129, consistent with patient's known hyponatremia

## 2017-12-16 NOTE — Assessment & Plan Note (Addendum)
Assessment Patient has had 2 months of cough. No s/s to suggest postnasal drip or infectious symptoms. States he is taking prilosec, however last prescription was 06/2017 for a 90 day supply.  Plan - Refilled prilosec 20mg  daily

## 2017-12-16 NOTE — Assessment & Plan Note (Signed)
Assessment BP well controlled at 118/74  Plan - Refilled amlodipine 2.5mg  daily and lisinopril 40mg  daily

## 2017-12-16 NOTE — Assessment & Plan Note (Addendum)
Assessment 2 months of nonproductive cough. He states he is taking prilosec, however last Rx was a 90 day supply on 06/2017. No s/s to suggest postnasal drip or bronchitis. Will refill prilosec and continue to monitor. Patient is also a former smoker, so will order CXR to evaluate for possible mass.  Plan - CXR ordered - Refilled omeprazole 20mg  daily  ADDENDUM:  CXR shows chronic bronchitic changes, which could be the etiology of his cough. Alternatively, could consider holding lisinopril at next visit if cough continues to bother him.

## 2017-12-17 LAB — CBC WITH DIFFERENTIAL/PLATELET
BASOS ABS: 0 10*3/uL (ref 0.0–0.2)
Basos: 0 %
EOS (ABSOLUTE): 0.1 10*3/uL (ref 0.0–0.4)
Eos: 1 %
Hematocrit: 42.6 % (ref 37.5–51.0)
Hemoglobin: 14.4 g/dL (ref 13.0–17.7)
Immature Grans (Abs): 0.1 10*3/uL (ref 0.0–0.1)
Immature Granulocytes: 1 %
Lymphocytes Absolute: 1.5 10*3/uL (ref 0.7–3.1)
Lymphs: 19 %
MCH: 30.8 pg (ref 26.6–33.0)
MCHC: 33.8 g/dL (ref 31.5–35.7)
MCV: 91 fL (ref 79–97)
Monocytes Absolute: 0.8 10*3/uL (ref 0.1–0.9)
Monocytes: 10 %
NEUTROS ABS: 5.4 10*3/uL (ref 1.4–7.0)
NEUTROS PCT: 69 %
PLATELETS: 264 10*3/uL (ref 150–379)
RBC: 4.68 x10E6/uL (ref 4.14–5.80)
RDW: 13.9 % (ref 12.3–15.4)
WBC: 7.8 10*3/uL (ref 3.4–10.8)

## 2017-12-17 LAB — CMP14 + ANION GAP
A/G RATIO: 1.4 (ref 1.2–2.2)
ALK PHOS: 81 IU/L (ref 39–117)
ALT: 13 IU/L (ref 0–44)
AST: 19 IU/L (ref 0–40)
Albumin: 4.1 g/dL (ref 3.5–4.8)
Anion Gap: 17 mmol/L (ref 10.0–18.0)
BILIRUBIN TOTAL: 0.4 mg/dL (ref 0.0–1.2)
BUN/Creatinine Ratio: 22 (ref 10–24)
BUN: 19 mg/dL (ref 8–27)
CHLORIDE: 88 mmol/L — AB (ref 96–106)
CO2: 24 mmol/L (ref 20–29)
Calcium: 9.3 mg/dL (ref 8.6–10.2)
Creatinine, Ser: 0.86 mg/dL (ref 0.76–1.27)
GFR calc non Af Amer: 86 mL/min/{1.73_m2} (ref >59)
GFR, EST AFRICAN AMERICAN: 99 mL/min/{1.73_m2} (ref >59)
GLUCOSE: 93 mg/dL (ref 65–99)
Globulin, Total: 2.9 g/dL (ref 1.5–4.5)
POTASSIUM: 4.8 mmol/L (ref 3.5–5.2)
Sodium: 129 mmol/L — ABNORMAL LOW (ref 134–144)
Total Protein: 7 g/dL (ref 6.0–8.5)

## 2017-12-19 ENCOUNTER — Other Ambulatory Visit: Payer: Self-pay | Admitting: Internal Medicine

## 2017-12-19 DIAGNOSIS — M48062 Spinal stenosis, lumbar region with neurogenic claudication: Secondary | ICD-10-CM

## 2017-12-19 DIAGNOSIS — K219 Gastro-esophageal reflux disease without esophagitis: Secondary | ICD-10-CM

## 2017-12-19 DIAGNOSIS — I209 Angina pectoris, unspecified: Secondary | ICD-10-CM

## 2017-12-19 DIAGNOSIS — E119 Type 2 diabetes mellitus without complications: Secondary | ICD-10-CM

## 2017-12-19 DIAGNOSIS — I10 Essential (primary) hypertension: Secondary | ICD-10-CM

## 2017-12-20 MED ORDER — LISINOPRIL 40 MG PO TABS: 40.0000 mg | ORAL_TABLET | Freq: Every day | ORAL | 1 refills | Status: DC

## 2017-12-20 MED ORDER — PREGABALIN 25 MG PO CAPS: 25.0000 mg | ORAL_CAPSULE | Freq: Three times a day (TID) | ORAL | 0 refills | Status: DC

## 2017-12-20 NOTE — Progress Notes (Signed)
Internal Medicine Clinic Attending  Case discussed with Dr. Huang at the time of the visit.  We reviewed the resident's history and exam and pertinent patient test results.  I agree with the assessment, diagnosis, and plan of care documented in the resident's note. 

## 2017-12-23 NOTE — Telephone Encounter (Signed)
Lyrica rx called to [facility] pharmacy.

## 2017-12-31 ENCOUNTER — Other Ambulatory Visit: Payer: Self-pay | Admitting: Internal Medicine

## 2017-12-31 DIAGNOSIS — I10 Essential (primary) hypertension: Secondary | ICD-10-CM

## 2017-12-31 NOTE — Telephone Encounter (Signed)
Pharmacy requesting refill on lisinopril 40mg  tablet

## 2018-01-02 MED ORDER — LISINOPRIL 40 MG PO TABS: 40.0000 mg | ORAL_TABLET | Freq: Every day | ORAL | 1 refills | Status: DC

## 2018-01-16 ENCOUNTER — Other Ambulatory Visit: Payer: Self-pay

## 2018-01-16 ENCOUNTER — Encounter: Payer: Self-pay | Admitting: Internal Medicine

## 2018-01-16 ENCOUNTER — Ambulatory Visit (INDEPENDENT_AMBULATORY_CARE_PROVIDER_SITE_OTHER): Payer: Medicare Other | Admitting: Internal Medicine

## 2018-01-16 VITALS — BP 141/69 | HR 108 | Temp 98.2°F | Ht 65.0 in | Wt 166.5 lb

## 2018-01-16 DIAGNOSIS — R05 Cough: Secondary | ICD-10-CM | POA: Diagnosis not present

## 2018-01-16 DIAGNOSIS — R1084 Generalized abdominal pain: Secondary | ICD-10-CM

## 2018-01-16 DIAGNOSIS — R059 Cough, unspecified: Secondary | ICD-10-CM

## 2018-01-16 DIAGNOSIS — I10 Essential (primary) hypertension: Secondary | ICD-10-CM

## 2018-01-16 DIAGNOSIS — E119 Type 2 diabetes mellitus without complications: Secondary | ICD-10-CM

## 2018-01-16 LAB — GLUCOSE, CAPILLARY: GLUCOSE-CAPILLARY: 164 mg/dL — AB (ref 65–99)

## 2018-01-16 LAB — POCT GLYCOSYLATED HEMOGLOBIN (HGB A1C): Hemoglobin A1C: 6.1 % — AB (ref 4.0–5.6)

## 2018-01-16 NOTE — Assessment & Plan Note (Addendum)
Patient continues to have controlled DM on Metformin. He is no longer checking his blood sugar daily. Today his A1c is 6.1. We will continue with his current medication regimen.   Plan: - Continue Metformin 500 mg BID

## 2018-01-16 NOTE — Assessment & Plan Note (Signed)
Patient presented for continued evaluation and management of his HTN. He is currently on Lisinopril, metoprolol, and amlodipine with good control. He denies adverse reactions to the medications. He denies dizziness, syncope, pre-syncope, visual changes, abdominal pain. We will continue his current medication regimen.   Plan: - Continue Lisinopril 40 mg QD - Continue Amlodipine 2.5 mg QD - Continue Metoprolol succinate 50 mg QD

## 2018-01-16 NOTE — Patient Instructions (Addendum)
Thank you for allowing [place] to provide your care. Today we did the following:  1. We are getting a picture of your stomach. I will call you if you need to come back to the hospital.   Please follow up with the stomach doctors on 02/03/2018. Come back to see [place] in 6 months or sooner if any issues arise.

## 2018-01-16 NOTE — Assessment & Plan Note (Signed)
Patient voices for the >past 2 weeks he has had depressed mood. This stems from his chronic medical illnesses and deaths in the family. He has had decreased energy, lack of interest, changes in appetite, and increased sleep. He wishes most night to go to bed and not wake up. He does not have a specific plan for suicide and denies HI.   He was previously on Effexor.

## 2018-01-16 NOTE — Assessment & Plan Note (Addendum)
Patient presents today with acute on chronic generalized abdominal pain that may be more isolated to his epigastric region. Food appears to make the pain worse and he has had early satiety with nonbloody emesis. He has not had a bowel movement in 3 days but feels that his stomach is constantly turning. He is still passing gas. Review of chart indicates that the patient has had multiple abdominal surgeries including a partial gastrectomy, ulcer perforation, and cholecystectomy. He has an appointment scheduled with GI on 6/17 for further evaluation.   Review of labs obtained last month without obvious abnormalities aside from chronic hyponatremia which I would not expect to cause his acute abdominal pain. At this point my leading dx is a partial bowel obstruction. I discussed further imaging with the patient but he declined today. We discussed that if this is in fact a partial bowel obstruction and become complete he could be at risk for perforation, infection, and even death. He voices understanding and will call the ambulance if his symptoms change.   Plan: - Abdominal films to look for high grade obstruction  - GI scheduled for 02/03/2018

## 2018-01-16 NOTE — Assessment & Plan Note (Signed)
Resolved
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
## 2018-01-17 LAB — TSH: TSH: 1.61 u[IU]/mL (ref 0.450–4.500)

## 2018-01-17 NOTE — Progress Notes (Signed)
Medicine attending: Medical history, presenting problems, physical findings, and medications, reviewed with resident physician Dr Justin Helberg on the day of the patient visit and I concur with his evaluation and management plan. 

## 2018-01-20 ENCOUNTER — Ambulatory Visit (HOSPITAL_COMMUNITY)
Admission: RE | Admit: 2018-01-20 | Discharge: 2018-01-20 | Disposition: A | Discharge status: Home / Self Care | Payer: Medicare Other | Source: Ambulatory Visit | Attending: Internal Medicine | Admitting: Internal Medicine

## 2018-01-20 DIAGNOSIS — Z9889 Other specified postprocedural states: Secondary | ICD-10-CM | POA: Diagnosis not present

## 2018-01-20 DIAGNOSIS — R195 Other fecal abnormalities: Secondary | ICD-10-CM | POA: Diagnosis not present

## 2018-01-20 DIAGNOSIS — R1084 Generalized abdominal pain: Secondary | ICD-10-CM | POA: Diagnosis present

## 2018-01-25 IMAGING — CR DG ABDOMEN 1V
1 series · 1 of 1 positions shown · non-contrast
Comparison: None

CLINICAL DATA: Abdominal pain. The patient has a gallbladder
catheter.

EXAM:
ABDOMEN - 1 VIEW

[AP]
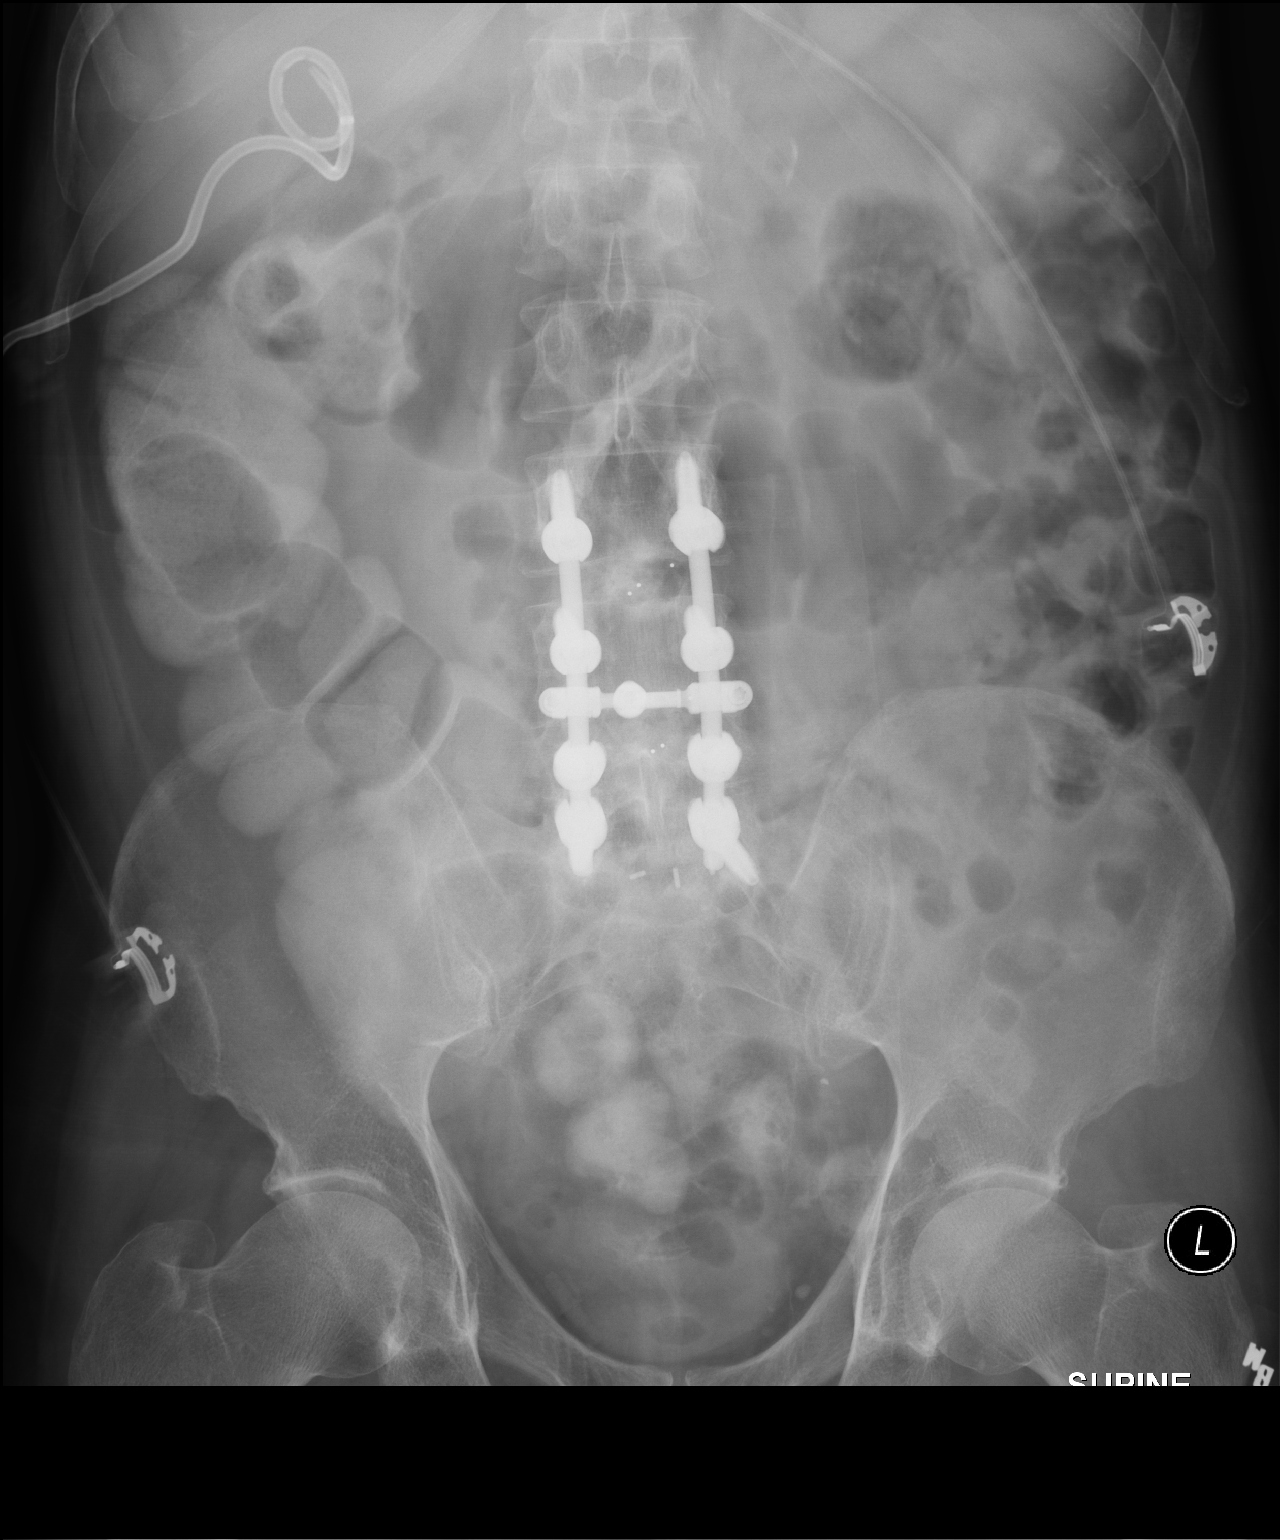

[1 of 1 positions shown; findings below may reference images not displayed]

FINDINGS: The right upper quadrant catheter is again identified. Contrast in
the ascending colon is from the recent CT scan. No bowel
obstruction. Surgical hardware in the lumbar spine. No other acute
abnormalities.
IMPRESSION: No acute abnormalities.  Right upper quadrant drain.

## 2018-02-03 ENCOUNTER — Ambulatory Visit (INDEPENDENT_AMBULATORY_CARE_PROVIDER_SITE_OTHER): Payer: Medicare Other | Admitting: Gastroenterology

## 2018-02-03 ENCOUNTER — Encounter: Payer: Self-pay | Admitting: Gastroenterology

## 2018-02-03 VITALS — BP 110/66 | HR 104 | Ht 65.25 in | Wt 166.0 lb

## 2018-02-03 DIAGNOSIS — R1084 Generalized abdominal pain: Secondary | ICD-10-CM | POA: Diagnosis not present

## 2018-02-03 DIAGNOSIS — R140 Abdominal distension (gaseous): Secondary | ICD-10-CM

## 2018-02-03 DIAGNOSIS — K5909 Other constipation: Secondary | ICD-10-CM

## 2018-02-03 MED ORDER — LINACLOTIDE 72 MCG PO CAPS: 72.0000 ug | ORAL_CAPSULE | Freq: Every day | ORAL | 0 refills | Status: DC
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
## 2018-02-03 NOTE — Patient Instructions (Addendum)
Miralax one capful in a glass of water or juice once daily.  If no relief of constipation after a week, increase to one capful twice daily.  If no relief, or there are side effects from the miralax, then try the samples of Linzess instead.  One tablet once daily.  Call for advice if needed.  Samples of this drug were given to the patient, quantity #12, Lot Number [id number] exp 05-2018  It was a pleasure to meet you today!  Dr. Loletha Carrow

## 2018-04-08 ENCOUNTER — Other Ambulatory Visit: Payer: Self-pay | Admitting: Internal Medicine

## 2018-04-08 DIAGNOSIS — I209 Angina pectoris, unspecified: Secondary | ICD-10-CM

## 2018-04-08 DIAGNOSIS — I10 Essential (primary) hypertension: Secondary | ICD-10-CM

## 2018-04-29 ENCOUNTER — Other Ambulatory Visit: Payer: Self-pay | Admitting: Internal Medicine

## 2018-04-29 DIAGNOSIS — K219 Gastro-esophageal reflux disease without esophagitis: Secondary | ICD-10-CM

## 2018-04-29 DIAGNOSIS — N401 Enlarged prostate with lower urinary tract symptoms: Secondary | ICD-10-CM

## 2018-04-29 DIAGNOSIS — I10 Essential (primary) hypertension: Secondary | ICD-10-CM

## 2018-04-29 DIAGNOSIS — E119 Type 2 diabetes mellitus without complications: Secondary | ICD-10-CM

## 2018-05-07 ENCOUNTER — Ambulatory Visit (INDEPENDENT_AMBULATORY_CARE_PROVIDER_SITE_OTHER): Payer: Medicare Other | Admitting: Gastroenterology

## 2018-05-07 ENCOUNTER — Ambulatory Visit: Payer: Medicare Other | Admitting: Gastroenterology

## 2018-05-07 ENCOUNTER — Encounter: Payer: Self-pay | Admitting: Gastroenterology

## 2018-05-07 VITALS — BP 116/70 | HR 76 | Ht 65.25 in | Wt 163.5 lb

## 2018-05-07 DIAGNOSIS — G8929 Other chronic pain: Secondary | ICD-10-CM | POA: Diagnosis not present

## 2018-05-07 DIAGNOSIS — R194 Change in bowel habit: Secondary | ICD-10-CM

## 2018-05-07 DIAGNOSIS — R1013 Epigastric pain: Secondary | ICD-10-CM

## 2018-05-07 NOTE — Patient Instructions (Signed)
You have been scheduled for an endoscopy and colonoscopy. Please follow the written instructions given to you at your visit today. Please pick up your prep supplies at the pharmacy within the next 1-3 days. If you use inhalers (even only as needed), please bring them with you on the day of your procedure.  Normal BMI (Body Mass Index- based on height and weight) is between 23 and 30. Your BMI today is Body mass index is 27 kg/m. Marland Kitchen Please consider follow up  regarding your BMI with your Primary Care Provider.
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
## 2018-05-12 ENCOUNTER — Encounter: Payer: Self-pay | Admitting: Gastroenterology

## 2018-05-12 DIAGNOSIS — G8929 Other chronic pain: Secondary | ICD-10-CM | POA: Insufficient documentation

## 2018-05-12 DIAGNOSIS — R194 Change in bowel habit: Secondary | ICD-10-CM | POA: Insufficient documentation

## 2018-05-12 DIAGNOSIS — R1013 Epigastric pain: Secondary | ICD-10-CM

## 2018-05-12 HISTORY — DX: Change in bowel habit: R19.4

## 2018-05-12 NOTE — Progress Notes (Signed)
Thank you for sending this case to me. I have reviewed the entire note, and the outlined plan seems appropriate.  The endoscopic workup is reasonable.  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  He also probably has a dysmotility.  Wilfrid Lund, MD

## 2018-06-17 ENCOUNTER — Other Ambulatory Visit: Payer: Self-pay | Admitting: Internal Medicine

## 2018-06-17 DIAGNOSIS — I10 Essential (primary) hypertension: Secondary | ICD-10-CM

## 2018-06-17 DIAGNOSIS — I209 Angina pectoris, unspecified: Secondary | ICD-10-CM

## 2018-06-17 DIAGNOSIS — E119 Type 2 diabetes mellitus without complications: Secondary | ICD-10-CM

## 2018-06-18 ENCOUNTER — Encounter: Payer: Medicare Other | Admitting: Gastroenterology

## 2018-06-19 ENCOUNTER — Encounter: Payer: Self-pay | Admitting: Gastroenterology

## 2018-06-19 ENCOUNTER — Other Ambulatory Visit: Payer: Self-pay | Admitting: *Deleted

## 2018-06-19 ENCOUNTER — Ambulatory Visit (AMBULATORY_SURGERY_CENTER): Payer: Medicare Other | Admitting: Gastroenterology

## 2018-06-19 VITALS — BP 111/61 | HR 73 | Temp 97.1°F | Resp 21 | Ht 65.25 in | Wt 163.0 lb

## 2018-06-19 DIAGNOSIS — D509 Iron deficiency anemia, unspecified: Secondary | ICD-10-CM

## 2018-06-19 DIAGNOSIS — R194 Change in bowel habit: Secondary | ICD-10-CM

## 2018-06-19 DIAGNOSIS — R140 Abdominal distension (gaseous): Secondary | ICD-10-CM | POA: Diagnosis not present

## 2018-06-19 DIAGNOSIS — D123 Benign neoplasm of transverse colon: Secondary | ICD-10-CM

## 2018-06-19 DIAGNOSIS — D122 Benign neoplasm of ascending colon: Secondary | ICD-10-CM

## 2018-06-19 DIAGNOSIS — R1084 Generalized abdominal pain: Secondary | ICD-10-CM

## 2018-06-19 DIAGNOSIS — R1013 Epigastric pain: Secondary | ICD-10-CM

## 2018-06-19 DIAGNOSIS — K635 Polyp of colon: Secondary | ICD-10-CM | POA: Diagnosis not present

## 2018-06-19 DIAGNOSIS — G8929 Other chronic pain: Secondary | ICD-10-CM

## 2018-06-19 DIAGNOSIS — K219 Gastro-esophageal reflux disease without esophagitis: Secondary | ICD-10-CM

## 2018-06-19 MED ORDER — SODIUM CHLORIDE 0.9 % IV SOLN: 500.0000 mL | Freq: Once | INTRAVENOUS | Status: DC

## 2018-06-19 MED ORDER — OMEPRAZOLE 20 MG PO CPDR: DELAYED_RELEASE_CAPSULE | ORAL | 0 refills | Status: DC

## 2018-06-19 NOTE — Op Note (Signed)
[facility] Patient Name: [name] Procedure Date: 06/19/2018 2:59 PM MRN: [id number] Endoscopist: Welcome. Loletha Carrow , MD Age: 72 Referring MD:  Date of Birth: Jan 18, 1946 Gender: Male Account #: [id number] Procedure:                Colonoscopy Indications:              Generalized abdominal pain, Iron deficiency with                            normal hemoglobin, bloating (see EGD report same                            day) Medicines:                Monitored Anesthesia Care Procedure:                Pre-Anesthesia Assessment:                           - Prior to the procedure, a History and Physical                            was performed, and patient medications and                            allergies were reviewed. The patient's tolerance of                            previous anesthesia was also reviewed. The risks                            and benefits of the procedure and the sedation                            options and risks were discussed with the patient.                            All questions were answered, and informed consent                            was obtained. Prior Anticoagulants: The patient has                            taken no previous anticoagulant or antiplatelet                            agents. ASA Grade Assessment: III - A patient with                            severe systemic disease. After reviewing the risks                            and benefits, the patient was deemed in  satisfactory condition to undergo the procedure.                           After obtaining informed consent, the colonoscope                            was passed under direct vision. Throughout the                            procedure, the patient's blood pressure, pulse, and                            oxygen saturations were monitored continuously. The                            Colonoscope was introduced through the anus and                             advanced to the the cecum, identified by                            appendiceal orifice and ileocecal valve. The                            colonoscopy was performed without difficulty. The                            patient tolerated the procedure well. The quality                            of the bowel preparation was good. The ileocecal                            valve, appendiceal orifice, and rectum were                            photographed. The quality of the bowel preparation                            was evaluated using the BBPS [facility] Bowel                            Preparation Scale) with scores of: Right Colon = 2,                            Transverse Colon = 2 and Left Colon = 2. The total                            BBPS score equals 6. Scope In: 3:09:05 PM Scope Out: 3:23:49 PM Scope Withdrawal Time: 0 hours 12 minutes 49 seconds  Total Procedure Duration: 0 hours 14 minutes 44 seconds  Findings:                 The perianal and digital rectal examinations  were                            normal.                           Three sessile polyps were found in the ascending                            colon. The polyps were 2 to 4 mm in size. These                            polyps were removed with a cold biopsy forceps.                            Resection and retrieval were complete.                           A 5 mm polyp was found in the transverse colon. The                            polyp was sessile. The polyp was removed with a                            cold snare. Resection and retrieval were complete.                           The exam was otherwise without abnormality on                            direct and retroflexion views. Complications:            No immediate complications. Estimated Blood Loss:     Estimated blood loss: none. Impression:               - Three 2 to 4 mm polyps in the ascending colon,                             removed with a cold biopsy forceps. Resected and                            retrieved.                           - One 5 mm polyp in the transverse colon, removed                            with a cold snare. Resected and retrieved.                           - The examination was otherwise normal on direct                            and retroflexion views.  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 Recommendation:           - Patient has a contact number available for                            emergencies. The signs and symptoms of potential                            delayed complications were discussed with the                            patient. Return to normal activities tomorrow.                            Written discharge instructions were provided to the                            patient.                           - Resume previous diet.                           - Continue present medications.                           - Await pathology results.                           - see EGD report for findings and recommendations                           - Repeat colonoscopy is recommended for                            surveillance. The colonoscopy date will be                            determined after pathology results from today's                            exam become available for review. Aodhan Scheidt L. Loletha Carrow, MD 06/19/2018 3:36:56 PM This report has been signed electronically.

## 2018-06-19 NOTE — Progress Notes (Signed)
A and O x3. Report to RN. Tolerated MAC anesthesia well.Teeth unchanged after procedure.

## 2018-06-19 NOTE — Progress Notes (Signed)
Called to room to assist during endoscopic procedure.  Patient ID and intended procedure confirmed with present staff. Received instructions for my participation in the procedure from the performing physician.  

## 2018-06-19 NOTE — Op Note (Signed)
[facility] Patient Name: [name] Procedure Date: 06/19/2018 3:00 PM MRN: [id number] Endoscopist: Mallie Mussel L. Loletha Carrow , MD Age: 72 Referring MD:  Date of Birth: 03/14/1946 Gender: Male Account #: [id number] Procedure:                Upper GI endoscopy Indications:              Generalized abdominal pain, Iron deficiency with                            normal hemoglobin, Abdominal bloating (endoscopic                            workup in 2011; empiric therapy for SIBO without                            improvement at that time) Medicines:                Monitored Anesthesia Care Procedure:                Pre-Anesthesia Assessment:                           - Prior to the procedure, a History and Physical                            was performed, and patient medications and                            allergies were reviewed. The patient's tolerance of                            previous anesthesia was also reviewed. The risks                            and benefits of the procedure and the sedation                            options and risks were discussed with the patient.                            All questions were answered, and informed consent                            was obtained. Prior Anticoagulants: The patient has                            taken no previous anticoagulant or antiplatelet                            agents. ASA Grade Assessment: III - A patient with                            severe systemic disease. After reviewing the risks  and benefits, the patient was deemed in                            satisfactory condition to undergo the procedure.                           After obtaining informed consent, the endoscope was                            passed under direct vision. Throughout the                            procedure, the patient's blood pressure, pulse, and                            oxygen saturations were  monitored continuously. The                            Model GIF-HQ190 [id number]) scope was introduced                            through the mouth, and advanced to the afferent and                            efferent jejunal loops. The upper GI endoscopy was                            accomplished without difficulty. The patient                            tolerated the procedure well. Scope In: Scope Out: Findings:                 The esophagus was normal.                           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 The gastrojejunal anastomosis was                            characterized by healthy appearing mucosa. The                            efferent limb was examined. The afferent limb was                            examined.                           The exam of the stomach was otherwise normal.                           The examined jejunum was normal. Complications:  No immediate complications. Estimated Blood Loss:     Estimated blood loss: none. Impression:               - Normal esophagus.                           - Patent Billroth II gastrojejunostomy was found,                            characterized by healthy appearing mucosa.                           - Normal examined jejunum.                           - No specimens collected.                           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 Recommendation:           - Patient has a contact number available for                            emergencies. The signs and symptoms of potential                            delayed complications were discussed with the                            patient. Return to normal activities tomorrow.                            Written discharge instructions were provided to the                            patient.                           - Resume  previous diet.                           - Continue present medications.                           - Follow up with primary care to monitor hemoglobin                            and iron levels and treat eith IV iron as needed.                           - See the other procedure note for documentation of                            additional recommendations.  Henry L. Loletha Carrow, MD 06/19/2018 3:31:08 PM This report has been signed electronically.

## 2018-06-19 NOTE — Patient Instructions (Addendum)
Discharge instructions given. Handout on polyps. Resume previous medications. YOU HAD AN ENDOSCOPIC PROCEDURE TODAY AT [facility]:   Refer to the procedure report that was given to you for any specific questions about what was found during the examination.  If the procedure report does not answer your questions, please call your gastroenterologist to clarify.  If you requested that your care partner not be given the details of your procedure findings, then the procedure report has been included in a sealed envelope for you to review at your convenience later.  YOU SHOULD EXPECT: Some feelings of bloating in the abdomen. Passage of more gas than usual.  Walking can help get rid of the air that was put into your GI tract during the procedure and reduce the bloating. If you had a lower endoscopy (such as a colonoscopy or flexible sigmoidoscopy) you may notice spotting of blood in your stool or on the toilet paper. If you underwent a bowel prep for your procedure, you may not have a normal bowel movement for a few days.  Please Note:  You might notice some irritation and congestion in your nose or some drainage.  This is from the oxygen used during your procedure.  There is no need for concern and it should clear up in a day or so.  SYMPTOMS TO REPORT IMMEDIATELY:   Following lower endoscopy (colonoscopy or flexible sigmoidoscopy):  Excessive amounts of blood in the stool  Significant tenderness or worsening of abdominal pains  Swelling of the abdomen that is new, acute  Fever of 100F or higher   Following upper endoscopy (EGD)  Vomiting of blood or coffee ground material  New chest pain or pain under the shoulder blades  Painful or persistently difficult swallowing  New shortness of breath  Fever of 100F or higher  Black, tarry-looking stools  For urgent or emergent issues, a gastroenterologist can be reached at any hour by calling [phone].   DIET:  We do  recommend a small meal at first, but then you may proceed to your regular diet.  Drink plenty of fluids but you should avoid alcoholic beverages for 24 hours.  ACTIVITY:  You should plan to take it easy for the rest of today and you should NOT DRIVE or use heavy machinery until tomorrow (because of the sedation medicines used during the test).    FOLLOW UP: Our staff will call the number listed on your records the next business day following your procedure to check on you and address any questions or concerns that you may have regarding the information given to you following your procedure. If we do not reach you, we will leave a message.  However, if you are feeling well and you are not experiencing any problems, there is no need to return our call.  We will assume that you have returned to your regular daily activities without incident.  If any biopsies were taken you will be contacted by phone or by letter within the next 1-3 weeks.  Please call us at [phone] if you have not heard about the biopsies in 3 weeks.    SIGNATURES/CONFIDENTIALITY: You and/or your care partner have signed paperwork which will be entered into your electronic medical record.  These signatures attest to the fact that that the information above on your After Visit Summary has been reviewed and is understood.  Full responsibility of the confidentiality of this discharge information lies with you and/or your care-partner.  _______________________________________________________________________________  Food Guidelines for gas and bloating  Many people have difficulty digesting certain foods, causing a variety of distressing and embarrassing symptoms such as abdominal pain, bloating and gas.  These foods may need to be avoided or consumed in small amounts.  Here are some tips that might be helpful for you.  1.   Lactose intolerance is the difficulty or complete inability to digest lactose, the natural sugar in milk  and anything made from milk.  This condition is harmless, common, and can begin any time during life.  Some people can digest a modest amount of lactose while others cannot tolerate any.  Also, not all dairy products contain equal amounts of lactose.  For example, hard cheeses such as parmesan have less lactose than soft cheeses such as cheddar.  Yogurt has less lactose than milk or cheese.  Many packaged foods (even many brands of bread) have milk, so read ingredient lists carefully.  It is difficult to test for lactose intolerance, so just try avoiding lactose as much as possible for a week and see what happens with your symptoms.  If you seem to be lactose intolerant, the best plan is to avoid it (but make sure you get calcium from another source).  The next best thing is to use lactase enzyme supplements, available over the counter everywhere.  Just know that many lactose intolerant people need to take several tablets with each serving of dairy to avoid symptoms.  Lastly, a lot of restaurant food is made with milk or butter.  Many are things you might not suspect, such as mashed potatoes, rice and pasta (cooked with butter) and "grilled" items.  If you are lactose intolerant, it never hurts to ask your server what has milk or butter.  2.   Fiber is an important part of your diet, but not all fiber is well-tolerated.  Insoluble fiber such as bran is often consumed by normal gut bacteria and converted into gas.  Soluble fiber such as oats, squash, carrots and green beans are typically tolerated better.  3.   Some types of carbohydrates can be poorly digested.  Examples include: fructose (apples, cherries, pears, raisins and other dried fruits), fructans (onions, zucchini, large amounts of wheat), sorbitol/mannitol/xylitol and sucralose/Splenda (common artificial sweeteners), and raffinose (lentils, broccoli, cabbage, asparagus, brussel sprouts, many types of beans).  Do a Development worker, community for [employer] and you  will find helpful information. Beano, a dietary supplement, will often help with raffinose-containing foods.  As with lactase tablets, you may need several per serving.  4.   Whenever possible, avoid processed food&meats and chemical additives.  High fructose corn syrup, a common sweetener, may be difficult to digest.  Eggs and soy (comes from the soybean, and added to many foods now) are other common bloating/gassy foods.  - Dr. Herma Ard [facility]

## 2018-06-20 ENCOUNTER — Telehealth: Payer: Self-pay

## 2018-06-20 NOTE — Telephone Encounter (Signed)
  Follow up Call-  Call back number 06/19/2018 01/03/2016  Post procedure Call Back phone  # [phone]  Permission to leave phone message Yes -  Some recent data might be hidden     Patient questions:  Do you have a fever, pain , or abdominal swelling? No. Pain Score  0 *  Have you tolerated food without any problems? Yes.    Have you been able to return to your normal activities? Yes.    Do you have any questions about your discharge instructions: Diet   No. Medications  No. Follow up visit  No.  Do you have questions or concerns about your Care? No.  Actions: * If pain score is 4 or above: No action needed, pain <4.

## 2018-06-22 ENCOUNTER — Other Ambulatory Visit: Payer: Self-pay | Admitting: Oncology

## 2018-07-01 ENCOUNTER — Other Ambulatory Visit: Payer: Self-pay

## 2018-07-01 ENCOUNTER — Encounter: Payer: Self-pay | Admitting: Gastroenterology

## 2018-07-01 MED ORDER — CEPHALEXIN 250 MG PO CAPS: 250.0000 mg | ORAL_CAPSULE | Freq: Four times a day (QID) | ORAL | 0 refills | Status: DC

## 2018-07-01 MED ORDER — METRONIDAZOLE 250 MG PO TABS: 250.0000 mg | ORAL_TABLET | Freq: Four times a day (QID) | ORAL | 0 refills | Status: DC

## 2018-07-16 ENCOUNTER — Encounter: Payer: Self-pay | Admitting: Gastroenterology

## 2018-09-22 ENCOUNTER — Other Ambulatory Visit: Payer: Self-pay | Admitting: *Deleted

## 2018-09-22 DIAGNOSIS — M48062 Spinal stenosis, lumbar region with neurogenic claudication: Secondary | ICD-10-CM

## 2018-09-23 MED ORDER — PREGABALIN 25 MG PO CAPS: 25.0000 mg | ORAL_CAPSULE | Freq: Three times a day (TID) | ORAL | 0 refills | Status: DC

## 2018-09-24 ENCOUNTER — Other Ambulatory Visit: Payer: Self-pay | Admitting: *Deleted

## 2018-09-24 DIAGNOSIS — N401 Enlarged prostate with lower urinary tract symptoms: Secondary | ICD-10-CM

## 2018-09-25 MED ORDER — TAMSULOSIN HCL 0.4 MG PO CAPS: ORAL_CAPSULE | ORAL | 3 refills | Status: DC

## 2018-09-26 ENCOUNTER — Other Ambulatory Visit: Payer: Self-pay | Admitting: *Deleted

## 2018-09-30 MED ORDER — AMLODIPINE BESYLATE 2.5 MG PO TABS: 2.5000 mg | ORAL_TABLET | Freq: Every day | ORAL | 3 refills | Status: DC

## 2018-10-16 ENCOUNTER — Encounter: Payer: Self-pay | Admitting: Internal Medicine

## 2018-10-16 ENCOUNTER — Ambulatory Visit (INDEPENDENT_AMBULATORY_CARE_PROVIDER_SITE_OTHER): Payer: Medicare Other | Admitting: Internal Medicine

## 2018-10-16 ENCOUNTER — Other Ambulatory Visit: Payer: Self-pay

## 2018-10-16 VITALS — BP 167/82 | HR 64 | Temp 97.9°F | Ht 65.4 in | Wt 170.0 lb

## 2018-10-16 DIAGNOSIS — E119 Type 2 diabetes mellitus without complications: Secondary | ICD-10-CM | POA: Diagnosis not present

## 2018-10-16 DIAGNOSIS — I10 Essential (primary) hypertension: Secondary | ICD-10-CM | POA: Diagnosis not present

## 2018-10-16 DIAGNOSIS — Z0000 Encounter for general adult medical examination without abnormal findings: Secondary | ICD-10-CM | POA: Diagnosis not present

## 2018-10-16 DIAGNOSIS — Z7984 Long term (current) use of oral hypoglycemic drugs: Secondary | ICD-10-CM

## 2018-10-16 DIAGNOSIS — Z79899 Other long term (current) drug therapy: Secondary | ICD-10-CM | POA: Diagnosis not present

## 2018-10-16 LAB — POCT GLYCOSYLATED HEMOGLOBIN (HGB A1C): Hemoglobin A1C: 6.1 % — AB (ref 4.0–5.6)

## 2018-10-16 LAB — GLUCOSE, CAPILLARY: Glucose-Capillary: 100 mg/dL — ABNORMAL HIGH (ref 70–99)

## 2018-10-16 MED ORDER — AMLODIPINE BESYLATE 5 MG PO TABS: 5.0000 mg | ORAL_TABLET | Freq: Every day | ORAL | 2 refills | Status: DC

## 2018-10-16 NOTE — Assessment & Plan Note (Addendum)
Patient needs screening for hepatitis C.
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
## 2018-10-16 NOTE — Assessment & Plan Note (Signed)
Patient presents for continued evaluation and management of his hypertension. He is currently on lisinopril 40 mg, metoprolol succinate 50 mg, and amlodipine 2.5 mg daily. He is not having any side effects to these medications. He is not had any recent falls. He is not having any financial difficulties obtaining these medications.  A/P: - BP is above goal of 130/80 - Will lisinopril 40 mg and metoprolol succinate 50 mg - Increase amlodipine to 5 mg QD - Check BMP

## 2018-10-16 NOTE — Progress Notes (Signed)
Case discussed with Dr. Helberg at the time of the visit. We reviewed the resident's history and exam and pertinent patient test results. I agree with the assessment, diagnosis, and plan of care documented in the resident's note. 

## 2018-10-16 NOTE — Patient Instructions (Signed)
Thank you for allowing me to provide your care. Today we are making the following changes.  1. Your diabetes is very well controlled. Please continue your medications as prescribed.  2. For your hypertension we are increasing your amlodipine 5 mg daily. In addition to discontinue your lisinopril 40 mg in your metoprolol succinate 50 mg. We are also checking some blood work and I will call you if anything is abnormal.  Please come back to see me in three months or sooner if anything arises. _____________________________________________________________  Clayburn Pert por permitirme brindarle su atencin. Hoy estamos [employer].  1. Su diabetes est muy bien controlada. Por favor, contine con sus medicamentos segn lo prescrito.  2. Para su hipertensin estamos aumentando su amlodipino 5 mg al da. Adems de interrumpir el lisinopril 40 mg en el succinato de metoprolol 50 mg. Tambin estamos revisando algunos anlisis de [place] y te llamar si algo es anormal.  Por favor, vuelve a verme en tres meses o antes si surge algo.

## 2018-10-16 NOTE — Assessment & Plan Note (Signed)
Presents for continued evaluation and management of his well-controlled diabetes mellitus. He is currently only on metformin 500 mg BID. He is not experiencing any side effects to these medications. He is currently on an ACE inhibitor and statin therapy. He continues to be active. Tries to avoid high carb foods.  A/P: - A1c at goal - Continue Metformin 500 mg BID  - Continue ACE and Statin  - Need PNA and Influenza vaccine

## 2018-10-17 ENCOUNTER — Other Ambulatory Visit: Payer: Self-pay | Admitting: Internal Medicine

## 2018-10-17 DIAGNOSIS — E119 Type 2 diabetes mellitus without complications: Secondary | ICD-10-CM

## 2018-10-17 DIAGNOSIS — I10 Essential (primary) hypertension: Secondary | ICD-10-CM

## 2018-10-17 LAB — BMP8+ANION GAP
ANION GAP: 16 mmol/L (ref 10.0–18.0)
BUN/Creatinine Ratio: 10 (ref 10–24)
BUN: 8 mg/dL (ref 8–27)
CHLORIDE: 91 mmol/L — AB (ref 96–106)
CO2: 24 mmol/L (ref 20–29)
Calcium: 9.3 mg/dL (ref 8.6–10.2)
Creatinine, Ser: 0.8 mg/dL (ref 0.76–1.27)
GFR calc Af Amer: 102 mL/min/{1.73_m2} (ref >59)
GFR calc non Af Amer: 88 mL/min/{1.73_m2} (ref >59)
Glucose: 91 mg/dL (ref 65–99)
Potassium: 5.1 mmol/L (ref 3.5–5.2)
Sodium: 131 mmol/L — ABNORMAL LOW (ref 134–144)

## 2018-10-17 LAB — HEPATITIS C ANTIBODY: Hep C Virus Ab: <0.1 s/co ratio (ref 0.0–0.9)

## 2018-10-22 ENCOUNTER — Other Ambulatory Visit: Payer: Self-pay | Admitting: *Deleted

## 2018-10-22 DIAGNOSIS — K219 Gastro-esophageal reflux disease without esophagitis: Secondary | ICD-10-CM

## 2018-10-22 MED ORDER — OMEPRAZOLE 20 MG PO CPDR: DELAYED_RELEASE_CAPSULE | ORAL | 0 refills | Status: DC

## 2019-01-19 IMAGING — DX DG CHEST 2V
2 series · 2 of 2 positions shown · non-contrast
Comparison: Chest x-ray dated December 18, 2016

CLINICAL DATA: Persistent cough for the past 2 months. Former
smoker. History of coronary artery disease, hypertension, emphysema,
and diabetes.

EXAM:
CHEST - 2 VIEW

[chest pa]
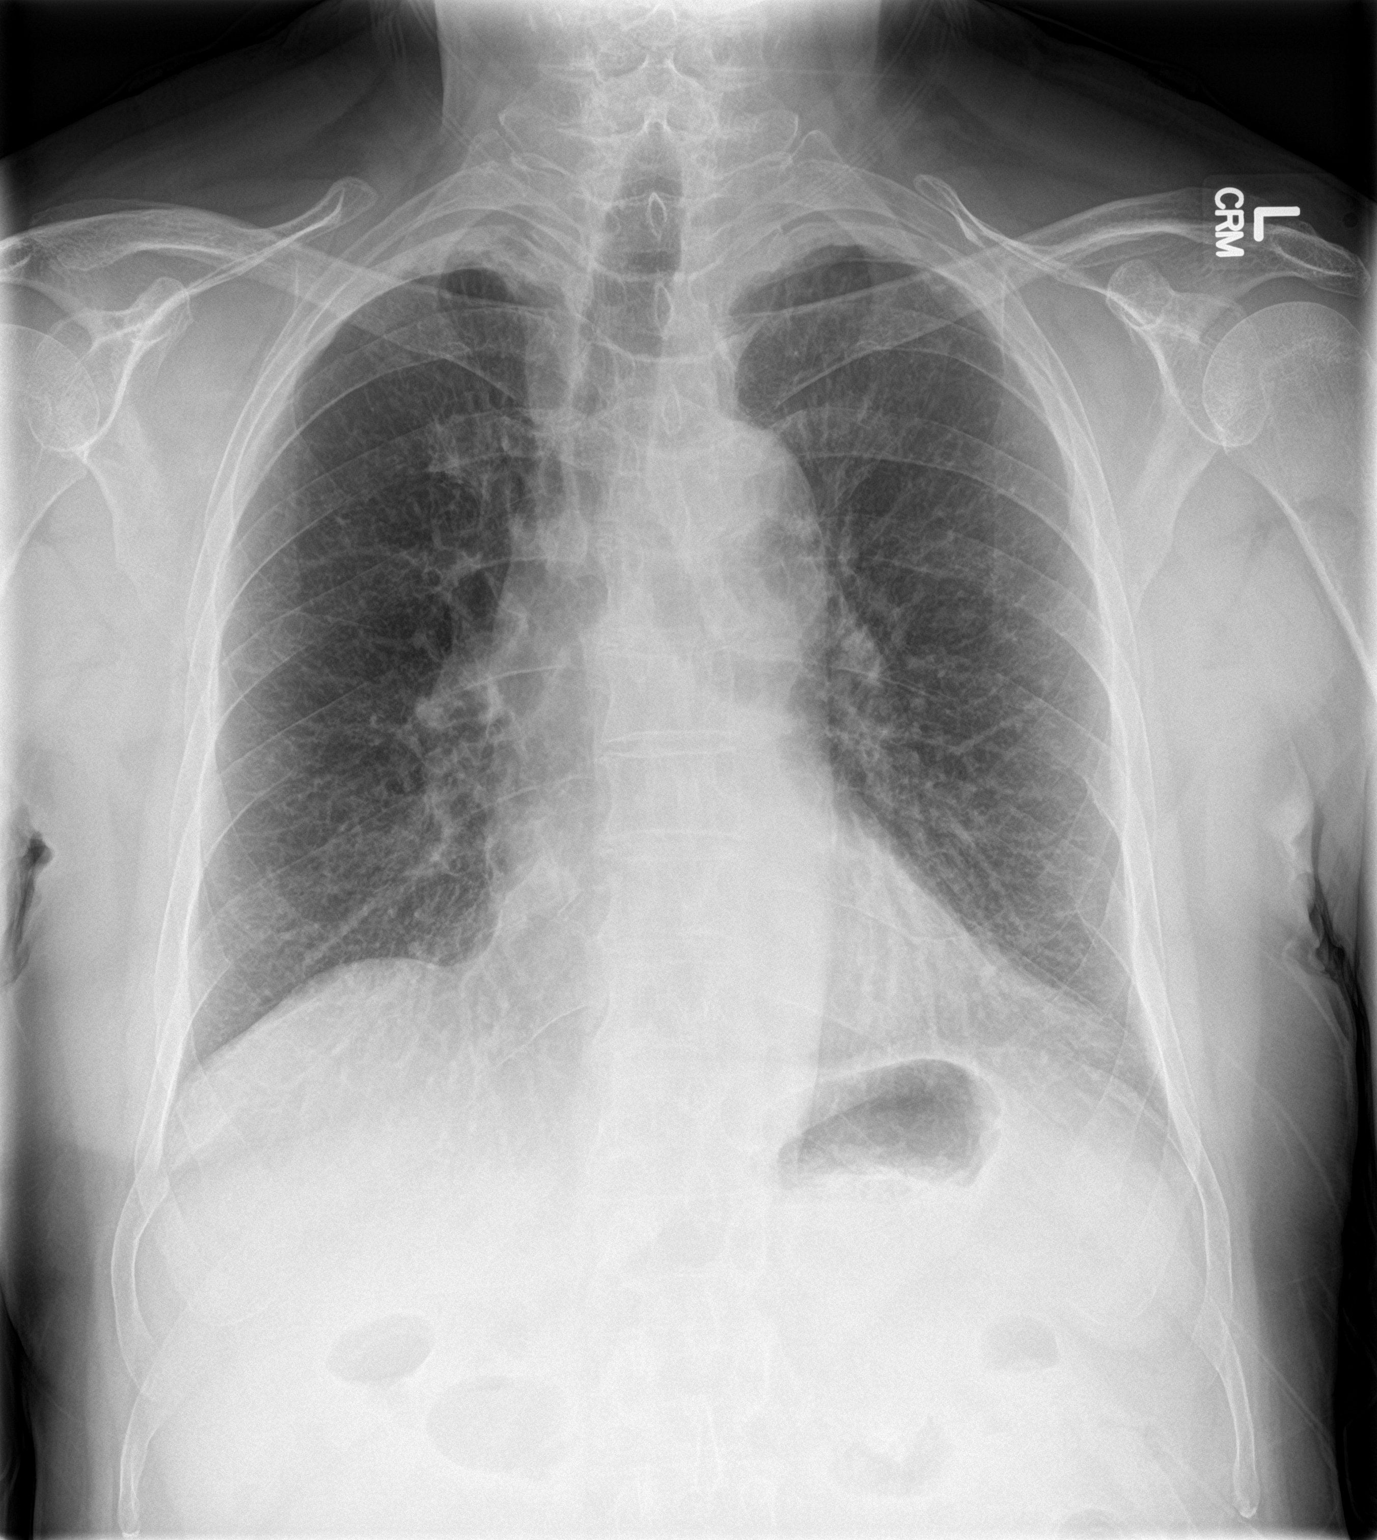

[chest lat]
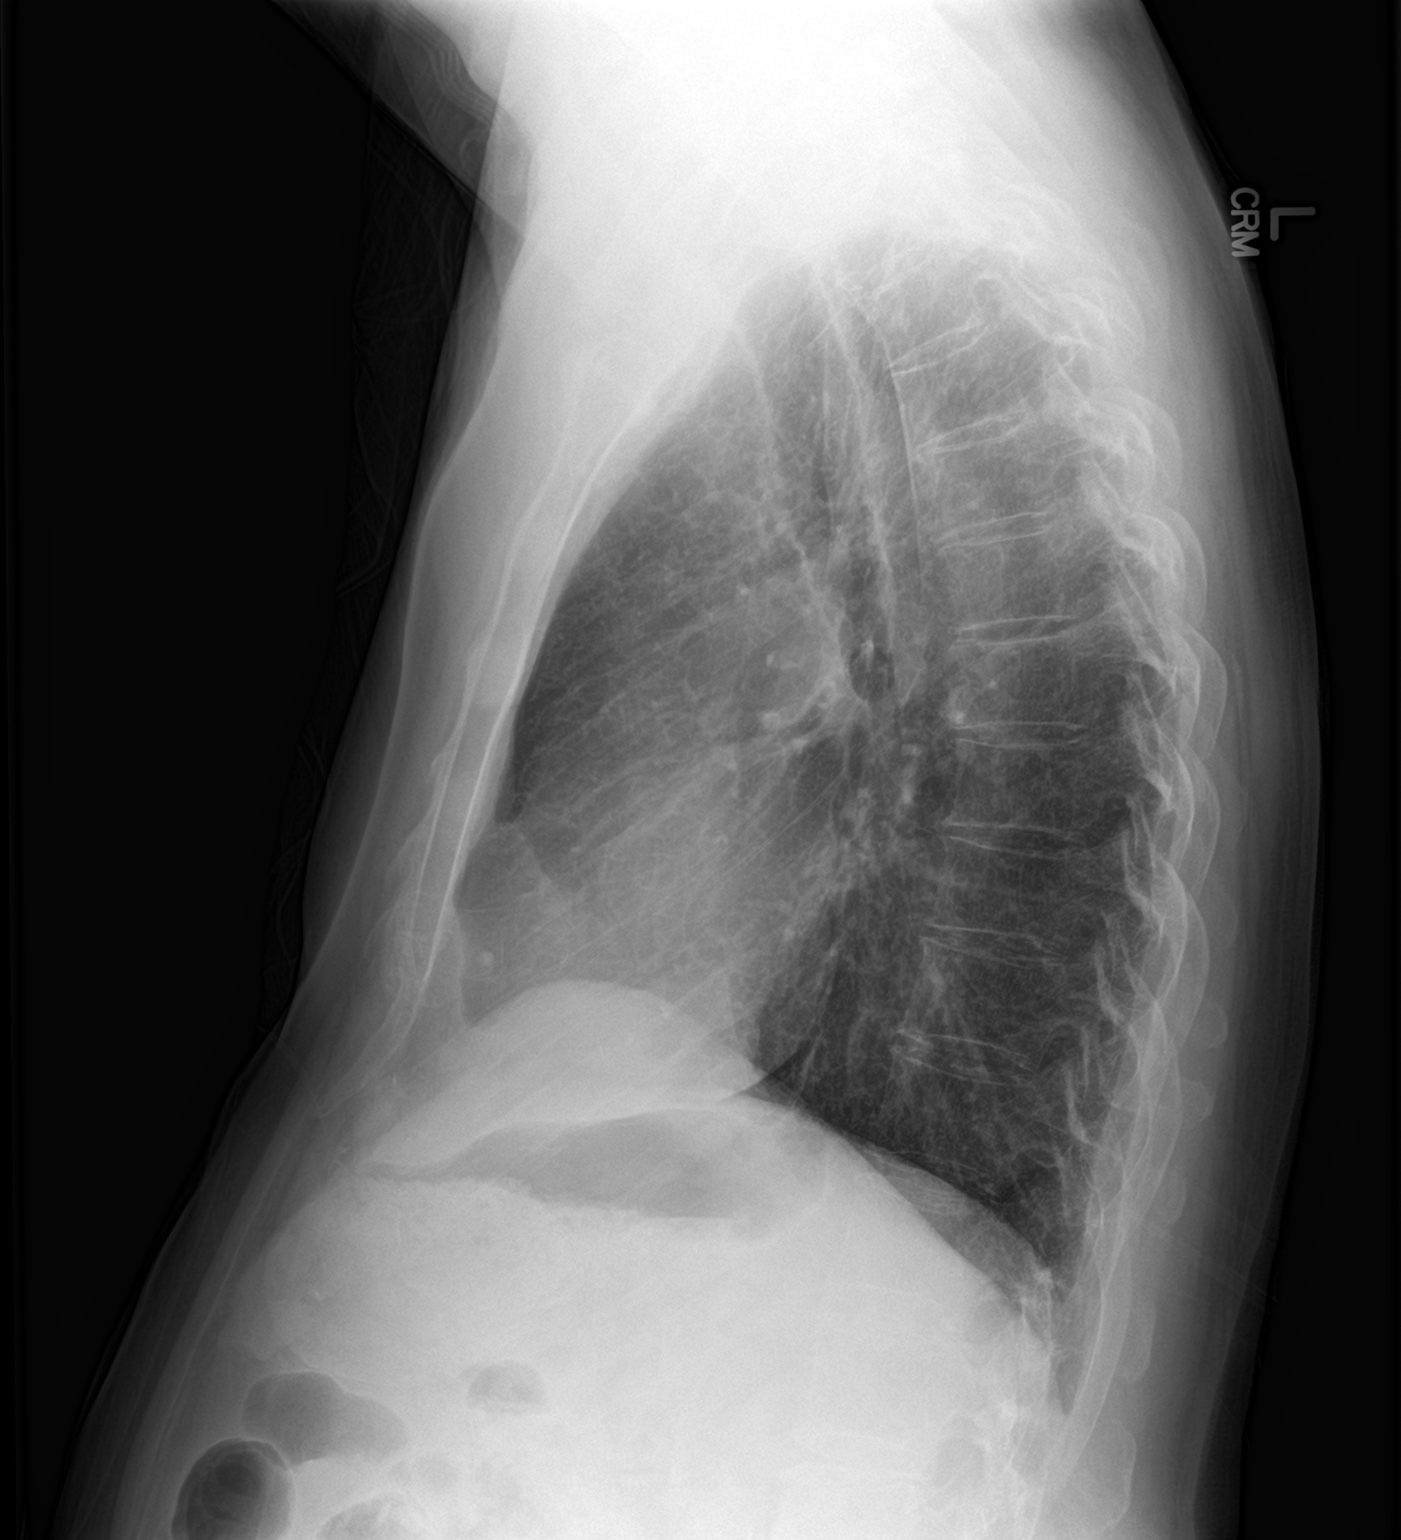

[2 of 2 positions shown; findings below may reference images not displayed]

FINDINGS: The lungs are well-expanded. There is stable biapical pleural
thickening. The interstitial markings are coarse. The heart and
pulmonary vascularity are normal. The mediastinum is normal in
width. There is faint calcification in the wall of the aortic arch.
The bony thorax exhibits no acute abnormality. Old rib deformities
of the seventh and eighth ribs laterally on the left are noted.
IMPRESSION: Chronic bronchitic changes, stable. No pneumonia, CHF, nor other
acute cardiopulmonary abnormality.

Thoracic aortic atherosclerosis.

## 2019-01-27 ENCOUNTER — Other Ambulatory Visit: Payer: Self-pay | Admitting: Internal Medicine

## 2019-01-27 DIAGNOSIS — I10 Essential (primary) hypertension: Secondary | ICD-10-CM

## 2019-01-27 DIAGNOSIS — I209 Angina pectoris, unspecified: Secondary | ICD-10-CM

## 2019-01-27 NOTE — Telephone Encounter (Signed)
Next appt scheduled 6/18 with PCP.

## 2019-01-28 ENCOUNTER — Other Ambulatory Visit: Payer: Self-pay | Admitting: *Deleted

## 2019-01-28 DIAGNOSIS — E119 Type 2 diabetes mellitus without complications: Secondary | ICD-10-CM

## 2019-01-28 DIAGNOSIS — K219 Gastro-esophageal reflux disease without esophagitis: Secondary | ICD-10-CM

## 2019-01-28 MED ORDER — METFORMIN HCL 500 MG PO TABS: ORAL_TABLET | ORAL | 1 refills | Status: DC

## 2019-01-28 MED ORDER — OMEPRAZOLE 20 MG PO CPDR: DELAYED_RELEASE_CAPSULE | ORAL | 0 refills | Status: DC

## 2019-02-05 ENCOUNTER — Other Ambulatory Visit: Payer: Self-pay

## 2019-02-05 ENCOUNTER — Encounter: Payer: Self-pay | Admitting: Internal Medicine

## 2019-02-05 ENCOUNTER — Ambulatory Visit (INDEPENDENT_AMBULATORY_CARE_PROVIDER_SITE_OTHER): Payer: Medicare Other | Admitting: Internal Medicine

## 2019-02-05 VITALS — BP 110/64 | HR 70 | Temp 98.4°F | Wt 167.5 lb

## 2019-02-05 DIAGNOSIS — E1142 Type 2 diabetes mellitus with diabetic polyneuropathy: Secondary | ICD-10-CM | POA: Diagnosis not present

## 2019-02-05 DIAGNOSIS — Z79899 Other long term (current) drug therapy: Secondary | ICD-10-CM

## 2019-02-05 DIAGNOSIS — G629 Polyneuropathy, unspecified: Secondary | ICD-10-CM | POA: Insufficient documentation

## 2019-02-05 DIAGNOSIS — G6289 Other specified polyneuropathies: Secondary | ICD-10-CM

## 2019-02-05 DIAGNOSIS — Z9181 History of falling: Secondary | ICD-10-CM | POA: Diagnosis not present

## 2019-02-05 MED ORDER — DULOXETINE HCL 30 MG PO CPEP: 30.0000 mg | ORAL_CAPSULE | Freq: Every day | ORAL | 1 refills | Status: DC

## 2019-02-05 NOTE — Patient Instructions (Signed)
Gracias por permitirme brindarle su atencin. Hoy estamos revisando algunos anlisis de [place] y comenzando un medicamento llamado Duloxetina. Avseme si esto no le ayuda a Education officer, environmental. Te ver de vuelta en 3 meses.  Thank you for allowing me to provide your care. Today we are checking some blood work and starting a medication called Duloxetine. Please let me know if this does not help you pain. I will see you back in 3 months.
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
## 2019-02-06 LAB — HIV ANTIBODY (ROUTINE TESTING W REFLEX): HIV Screen 4th Generation wRfx: NONREACTIVE

## 2019-02-06 NOTE — Assessment & Plan Note (Addendum)
Patient has experienced burning/pain in his bilateral feet and hands for 1.5-2 years. States that the pain originally started in his hands and has progressed to include his wrists. The burning/pain in his feet started ~6-12 months ago. He has not tried anything for the pain. He had Lyrica in the past but is unsure if this helped. He denies changes in strength or falls. He denies the use of EtOH or tobacco. He is not sexually active. He eats a lot of frozen dinners.   Pure sensory distal polyneuropathy. B12 normal. HIV negative. TSH normal. Previous BMP one month ago was normal. Does have a history of DM which is likely the cause however presentation is not textbook. Would consider IFE and kappa/lambda at next visit.   Started on Duloxetine for symptoms. Avoiding gabapentin and pregabalin due to history of frequent falls.

## 2019-02-07 LAB — CBC WITH DIFFERENTIAL/PLATELET

## 2019-02-11 LAB — CBC WITH DIFFERENTIAL/PLATELET
Basophils Absolute: 0.1 10*3/uL (ref 0.0–0.2)
Basos: 1 %
EOS (ABSOLUTE): 0.3 10*3/uL (ref 0.0–0.4)
Eos: 4 %
Hematocrit: 38 % (ref 37.5–51.0)
Hemoglobin: 13 g/dL (ref 13.0–17.7)
Lymphocytes Absolute: 2.5 10*3/uL (ref 0.7–3.1)
Lymphs: 38 %
MCH: 30.7 pg (ref 26.6–33.0)
MCHC: 34.2 g/dL (ref 31.5–35.7)
MCV: 90 fL (ref 79–97)
Monocytes Absolute: 0.8 10*3/uL (ref 0.1–0.9)
Monocytes: 13 %
Neutrophils Absolute: 2.7 10*3/uL (ref 1.4–7.0)
Neutrophils: 42 %
Platelets: 309 10*3/uL (ref 150–450)
RBC: 4.24 x10E6/uL (ref 4.14–5.80)
RDW: 13.5 % (ref 11.6–15.4)
WBC: 6.5 10*3/uL (ref 3.4–10.8)

## 2019-02-11 LAB — VITAMIN B12: Vitamin B-12: 345 pg/mL (ref 232–1245)

## 2019-02-11 LAB — IMMATURE CELLS: Metamyelocytes: 2 % — ABNORMAL HIGH (ref 0–0)

## 2019-02-11 LAB — TSH: TSH: 1.85 u[IU]/mL (ref 0.450–4.500)

## 2019-02-23 NOTE — Progress Notes (Signed)
Internal Medicine Clinic Attending  Case discussed with Dr. Helberg at the time of the visit.  We reviewed the resident's history and exam and pertinent patient test results.  I agree with the assessment, diagnosis, and plan of care documented in the resident's note.    

## 2019-04-29 ENCOUNTER — Other Ambulatory Visit: Payer: Self-pay | Admitting: *Deleted

## 2019-04-29 DIAGNOSIS — K219 Gastro-esophageal reflux disease without esophagitis: Secondary | ICD-10-CM

## 2019-04-29 MED ORDER — OMEPRAZOLE 20 MG PO CPDR: DELAYED_RELEASE_CAPSULE | ORAL | 0 refills | Status: DC

## 2019-05-21 ENCOUNTER — Encounter: Payer: Medicare Other | Admitting: Internal Medicine

## 2019-05-21 ENCOUNTER — Encounter: Payer: Self-pay | Admitting: Internal Medicine

## 2019-05-28 ENCOUNTER — Encounter: Payer: Self-pay | Admitting: Internal Medicine

## 2019-05-28 ENCOUNTER — Other Ambulatory Visit: Payer: Self-pay

## 2019-05-28 ENCOUNTER — Ambulatory Visit (INDEPENDENT_AMBULATORY_CARE_PROVIDER_SITE_OTHER): Payer: Medicare Other | Admitting: Internal Medicine

## 2019-05-28 VITALS — BP 114/61 | HR 69 | Temp 98.6°F | Ht 65.25 in | Wt 162.6 lb

## 2019-05-28 DIAGNOSIS — I10 Essential (primary) hypertension: Secondary | ICD-10-CM

## 2019-05-28 DIAGNOSIS — N401 Enlarged prostate with lower urinary tract symptoms: Secondary | ICD-10-CM

## 2019-05-28 DIAGNOSIS — Z79899 Other long term (current) drug therapy: Secondary | ICD-10-CM

## 2019-05-28 DIAGNOSIS — Z7984 Long term (current) use of oral hypoglycemic drugs: Secondary | ICD-10-CM

## 2019-05-28 DIAGNOSIS — Z23 Encounter for immunization: Secondary | ICD-10-CM | POA: Diagnosis not present

## 2019-05-28 DIAGNOSIS — G6289 Other specified polyneuropathies: Secondary | ICD-10-CM

## 2019-05-28 DIAGNOSIS — T7840XA Allergy, unspecified, initial encounter: Secondary | ICD-10-CM

## 2019-05-28 DIAGNOSIS — I209 Angina pectoris, unspecified: Secondary | ICD-10-CM

## 2019-05-28 DIAGNOSIS — M48062 Spinal stenosis, lumbar region with neurogenic claudication: Secondary | ICD-10-CM

## 2019-05-28 DIAGNOSIS — E1142 Type 2 diabetes mellitus with diabetic polyneuropathy: Secondary | ICD-10-CM | POA: Diagnosis not present

## 2019-05-28 DIAGNOSIS — E119 Type 2 diabetes mellitus without complications: Secondary | ICD-10-CM | POA: Diagnosis not present

## 2019-05-28 LAB — POCT GLYCOSYLATED HEMOGLOBIN (HGB A1C): Hemoglobin A1C: 6.4 % — AB (ref 4.0–5.6)

## 2019-05-28 LAB — GLUCOSE, CAPILLARY: Glucose-Capillary: 105 mg/dL — ABNORMAL HIGH (ref 70–99)

## 2019-05-28 MED ORDER — FLUTICASONE PROPIONATE 50 MCG/ACT NA SUSP: 1.0000 | Freq: Every day | NASAL | 2 refills | Status: DC

## 2019-05-28 MED ORDER — TAMSULOSIN HCL 0.4 MG PO CAPS: ORAL_CAPSULE | ORAL | 3 refills | Status: DC

## 2019-05-28 MED ORDER — METFORMIN HCL 500 MG PO TABS: ORAL_TABLET | ORAL | 1 refills | Status: DC

## 2019-05-28 MED ORDER — LISINOPRIL 40 MG PO TABS: ORAL_TABLET | ORAL | 3 refills | Status: DC

## 2019-05-28 MED ORDER — DULOXETINE HCL 60 MG PO CPEP: 60.0000 mg | ORAL_CAPSULE | Freq: Every day | ORAL | 0 refills | Status: DC

## 2019-05-28 MED ORDER — PREGABALIN 25 MG PO CAPS: 25.0000 mg | ORAL_CAPSULE | Freq: Three times a day (TID) | ORAL | 0 refills | Status: DC

## 2019-05-28 MED ORDER — ATORVASTATIN CALCIUM 80 MG PO TABS: ORAL_TABLET | ORAL | 3 refills | Status: DC

## 2019-05-28 MED ORDER — AMLODIPINE BESYLATE 5 MG PO TABS: 5.0000 mg | ORAL_TABLET | Freq: Every day | ORAL | 3 refills | Status: DC

## 2019-05-28 NOTE — Patient Instructions (Addendum)
Gracias por permitirme brindarle atencin. Hoy estamos aumentando su Duloxetina para ayudar con Psychiatric nurse. Tambin lo remitiremos a Teaching laboratory technician en manos para ver si puede obtener algn [employer].  Sigan con el Alene Mires con su salud!  Me gustara volver a verte en 6 meses o antes si tienes algn problema. ___________________________________________________________  Thank you for allowing me to provide your care. Today we are increasing your Duloxetine to help with your pain. We are also going to refer you to a hand specialist to see if you can get some additional relief.   Keep up the great work with your health!  I would like to see you back in 6 months or sooner if you have any issues.
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
## 2019-05-29 NOTE — Assessment & Plan Note (Signed)
Patient with significant peripheral neuropathy felt to be secondary from his diabetes. He is currently on Lyrica 25 mg TID and duloxetine 30 mg once daily. At his last visit we had the duloxetine and he states that his pain is significantly improved. The pain in his feet is now minimal. He is wondering if he can go up on duloxetine because he still has some residual pain in his hands. We will increase his duloxetine 60 mg once daily.Marland Kitchen

## 2019-05-29 NOTE — Assessment & Plan Note (Signed)
Patient with well-controlled diabetes. Currently on metformin 500 mg BID. He recently moved in with his son and states that his diet has significantly improved. He is trying to exercise more. He has had no signs or symptoms of hypoglycemia. His hemoglobin A1c today is well-controlled at 6.4. We will continue his metformin as prescribed.

## 2019-05-29 NOTE — Progress Notes (Signed)
Internal Medicine Clinic Attending  Case discussed with Dr. Helberg at the time of the visit.  We reviewed the resident's history and exam and pertinent patient test results.  I agree with the assessment, diagnosis, and plan of care documented in the resident's note.    

## 2019-05-29 NOTE — Assessment & Plan Note (Signed)
Well controlled HTN. Currently on amlodipine 5mg  QD, metoprolol tartrate 50 mg daily, and lisinopril 40 mg daily. He is not having any issues affording these medications and is not having any apparent side effects. Denies orthostatic symptoms. BMP checked earlier this year with normal renal function and potassium. Continue current medications.

## 2019-05-29 NOTE — Assessment & Plan Note (Signed)
Refill the patient's atorvastatin.

## 2019-07-20 ENCOUNTER — Other Ambulatory Visit: Payer: Self-pay

## 2019-07-20 DIAGNOSIS — K219 Gastro-esophageal reflux disease without esophagitis: Secondary | ICD-10-CM

## 2019-07-21 MED ORDER — OMEPRAZOLE 20 MG PO CPDR: DELAYED_RELEASE_CAPSULE | ORAL | 0 refills | Status: DC

## 2019-07-25 ENCOUNTER — Other Ambulatory Visit: Payer: Self-pay

## 2019-07-25 DIAGNOSIS — Z20822 Contact with and (suspected) exposure to covid-19: Secondary | ICD-10-CM

## 2019-07-28 LAB — NOVEL CORONAVIRUS, NAA: SARS-CoV-2, NAA: NOT DETECTED

## 2019-08-02 ENCOUNTER — Other Ambulatory Visit: Payer: Self-pay | Admitting: Internal Medicine

## 2019-08-02 DIAGNOSIS — T7840XA Allergy, unspecified, initial encounter: Secondary | ICD-10-CM

## 2019-08-17 ENCOUNTER — Ambulatory Visit (INDEPENDENT_AMBULATORY_CARE_PROVIDER_SITE_OTHER): Payer: Medicare Other | Admitting: Internal Medicine

## 2019-08-17 VITALS — BP 129/74 | HR 106 | Temp 98.1°F | Ht 66.0 in | Wt 154.6 lb

## 2019-08-17 DIAGNOSIS — R509 Fever, unspecified: Secondary | ICD-10-CM | POA: Diagnosis not present

## 2019-08-17 NOTE — Patient Instructions (Signed)
[name], es probable que tenga una enfermedad viral, posiblemente covid-19. Le he dado informacin para ayudarlo a llamar y programar una cita para hacerse la prueba. Mientras tanto, contine aislndose y si su dificultad para respirar empeora, busque ayuda en el departamento de emergencias de [employer].
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
## 2019-08-18 ENCOUNTER — Encounter: Payer: Self-pay | Admitting: Internal Medicine

## 2019-08-18 NOTE — Assessment & Plan Note (Addendum)
10 days of constitutional symptoms cough, some achiness of knees and muscles in his legs.  He had a very mild dyspnea with exertion compared to his baseline.  He reports some initial fevers of 103 skin thermometer and chills towards the beginning of feeling poorly.  He has had no sick contacts he lives by himself, he has made no visits over the holidays.  His granddaughter buys his groceries for him, to his knowledge she has not been sick.  He was vaccinated for flu this year.   No skin changes, no N/V or worsening diarrhea, no chest pain.  He had a test done earlier this month when he was asymptomatic which returned negative. His fever and chills have resolved.  Overall he feels better than around 10 days ago but main complaint is decreased appetite and achiness, he is tolerating fluids well.  He denies anosmia or dysgeusia.  He seems to be suffering from febrile illness which is likely self limited and seems to be through the worst of it.  This could represent covid-19 or some other virus given his symptoms.  He was given verbal and written instructions on what to do if symptoms worsen and or he begins to develop increased shortness of breath.    -will arrange for testing at [facility], he was given information and confirmed understanding with interpreter -advised to continue quarantining as he is already doing and to expect appetite and achiness to improve with time

## 2019-08-18 NOTE — Progress Notes (Signed)
Internal Medicine Clinic Attending  Case discussed with Dr. Winfrey  at the time of the visit.  We reviewed the resident's history and exam and pertinent patient test results.  I agree with the assessment, diagnosis, and plan of care documented in the resident's note.  

## 2019-08-20 ENCOUNTER — Ambulatory Visit: Payer: Medicare Other | Attending: Internal Medicine

## 2019-08-20 DIAGNOSIS — Z20822 Contact with and (suspected) exposure to covid-19: Secondary | ICD-10-CM

## 2019-08-22 LAB — NOVEL CORONAVIRUS, NAA: SARS-CoV-2, NAA: NOT DETECTED

## 2019-09-15 ENCOUNTER — Other Ambulatory Visit: Payer: Self-pay | Admitting: Internal Medicine

## 2019-09-15 DIAGNOSIS — M48062 Spinal stenosis, lumbar region with neurogenic claudication: Secondary | ICD-10-CM

## 2019-09-22 ENCOUNTER — Other Ambulatory Visit: Payer: Self-pay | Admitting: Internal Medicine

## 2019-09-22 DIAGNOSIS — E119 Type 2 diabetes mellitus without complications: Secondary | ICD-10-CM

## 2019-09-22 NOTE — Telephone Encounter (Signed)
Refill Request  glucose blood (ONETOUCH VERIO) test strip  glucose blood (ONETOUCH VERIO) test stripglucose blood (ONETOUCH VERIO) test strip  ONETOUCH DELICA LANCETS FINE MISC  [id number] - [place], [place] - [address]

## 2019-09-23 ENCOUNTER — Other Ambulatory Visit: Payer: Self-pay | Admitting: *Deleted

## 2019-09-23 DIAGNOSIS — E119 Type 2 diabetes mellitus without complications: Secondary | ICD-10-CM

## 2019-09-23 MED ORDER — ONETOUCH VERIO VI STRP: ORAL_STRIP | 5 refills | Status: DC

## 2019-09-25 MED ORDER — ONETOUCH DELICA LANCETS 33G MISC: 1 refills | Status: DC

## 2019-09-27 ENCOUNTER — Other Ambulatory Visit: Payer: Self-pay | Admitting: Internal Medicine

## 2019-09-27 DIAGNOSIS — E119 Type 2 diabetes mellitus without complications: Secondary | ICD-10-CM

## 2019-10-05 ENCOUNTER — Ambulatory Visit: Payer: Medicare Other | Attending: Internal Medicine

## 2019-10-06 ENCOUNTER — Other Ambulatory Visit: Payer: Self-pay | Admitting: Internal Medicine

## 2019-10-06 DIAGNOSIS — K219 Gastro-esophageal reflux disease without esophagitis: Secondary | ICD-10-CM

## 2019-11-09 ENCOUNTER — Other Ambulatory Visit: Payer: Self-pay | Admitting: Internal Medicine

## 2019-11-09 DIAGNOSIS — E119 Type 2 diabetes mellitus without complications: Secondary | ICD-10-CM

## 2019-11-18 ENCOUNTER — Other Ambulatory Visit: Payer: Self-pay | Admitting: Internal Medicine

## 2019-11-18 DIAGNOSIS — G6289 Other specified polyneuropathies: Secondary | ICD-10-CM

## 2019-12-21 ENCOUNTER — Other Ambulatory Visit: Payer: Self-pay | Admitting: Internal Medicine

## 2019-12-21 DIAGNOSIS — K219 Gastro-esophageal reflux disease without esophagitis: Secondary | ICD-10-CM

## 2019-12-22 NOTE — Telephone Encounter (Signed)
Refill Request     omeprazole (PRILOSEC) 20 MG capsule   fluticasone (FLONASE) 50 MCG/ACT nasal spray  glucose blood (ONETOUCH VERIO) test strip  [id number] - [place], [place] - [address] AT [place]

## 2019-12-24 ENCOUNTER — Other Ambulatory Visit: Payer: Self-pay | Admitting: Internal Medicine

## 2019-12-24 ENCOUNTER — Encounter: Payer: Self-pay | Admitting: *Deleted

## 2019-12-24 DIAGNOSIS — T7840XA Allergy, unspecified, initial encounter: Secondary | ICD-10-CM

## 2020-03-25 ENCOUNTER — Encounter: Payer: Self-pay | Admitting: Internal Medicine

## 2020-03-25 ENCOUNTER — Ambulatory Visit (INDEPENDENT_AMBULATORY_CARE_PROVIDER_SITE_OTHER): Payer: Medicare Other | Admitting: Internal Medicine

## 2020-03-25 ENCOUNTER — Other Ambulatory Visit: Payer: Self-pay

## 2020-03-25 VITALS — BP 126/72 | HR 87 | Temp 98.4°F | Wt 163.9 lb

## 2020-03-25 DIAGNOSIS — D509 Iron deficiency anemia, unspecified: Secondary | ICD-10-CM | POA: Diagnosis not present

## 2020-03-25 DIAGNOSIS — K529 Noninfective gastroenteritis and colitis, unspecified: Secondary | ICD-10-CM

## 2020-03-25 DIAGNOSIS — M48062 Spinal stenosis, lumbar region with neurogenic claudication: Secondary | ICD-10-CM

## 2020-03-25 DIAGNOSIS — I10 Essential (primary) hypertension: Secondary | ICD-10-CM

## 2020-03-25 DIAGNOSIS — E119 Type 2 diabetes mellitus without complications: Secondary | ICD-10-CM | POA: Diagnosis not present

## 2020-03-25 DIAGNOSIS — K219 Gastro-esophageal reflux disease without esophagitis: Secondary | ICD-10-CM

## 2020-03-25 LAB — POCT GLYCOSYLATED HEMOGLOBIN (HGB A1C): Hemoglobin A1C: 6 % — AB (ref 4.0–5.6)

## 2020-03-25 LAB — GLUCOSE, CAPILLARY: Glucose-Capillary: 118 mg/dL — ABNORMAL HIGH (ref 70–99)

## 2020-03-25 MED ORDER — LOPERAMIDE HCL 2 MG PO TABS: 2.0000 mg | ORAL_TABLET | Freq: Four times a day (QID) | ORAL | 1 refills | Status: DC | PRN

## 2020-03-25 MED ORDER — CARVEDILOL 3.125 MG PO TABS: 3.1250 mg | ORAL_TABLET | Freq: Two times a day (BID) | ORAL | 1 refills | Status: DC

## 2020-03-25 MED ORDER — OMEPRAZOLE 20 MG PO CPDR: 20.0000 mg | DELAYED_RELEASE_CAPSULE | Freq: Every day | ORAL | 3 refills | Status: DC

## 2020-03-25 MED ORDER — PREGABALIN 25 MG PO CAPS: 25.0000 mg | ORAL_CAPSULE | Freq: Three times a day (TID) | ORAL | 0 refills | Status: DC
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
## 2020-03-25 NOTE — Patient Instructions (Addendum)
¡  Fue un placer verte hoy! Gracias por elegir [facility] para su atencin primaria.   Hoy hablamos de:  Diabetes: Lo est haciendo [employer]. Hoy no se han realizado cambios.  Presin arterial alta: deje de tomar amlodipino. Empiece a tomar Carvedilol dos veces al da.  Envi un medicamento llamado Imodium para la diarrea. Puede tomar 1 tableta hasta cuatro veces al [employer]. Evite usar ms de 1-2 por semana.

## 2020-03-26 LAB — BMP8+ANION GAP
Anion Gap: 17 mmol/L (ref 10.0–18.0)
BUN/Creatinine Ratio: 15 (ref 10–24)
BUN: 12 mg/dL (ref 8–27)
CO2: 22 mmol/L (ref 20–29)
Calcium: 9.1 mg/dL (ref 8.6–10.2)
Chloride: 90 mmol/L — ABNORMAL LOW (ref 96–106)
Creatinine, Ser: 0.78 mg/dL (ref 0.76–1.27)
GFR calc Af Amer: 102 mL/min/{1.73_m2} (ref >59)
GFR calc non Af Amer: 88 mL/min/{1.73_m2} (ref >59)
Glucose: 91 mg/dL (ref 65–99)
Potassium: 4.6 mmol/L (ref 3.5–5.2)
Sodium: 129 mmol/L — ABNORMAL LOW (ref 134–144)

## 2020-03-26 LAB — MICROALBUMIN / CREATININE URINE RATIO
Creatinine, Urine: 96.1 mg/dL
Microalb/Creat Ratio: <3 mg/g creat (ref 0–29)
Microalbumin, Urine: <3 ug/mL

## 2020-03-26 LAB — CBC
Hematocrit: 38.2 % (ref 37.5–51.0)
Hemoglobin: 13.1 g/dL (ref 13.0–17.7)
MCH: 30 pg (ref 26.6–33.0)
MCHC: 34.3 g/dL (ref 31.5–35.7)
MCV: 88 fL (ref 79–97)
Platelets: 257 10*3/uL (ref 150–450)
RBC: 4.36 x10E6/uL (ref 4.14–5.80)
RDW: 13.2 % (ref 11.6–15.4)
WBC: 6.5 10*3/uL (ref 3.4–10.8)

## 2020-03-28 DIAGNOSIS — K529 Noninfective gastroenteritis and colitis, unspecified: Secondary | ICD-10-CM | POA: Insufficient documentation

## 2020-03-28 NOTE — Assessment & Plan Note (Signed)
Patient denies any shortness of breath, recent sources of bleeding.  Assessment/plan: CBC obtained and shows normalization of hemoglobin.  We will discontinued additional monitoring unless patient should develop symptoms or acute bleeding.

## 2020-03-28 NOTE — Assessment & Plan Note (Signed)
Stable at this time.  No current symptoms of heartburn.  Assessment/plan: -Refilled omeprazole

## 2020-03-28 NOTE — Assessment & Plan Note (Signed)
Lab Results  Component Value Date   HGBA1C 6.0 (A) 03/25/2020   [name] denies any current difficulty with his diabetes regimen which includes only Metformin and Lyrica for neuropathy.  He takes his CBG regularly and noise within normal limits.  Assessment/plan: Very well controlled diabetes.  No medication changes planned at this time.  Next A1c in 6 months.  -Continue Metformin 500 mg twice daily

## 2020-03-28 NOTE — Assessment & Plan Note (Signed)
BP: 126/72   Patient states he is currently taking amlodipine and lisinopril daily.  His only complaint at this time is that he has developed several month history of bilateral lower extremity edema that is nonpainful, nonerythematous but frustrating.  He denies any changes in his daily salt intake, noting that he is on a healthy low-salt diet.  He denies any other chest pain, shortness of breath, orthopnea.  No known history of heart failure.  Assessment/plan: Blood pressure is currently well controlled, however patient has likely developed lower extremity edema from amlodipine.  Will discontinue amlodipine at this time.  He will likely need an additional agent to cover for this.  Per chart review at one point patient was on metoprolol and he is no longer taking it; he does not know why it was discontinued.  There is no documentation of adverse effects from the metoprolol or lack of tolerance.  For this reason, we will add back on a beta-blocker.  BMP obtained no evidence of kidney disease.  -Continue lisinopril 40 mg daily -Discontinue amlodipine -Start carvedilol 3.125 mg twice daily -4-week follow-up

## 2020-03-28 NOTE — Assessment & Plan Note (Signed)
Patient feels that his neuropathy is well controlled with Lyrica.  Assessment/plan: -Norco refilled at this time

## 2020-03-28 NOTE — Assessment & Plan Note (Signed)
Patient endorses a several year history of chronic diarrhea that is intermittent, nonmelanotic and nonbloody.  He states that he has episodes approximately 2 to 3 days/week.  When he is having diarrhea, he denies any abdominal pain, nausea, vomiting, pain with defecation.  He has not tried any over-the-counter medications for this as he was unaware anything was available.  Assessment/plan: IBD and infectious causes are very low on the differential given chronic history and its intermittent presents without any abdominal pain.  We will treat symptomatically at this time with as needed Imodium.

## 2020-03-30 ENCOUNTER — Other Ambulatory Visit: Payer: Self-pay

## 2020-03-30 DIAGNOSIS — E119 Type 2 diabetes mellitus without complications: Secondary | ICD-10-CM

## 2020-03-30 MED ORDER — ONETOUCH VERIO VI STRP: ORAL_STRIP | 5 refills | Status: DC

## 2020-03-30 NOTE — Telephone Encounter (Signed)
Returned call to patient's son. No answer. Left message on VM requesting return call. Hubbard Hartshorn, RN, BSN

## 2020-03-30 NOTE — Telephone Encounter (Signed)
Pt's son requesting to speak with a nurse about meds, please call back.

## 2020-03-30 NOTE — Telephone Encounter (Signed)
Pt son is calling back; pls return call

## 2020-03-30 NOTE — Telephone Encounter (Signed)
Returned call to son. States patient [facility] [address]. Hubbard Hartshorn, BSN, RN-BC

## 2020-04-01 NOTE — Progress Notes (Signed)
Internal Medicine Clinic Attending  Case discussed with Dr. Basaraba  At the time of the visit.  We reviewed the resident's history and exam and pertinent patient test results.  I agree with the assessment, diagnosis, and plan of care documented in the resident's note.  

## 2020-04-07 ENCOUNTER — Other Ambulatory Visit: Payer: Self-pay | Admitting: *Deleted

## 2020-04-07 DIAGNOSIS — T7840XA Allergy, unspecified, initial encounter: Secondary | ICD-10-CM

## 2020-04-07 MED ORDER — FLUTICASONE PROPIONATE 50 MCG/ACT NA SUSP: 1.0000 | Freq: Every day | NASAL | 2 refills | Status: DC

## 2020-04-23 ENCOUNTER — Other Ambulatory Visit: Payer: Self-pay | Admitting: Internal Medicine

## 2020-04-23 DIAGNOSIS — E119 Type 2 diabetes mellitus without complications: Secondary | ICD-10-CM

## 2020-04-27 ENCOUNTER — Other Ambulatory Visit: Payer: Self-pay | Admitting: *Deleted

## 2020-04-27 DIAGNOSIS — N401 Enlarged prostate with lower urinary tract symptoms: Secondary | ICD-10-CM

## 2020-04-27 DIAGNOSIS — I209 Angina pectoris, unspecified: Secondary | ICD-10-CM

## 2020-04-27 DIAGNOSIS — I10 Essential (primary) hypertension: Secondary | ICD-10-CM

## 2020-04-27 DIAGNOSIS — E119 Type 2 diabetes mellitus without complications: Secondary | ICD-10-CM

## 2020-04-27 MED ORDER — LISINOPRIL 40 MG PO TABS: ORAL_TABLET | ORAL | 3 refills | Status: DC

## 2020-04-27 MED ORDER — AMLODIPINE BESYLATE 5 MG PO TABS: 5.0000 mg | ORAL_TABLET | Freq: Every day | ORAL | 3 refills | Status: DC

## 2020-04-27 MED ORDER — ATORVASTATIN CALCIUM 80 MG PO TABS: ORAL_TABLET | ORAL | 3 refills | Status: DC

## 2020-04-27 MED ORDER — TAMSULOSIN HCL 0.4 MG PO CAPS: ORAL_CAPSULE | ORAL | 3 refills | Status: DC

## 2020-07-03 ENCOUNTER — Other Ambulatory Visit: Payer: Self-pay | Admitting: Internal Medicine

## 2020-07-03 DIAGNOSIS — M48062 Spinal stenosis, lumbar region with neurogenic claudication: Secondary | ICD-10-CM

## 2020-09-22 ENCOUNTER — Other Ambulatory Visit: Payer: Self-pay | Admitting: Internal Medicine

## 2020-09-22 DIAGNOSIS — E119 Type 2 diabetes mellitus without complications: Secondary | ICD-10-CM

## 2020-10-14 ENCOUNTER — Telehealth: Payer: Self-pay | Admitting: *Deleted

## 2020-10-14 NOTE — Telephone Encounter (Signed)
Call from pt's granddaughter- she received a call from pt stating his lips are swollen; since yesterday. I asked if pt has eaten something different or taken a new medication - she stated no. She stated according to pt, no difficulty breathing. Only pt stated his lips are more swollen today (she does not live with pt). I asked if pt tried taking Benadryl - she stated no. No appts available this morning. Instructed to take pt to UC this morning.Voiced understanding.

## 2020-10-17 NOTE — Telephone Encounter (Signed)
F/U Call - I called the granddaughter to see how pt 's doing - no answer and unable to leave message "Mailbox is full".

## 2020-10-19 ENCOUNTER — Other Ambulatory Visit: Payer: Self-pay | Admitting: Internal Medicine

## 2020-10-19 DIAGNOSIS — E119 Type 2 diabetes mellitus without complications: Secondary | ICD-10-CM

## 2021-02-21 ENCOUNTER — Encounter: Payer: Self-pay | Admitting: *Deleted

## 2021-02-24 ENCOUNTER — Other Ambulatory Visit: Payer: Self-pay | Admitting: Student

## 2021-02-24 DIAGNOSIS — T7840XA Allergy, unspecified, initial encounter: Secondary | ICD-10-CM

## 2021-02-28 ENCOUNTER — Ambulatory Visit (INDEPENDENT_AMBULATORY_CARE_PROVIDER_SITE_OTHER): Payer: Medicare Other | Admitting: Student

## 2021-02-28 ENCOUNTER — Encounter: Payer: Self-pay | Admitting: Student

## 2021-02-28 VITALS — BP 135/77 | HR 101 | Temp 98.6°F | Ht 67.0 in | Wt 161.1 lb

## 2021-02-28 DIAGNOSIS — K529 Noninfective gastroenteritis and colitis, unspecified: Secondary | ICD-10-CM | POA: Diagnosis not present

## 2021-02-28 DIAGNOSIS — Z0000 Encounter for general adult medical examination without abnormal findings: Secondary | ICD-10-CM

## 2021-02-28 DIAGNOSIS — M48062 Spinal stenosis, lumbar region with neurogenic claudication: Secondary | ICD-10-CM

## 2021-02-28 DIAGNOSIS — E119 Type 2 diabetes mellitus without complications: Secondary | ICD-10-CM

## 2021-02-28 DIAGNOSIS — M546 Pain in thoracic spine: Secondary | ICD-10-CM

## 2021-02-28 DIAGNOSIS — M545 Low back pain, unspecified: Secondary | ICD-10-CM

## 2021-02-28 LAB — GLUCOSE, CAPILLARY: Glucose-Capillary: 104 mg/dL — ABNORMAL HIGH (ref 70–99)

## 2021-02-28 LAB — POCT GLYCOSYLATED HEMOGLOBIN (HGB A1C): Hemoglobin A1C: 6.3 % — AB (ref 4.0–5.6)

## 2021-02-28 MED ORDER — LOPERAMIDE HCL 2 MG PO TABS: 2.0000 mg | ORAL_TABLET | Freq: Four times a day (QID) | ORAL | 1 refills | Status: DC | PRN

## 2021-02-28 MED ORDER — OXYCODONE-ACETAMINOPHEN 10-325 MG PO TABS: 1.0000 | ORAL_TABLET | Freq: Four times a day (QID) | ORAL | 0 refills | Status: AC | PRN

## 2021-02-28 MED ORDER — PREGABALIN 25 MG PO CAPS: 25.0000 mg | ORAL_CAPSULE | Freq: Three times a day (TID) | ORAL | 0 refills | Status: DC

## 2021-02-28 MED ORDER — SHINGRIX 50 MCG/0.5ML IM SUSR: 0.5000 mL | Freq: Once | INTRAMUSCULAR | 0 refills | Status: AC

## 2021-02-28 NOTE — Assessment & Plan Note (Signed)
[name] presents today complaining of severe lower back pain.  He has a history of back surgery in 2014 and 2017.  Since then the back pain has been continuous.  He states that he has tried over-the-counter medications with minimal relief.  The pain worsens with standing straight for long periods of time.  Change of position does not decrease the pain.  The pain has gotten so severe which is why the visit was prompted today.  Straight leg test negative on physical exam.  Palpation of the thoracic lumbar spine elicits pain.   PLAN: Prescribed Percocet for 15 days Q6H, max 4 tabs a day. MRI ordered

## 2021-02-28 NOTE — Assessment & Plan Note (Signed)
Hemoglobin A1c was 6.3 today. Patient agreed to get the Shingrix injection. Patient does not wish to have an ophthalmology exam at this time. No blood work needed at this visit.
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
## 2021-02-28 NOTE — Patient Instructions (Signed)
The Percocet you can take every 6 hours a day.  No more than 4 tablets in 1 day.

## 2021-03-02 NOTE — Progress Notes (Signed)
Internal Medicine Clinic Attending  I saw and evaluated the patient.  I personally confirmed the key portions of the history and exam documented by Dr. Ariwodo and I reviewed pertinent patient test results.  The assessment, diagnosis, and plan were formulated together and I agree with the documentation in the resident's note.   

## 2021-03-12 ENCOUNTER — Encounter: Payer: Self-pay | Admitting: *Deleted

## 2021-03-12 NOTE — Progress Notes (Signed)

## 2021-03-13 NOTE — Progress Notes (Signed)
Things That May Be Affecting Your Health:  Alcohol  Hearing loss  Pain   x Depression  Home Safety  Sexual Health   Diabetes  Lack of physical activity  Stress  x Difficulty with daily activities  Loneliness  Tiredness   Drug use  Medicines  Tobacco use  x Falls  Motor Vehicle Safety  Weight   Food choices  Oral Health  Other    YOUR PERSONALIZED HEALTH PLAN : 1. Schedule your next subsequent Medicare Wellness visit in one year 2. Attend all of your regular appointments to address your medical issues 3. Complete the preventative screenings and services   Annual Wellness Visit   Medicare Covered Preventative Screenings and [place] Men and Women Who How Often Need? Date of Last Service Action  Abdominal Aortic Aneurysm Adults with AAA risk factors Once      Alcohol Misuse and Counseling All Adults Screening once a year if no alcohol misuse. Counseling up to 4 face to face sessions.     Bone Density Measurement  Adults at risk for osteoporosis Once every 2 yrs      Lipid Panel Z13.6 All adults without CV disease Once every 5 yrs       Colorectal Cancer  Stool sample or Colonoscopy All adults 31 and older  Once every year Every 10 years        Depression All Adults Once a year X Today   Diabetes Screening Blood glucose, post glucose load, or GTT Z13.1 All adults at risk Pre-diabetics Once per year Twice per year      Diabetes  Self-Management Training All adults Diabetics 10 hrs first year; 2 hours subsequent years. Requires Copay     Glaucoma Diabetics Family history of glaucoma African Americans 31 yrs + Hispanic Americans 51 yrs + Annually - requires coppay      Hepatitis C Z72.89 or F19.20 High Risk for HCV Born between 1945 and 1965 Annually Once      HIV Z11.4 All adults based on risk Annually btw ages 7 & 20 regardless of risk Annually > 65 yrs if at increased risk      Lung Cancer Screening Asymptomatic adults aged 70-77 with 30  pack yr history and current smoker OR quit within the last 15 yrs Annually Must have counseling and shared decision making documentation before first screen      Medical Nutrition Therapy Adults with  Diabetes Renal disease Kidney transplant within past 3 yrs 3 hours first year; 2 hours subsequent years     Obesity and Counseling All adults Screening once a year Counseling if BMI 30 or higher  Today   Tobacco Use Counseling Adults who use tobacco  Up to 8 visits in one year     Vaccines Z23 Hepatitis B Influenza  Pneumonia  Adults  Once Once every flu season Two different vaccines separated by one year  X - Zoster/COVID   Next Annual Wellness Visit People with Medicare Every year  Today     Services & Screenings Women Who How Often Need  Date of Last Service Action  Mammogram  Z12.31 Women over 81 One baseline ages 27-39. Annually ager 40 yrs+      Pap tests All women Annually if high risk. Every 2 yrs for normal risk women      Screening for cervical cancer with  Pap (Z01.419 nl or Z01.411abnl) & HPV Z11.51 Women aged 63 to 63 Once every 5 yrs  Screening pelvic and breast exams All women Annually if high risk. Every 2 yrs for normal risk women     Sexually Transmitted Diseases Chlamydia Gonorrhea Syphilis All at risk adults Annually for non pregnant females at increased risk         [place] Men Who How Ofter Need  Date of Last Service Action  Prostate Cancer - DRE & PSA Men over 50 Annually.  DRE might require a copay.        Sexually Transmitted Diseases Syphilis All at risk adults Annually for men at increased risk      Health Maintenance List Health Maintenance  Topic Date Due   OPHTHALMOLOGY EXAM  Never done   Zoster Vaccines- Shingrix (1 of 2) Never done   COVID-19 Vaccine (3 - Booster for Moderna series) 04/15/2020   INFLUENZA VACCINE  03/20/2021   FOOT EXAM  03/25/2021   HEMOGLOBIN A1C  08/31/2021   TETANUS/TDAP  08/20/2028    Hepatitis C Screening  Completed   PNA vac Low Risk Adult  Completed   HPV VACCINES  Aged Out

## 2021-03-18 ENCOUNTER — Other Ambulatory Visit: Payer: Self-pay | Admitting: Internal Medicine

## 2021-03-18 DIAGNOSIS — I10 Essential (primary) hypertension: Secondary | ICD-10-CM

## 2021-03-21 ENCOUNTER — Ambulatory Visit (HOSPITAL_COMMUNITY): Admission: RE | Admit: 2021-03-21 | Payer: Medicare Other | Source: Ambulatory Visit

## 2021-03-21 ENCOUNTER — Ambulatory Visit (HOSPITAL_COMMUNITY): Payer: Medicare Other

## 2021-03-21 NOTE — Telephone Encounter (Signed)
Per last note from Dr. Charleen Kirks, patient's amlodipine was discontinued as she thought it was contributing to his lower extremity swelling.  I believe patient would benefit from an appointment to further discuss his blood pressure management and lower extremity swelling. It does not appear as though his BP regimen has been discussed since an appointment over a year ago when he was started on a beta blocker.

## 2021-03-29 NOTE — Telephone Encounter (Signed)
Thank you :)

## 2021-03-29 NOTE — Telephone Encounter (Signed)
Per Epic, pt has an appt on 04/06/21.

## 2021-04-06 ENCOUNTER — Other Ambulatory Visit: Payer: Self-pay

## 2021-04-06 ENCOUNTER — Encounter: Payer: Self-pay | Admitting: Student

## 2021-04-06 ENCOUNTER — Ambulatory Visit (INDEPENDENT_AMBULATORY_CARE_PROVIDER_SITE_OTHER): Payer: Medicare Other | Admitting: Student

## 2021-04-06 VITALS — BP 107/58 | HR 87 | Resp 18 | Wt 158.6 lb

## 2021-04-06 DIAGNOSIS — I10 Essential (primary) hypertension: Secondary | ICD-10-CM | POA: Diagnosis not present

## 2021-04-06 DIAGNOSIS — E119 Type 2 diabetes mellitus without complications: Secondary | ICD-10-CM

## 2021-04-06 DIAGNOSIS — M545 Low back pain, unspecified: Secondary | ICD-10-CM | POA: Diagnosis not present

## 2021-04-06 DIAGNOSIS — M546 Pain in thoracic spine: Secondary | ICD-10-CM | POA: Diagnosis not present

## 2021-04-06 MED ORDER — CARVEDILOL 3.125 MG PO TABS: 3.1250 mg | ORAL_TABLET | Freq: Two times a day (BID) | ORAL | 1 refills | Status: DC

## 2021-04-06 NOTE — Assessment & Plan Note (Addendum)
Patient continues to endorse back pain.  Denies red flag symptoms of fever, chills, loss of bowel or bladder function.  Patient had MRI rescheduled, discussed with referral staff, they are working to have this rescheduled.  Addendum:  MRI with evidence of stenosis but no compression. Will reach out to the patient to discuss PT referral. If no relief with this, can consider steroid injections

## 2021-04-06 NOTE — Patient Instructions (Addendum)
Gracias, Mr.Kore Cid-Prado por permitirnos brindarle atencin hoy. hoy discutimos  Presin arterial Su presin arterial estuvo baja hoy, por favor controle su presin arterial en casa y antela. Lleve estas lecturas a su prxima cita, junto con sus medicamentos. Estamos revisando algunos laboratorios y luego ajustaremos algunos de sus medicamentos. Tambin he enviado una referencia a nuestro farmacutico para que lo ayude con sus medicamentos.  Diabetes Contine haciendo ejercicio regularmente y trabaje en su dieta.  Dolor de espalda Lo llamarn para programar una llamada telefnica para la resonancia magntica  He ordenado los siguientes laboratorios para usted:  Lab Orders  No laboratory test(s) ordered today     Pruebas ordenadas hoy:  Referencias ordenadas hoy:  Referral Orders  No referral(s) requested today     He ordenado el siguiente medicamento/cambiado los siguientes medicamentos:  Suspender los siguientes medicamentos: There are no discontinued medications.   Iniciar los siguientes medicamentos: No orders of the defined types were placed in this encounter.    Seguimiento 3 months follow up blood pressure and diabetes    Si tiene alguna pregunta o inquietud, llame a la clnica de medicina interna al [phone].     Sanjuana Letters, D.O. [place]
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
## 2021-04-06 NOTE — Assessment & Plan Note (Addendum)
Assessment: Blood pressure has not been discussed with patient in the past few years.  He is uncertain which medications he is taking.  Medication list with amlodipine 5 mg daily, carvedilol 3.125 twice daily and lisinopril 40 daily.  Blood pressure today 107/58.  Patient states that at times he occasionally can be lightheaded or dizzy when he is out working in the yard.  Because he is uncertain which medications he was taking.  We will check his BMP and microalbumin to creatinine ratio to determine if he can benefit from ACE inhibitors.  With his history of CAD believe he would benefit from a beta-blocker.  Plan: -If no proteinuria can discontinue the lisinopril and continue carvedilol.  -We will wait to discontinue for continue amlodipine based on lab results  Addendum: Cr normal and microalbumin to creatinine unremarkable. Will DC ACE-i and continue patient on carvedilol alone. Discussed with patient's Granddaughter [name]

## 2021-04-07 LAB — BMP8+ANION GAP
Anion Gap: 16 mmol/L (ref 10.0–18.0)
BUN/Creatinine Ratio: 13 (ref 10–24)
BUN: 14 mg/dL (ref 8–27)
CO2: 23 mmol/L (ref 20–29)
Calcium: 9.3 mg/dL (ref 8.6–10.2)
Chloride: 95 mmol/L — ABNORMAL LOW (ref 96–106)
Creatinine, Ser: 1.06 mg/dL (ref 0.76–1.27)
Glucose: 94 mg/dL (ref 65–99)
Potassium: 4.7 mmol/L (ref 3.5–5.2)
Sodium: 134 mmol/L (ref 134–144)
eGFR: 73 mL/min/{1.73_m2} (ref >59)

## 2021-04-07 LAB — MICROALBUMIN / CREATININE URINE RATIO
Creatinine, Urine: 511.1 mg/dL
Microalb/Creat Ratio: 8 mg/g creat (ref 0–29)
Microalbumin, Urine: 39.4 ug/mL

## 2021-04-10 NOTE — Addendum Note (Signed)
Addended by: Riesa Pope on: 04/10/2021 04:51 PM   Modules accepted: Orders

## 2021-04-10 NOTE — Progress Notes (Signed)
Internal Medicine Clinic Attending  Case discussed with Dr. Katsadouros  At the time of the visit.  We reviewed the resident's history and exam and pertinent patient test results.  I agree with the assessment, diagnosis, and plan of care documented in the resident's note.  

## 2021-04-15 ENCOUNTER — Other Ambulatory Visit: Payer: Self-pay | Admitting: Internal Medicine

## 2021-04-15 DIAGNOSIS — E119 Type 2 diabetes mellitus without complications: Secondary | ICD-10-CM

## 2021-04-20 ENCOUNTER — Ambulatory Visit (HOSPITAL_COMMUNITY)
Admission: RE | Admit: 2021-04-20 | Discharge: 2021-04-20 | Disposition: A | Discharge status: Home / Self Care | Payer: Medicare Other | Source: Ambulatory Visit | Attending: Internal Medicine | Admitting: Internal Medicine

## 2021-04-20 ENCOUNTER — Other Ambulatory Visit: Payer: Self-pay

## 2021-04-20 ENCOUNTER — Ambulatory Visit (INDEPENDENT_AMBULATORY_CARE_PROVIDER_SITE_OTHER): Payer: Medicare Other | Admitting: Pharmacist

## 2021-04-20 ENCOUNTER — Ambulatory Visit (HOSPITAL_COMMUNITY): Payer: Medicare Other

## 2021-04-20 DIAGNOSIS — M48062 Spinal stenosis, lumbar region with neurogenic claudication: Secondary | ICD-10-CM | POA: Diagnosis not present

## 2021-04-20 DIAGNOSIS — Z79899 Other long term (current) drug therapy: Secondary | ICD-10-CM | POA: Diagnosis not present

## 2021-04-20 MED ORDER — METOPROLOL SUCCINATE ER 50 MG PO TB24: 50.0000 mg | ORAL_TABLET | Freq: Every day | ORAL | 0 refills | Status: DC

## 2021-04-20 NOTE — Patient Instructions (Signed)
Mr. Holzworth it was a pleasure seeing you today.   Today we reviewed all of the medications you are currently taking. Included is an updated medication list. Please continue taking all medications as prescribed on this list.  If you have any questions please call the clinic and ask to speak with me.  Follow-up with in clinic to have your blood pressure checked.

## 2021-04-20 NOTE — Progress Notes (Signed)
   Subjective:    Patient ID: [name], male    DOB: 11/21/1945, 75 y.o.   MRN: [id number]  HPI  Patient is a 75 y.o. male who presents for medication review and management. He is in good spirits and presents without assistance. Patient's granddaughter is present and interpreted for patient. Patient was referred and last seen by Primary Care Provider on 04/06/21.  Patient reports still having confusion with medication despite Dr. Johnney Ou simplifying regimen at last visit. Patient continues to take lisinopril '40mg'$  and has not been taking his atorvastatin, carvedilol, or amlodipine due to "not liking the atorvastatin" and the other two causing edema.  Medication Adherence Questionnaire (A score of 2 or more points indicates risk for nonadherence)  Do you know what each of your medicines is for? 1 (1 point if no)  Do you ever have trouble remembering to take your medicine? 0 (2 points if yes)  Do you ever not take a medicine because you feel you do not need it?  1 (1 point if yes)  Do you think that any of your medicines is not helping you? 1 (1 point if yes)  Do you have any physical problems such as vision loss that keep you from taking your medicines as prescribed?  0 (2 points if yes)  Do you think any of your medicine is causing a side effect? 0 (1 point if yes)  Do you know the names of ALL of your medicines? 1 (1 point if no)  Do you think that you need ALL of your medicines? 1 (1 point if no)  In the past 6 months, have you missed getting a refill or a new prescription filled on time?  0 (1 point if yes)  How often do you miss taking a dose of medicine?  1 Never (0 points), 1 or 2 times a month (1 points), 1 time a week (2 points), 2 or more times a week (3 points).   TOTAL SCORE 6/14    Objective:   Labs:   Physical Exam Neurological:     Mental Status: He is alert and oriented to person, place, and time.    Review of Systems  Cardiovascular:  Negative for leg  swelling.  Neurological:  Negative for dizziness.   Assessment/Plan:   Understanding of regimen: poor  Understanding of indications: poor  Potential of compliance: fair  Patient has known adherence challenges based on score of 6 for questionnaire. Barriers include: lack of knowledge and lack of belief in necessity of treatment. Medication list reviewed and updated. Patient previously tolerating metoprolol succinate '50mg'$  and was discontinued without any clear cause. Will send in this to replace carvedilol. Per Dr. Johnney Ou' last note and recent labs patient does not need lisinopril. Discussed with patient importance of taking all medications, including atorvastatin. Patient verbalized understanding. Properly disposed of medications patient is no longer taking (amlodipine, lisinopril, carvedilol). Patient was provided with a printed medication list.   Follow-up appointment in one month for BP check. Written patient instructions provided.  This appointment required 30 minutes of direct patient care.  Thank you for involving pharmacy to assist in providing this patient's care.

## 2021-04-26 ENCOUNTER — Telehealth: Payer: Self-pay

## 2021-04-26 NOTE — Telephone Encounter (Signed)
Patient is due for his AWV exam, attempted to call number on file. No answer or voice mail to leave a message.

## 2021-04-27 ENCOUNTER — Other Ambulatory Visit: Payer: Self-pay | Admitting: Internal Medicine

## 2021-04-27 DIAGNOSIS — E119 Type 2 diabetes mellitus without complications: Secondary | ICD-10-CM

## 2021-04-27 DIAGNOSIS — I10 Essential (primary) hypertension: Secondary | ICD-10-CM

## 2021-04-27 DIAGNOSIS — I209 Angina pectoris, unspecified: Secondary | ICD-10-CM

## 2021-04-27 DIAGNOSIS — N401 Enlarged prostate with lower urinary tract symptoms: Secondary | ICD-10-CM

## 2021-05-04 ENCOUNTER — Other Ambulatory Visit: Payer: Self-pay | Admitting: Internal Medicine

## 2021-05-04 DIAGNOSIS — K219 Gastro-esophageal reflux disease without esophagitis: Secondary | ICD-10-CM

## 2021-05-17 ENCOUNTER — Other Ambulatory Visit: Payer: Self-pay | Admitting: Internal Medicine

## 2021-05-18 ENCOUNTER — Ambulatory Visit (INDEPENDENT_AMBULATORY_CARE_PROVIDER_SITE_OTHER): Payer: Medicare Other | Admitting: Pharmacist

## 2021-05-18 ENCOUNTER — Other Ambulatory Visit: Payer: Self-pay

## 2021-05-18 DIAGNOSIS — I10 Essential (primary) hypertension: Secondary | ICD-10-CM

## 2021-05-18 NOTE — Patient Instructions (Addendum)
[name] fue un placer verlo hoy.  Su presin arterial hoy est mejorando.  Contine tomando los medicamentos para la presin arterial segn lo prescrito.  Limitar el consumo de sal y cafena.  Hacer ejercicio lo ms posible durante al menos 30 minutos durante 5 das a la semana tambin puede ayudarlo a reducir su presin arterial.  Tmese la presin arterial en casa si puede. Anote estos nmeros y llvelos a sus visitas.  Si tiene [place] pregunta, llame a la clnica.  Seguimiento con el Dr. Hurshel Keys 8/11 a las 9:45 AM   [name] it was a pleasure seeing you today.   Your blood pressure today is improving.   Continue taking blood pressure medications as prescribed.   Limiting salt intake and caffeine.  Exercising as able for at least 30 minutes for 5 days out of the week, can also help you lower your blood pressure.  Take your blood pressure at home if you are able. Please write down these numbers and bring them to your visits.  If you have any questions please call the clinic.   Follow-up with Dr. Johnney Ou on 11/8 at 9:45 AM

## 2021-05-18 NOTE — Assessment & Plan Note (Signed)
Hypertension reasonably well controlled and no significant medication side effects noted.  Current treatment plan is effective, no change in therapy. Follow up: 1 month and as needed. Counseled on lifestyle modifications for blood pressure control including reduced dietary sodium, increased exercise, adequate sleep.

## 2021-05-18 NOTE — Progress Notes (Signed)
   Subjective:    Patient ID: [name], male    DOB: 1945-08-24, 75 y.o.   MRN: [id number]  HPI Patient is a 75 y.o. male who presents for hypertension management. He is in good spirits and presents without assistance with his granddaughter who translated for patient. Patient was referred on and last seen by Primary Care Provider on 04/06/21.  Hypertension ROS: taking medications as instructed, no medication side effects noted, noting some chest pains described as pain that occurs after completing activities and eating (states this is not new and has been ongoing), no dyspnea on exertion, no swelling of ankles, no orthostatic dizziness or lightheadedness, and reports checking blood pressure at home from time to time but cannot recall any readings .  New concerns: none.   Objective:   Last 3 Office BP readings: BP Readings from Last 3 Encounters:  05/18/21 138/66  04/06/21 (!) 107/58  02/28/21 135/77    BMET    Component Value Date/Time   NA 134 04/06/2021 1558   K 4.7 04/06/2021 1558   CL 95 (L) 04/06/2021 1558   CO2 23 04/06/2021 1558   GLUCOSE 94 04/06/2021 1558   GLUCOSE 119 (H) 09/12/2017 2119   BUN 14 04/06/2021 1558   CREATININE 1.06 04/06/2021 1558   CALCIUM 9.3 04/06/2021 1558   GFRNONAA 88 03/25/2020 1509   GFRAA 102 03/25/2020 1509    Renal function: CrCl cannot be calculated (Patient's most recent lab result is older than the maximum 21 days allowed.).  Clinical ASCVD: Yes  The ASCVD Risk score (Arnett DK, et al., 2019) failed to calculate for the following reasons:   Cannot find a previous HDL lab   Cannot find a previous total cholesterol lab  BP 138/66   Pulse 68   Appearance alert, well appearing, and in no distress. General exam BP noted to be mildly elevated today in office.   Assessment/Plan:  Hypertension reasonably well controlled and no significant medication side effects noted.  Current treatment plan is effective, no change in  therapy. Follow up: 1 month and as needed..  Counseled on lifestyle modifications for blood pressure control including reduced dietary sodium, increased exercise, adequate sleep.  Results reviewed and written information provided.   Total time in face-to-face counseling 20 minutes.

## 2021-05-19 ENCOUNTER — Other Ambulatory Visit: Payer: Self-pay | Admitting: Student

## 2021-05-19 DIAGNOSIS — M48062 Spinal stenosis, lumbar region with neurogenic claudication: Secondary | ICD-10-CM

## 2021-05-19 MED ORDER — PREGABALIN 25 MG PO CAPS: 25.0000 mg | ORAL_CAPSULE | Freq: Three times a day (TID) | ORAL | 2 refills | Status: DC

## 2021-05-19 NOTE — Progress Notes (Signed)
Pregabalin refilled. PDMP reviewed and appropriate.

## 2021-05-19 NOTE — Telephone Encounter (Signed)
Next appt scheduled 06/27/21 with PCP.

## 2021-05-22 ENCOUNTER — Other Ambulatory Visit: Payer: Self-pay | Admitting: Student

## 2021-05-22 DIAGNOSIS — T7840XA Allergy, unspecified, initial encounter: Secondary | ICD-10-CM

## 2021-05-23 NOTE — Telephone Encounter (Signed)
Next appt scheduled 06/27/21 with PCP.

## 2021-05-26 ENCOUNTER — Other Ambulatory Visit: Payer: Self-pay | Admitting: Internal Medicine

## 2021-05-26 DIAGNOSIS — E119 Type 2 diabetes mellitus without complications: Secondary | ICD-10-CM

## 2021-05-26 DIAGNOSIS — I10 Essential (primary) hypertension: Secondary | ICD-10-CM

## 2021-06-27 ENCOUNTER — Ambulatory Visit (INDEPENDENT_AMBULATORY_CARE_PROVIDER_SITE_OTHER): Payer: Medicare Other | Admitting: Student

## 2021-06-27 ENCOUNTER — Encounter: Payer: Self-pay | Admitting: Student

## 2021-06-27 VITALS — BP 149/65 | HR 59 | Temp 98.1°F | Wt 160.4 lb

## 2021-06-27 DIAGNOSIS — M545 Low back pain, unspecified: Secondary | ICD-10-CM

## 2021-06-27 DIAGNOSIS — M546 Pain in thoracic spine: Secondary | ICD-10-CM | POA: Diagnosis not present

## 2021-06-27 DIAGNOSIS — Z0000 Encounter for general adult medical examination without abnormal findings: Secondary | ICD-10-CM | POA: Diagnosis not present

## 2021-06-27 MED ORDER — ZOSTER VAC RECOMB ADJUVANTED 50 MCG/0.5ML IM SUSR: 0.5000 mL | Freq: Once | INTRAMUSCULAR | 0 refills | Status: AC

## 2021-06-27 MED ORDER — NAPROXEN 500 MG PO TABS: 500.0000 mg | ORAL_TABLET | Freq: Two times a day (BID) | ORAL | 0 refills | Status: AC

## 2021-06-27 NOTE — Assessment & Plan Note (Signed)
His granddaughter reports that he received his flu vaccine a few months ago. Additionally, he has received 2 doses of the PPSV23 vaccine and is not due an additional dose. We discussed the Shingrix vaccine, including the side effects of pain and inflammation at the vaccination site, and will write a prescription for him to receive this at his local pharmacy whenever most convenient.

## 2021-06-27 NOTE — Progress Notes (Signed)
  Subjective:     Patient ID: [name], male   DOB: Mar 18, 1946, 75 y.o.   MRN: [id number]  [name] presents to clinic for follow-up care for his back pain. He is accompanied by his granddaughter. He is interested in discussing the results of the MRI he received on 04/20/21 that was ordered for his back pain.  He reports his back pain has worsened since his last appointment. He describes it as an aching pain that does not radiate to his legs, and rates it an 8-9 out of ten. The pain is located in the center of his spine, and extends down the site of his previous surgery. He can walk but is limited in doing so. His granddaughter says he can walk for about 30 minutes before he has to sit down because of the pain.  He denies any fever or urinary/fecal incontinence. He denies any perianal numbness.  Back Pain This is a chronic problem. The problem occurs constantly. The problem has been gradually worsening since onset. The pain is present in the thoracic spine and lumbar spine. The quality of the pain is described as aching. The pain does not radiate. The pain is at a severity of 9/10. Pertinent negatives include no chest pain, fever or headaches.    Review of Systems  Constitutional:  Negative for chills and fever.  Cardiovascular:  Negative for chest pain.  Gastrointestinal:  Positive for diarrhea. Negative for constipation, nausea and vomiting.       Reports this is not new.  Genitourinary:  Negative for difficulty urinating.  Musculoskeletal:  Positive for back pain.  Neurological:  Negative for headaches.      Objective:   Physical Exam Constitutional:      Appearance: Normal appearance.  HENT:     Head: Normocephalic and atraumatic.  Cardiovascular:     Rate and Rhythm: Normal rate and regular rhythm.     Heart sounds: Normal heart sounds.  Pulmonary:     Effort: Pulmonary effort is normal. No respiratory distress.     Breath sounds: Normal breath sounds.   Abdominal:     General: Bowel sounds are normal.     Tenderness: There is no abdominal tenderness.  Musculoskeletal:     Thoracic back: No swelling, edema, spasms, tenderness or bony tenderness.     Lumbar back: No swelling, spasms, tenderness or bony tenderness. Positive right straight leg raise test and positive left straight leg raise test.     Comments: Straight leg raise test positive for back pain localized to the spine when legs raised 30 degrees, bilaterally.  Neurological:     General: No focal deficit present.     Mental Status: He is alert.       Assessment & Plan:   See problem-based charting.

## 2021-06-27 NOTE — Patient Instructions (Signed)
Thank you, Mr.Tidus Cid-Prado for allowing [place] to provide your care today. Today we discussed:  1) Back pain:  We will make a referral for you to physical therapy. They can also assess you for assistive devices like a walker to help with daily activities. We will follow-up in clinic to see re-assess your back pain.  We will also prescribe a short course of medicine (Naprosyn) to help with your back pain. Please take this for the next 5 days.  2) Health maintenance:    We will write a prescription for you to get your Shingrix vaccine. You can do this at the [facility] [place]   Referrals ordered today:   Referral Orders         Ambulatory referral to Physical Therapy       I have ordered the following medication/changed the following medications:   Stop the following medications: There are no discontinued medications.   Start the following medications: Meds ordered this encounter  Medications   naproxen (NAPROSYN) 500 MG tablet    Sig: Take 1 tablet (500 mg total) by mouth 2 (two) times daily with a meal for 5 days.    Dispense:  10 tablet    Refill:  0     Follow up: 3 months   Remember: Please call 911 if you have any sudden leg weakness or if you experience urinary or fecal incontinence.  Should you have any questions or concerns please call the internal medicine clinic at [phone].    Sanjuana Letters, D.O. [place]

## 2021-06-27 NOTE — Assessment & Plan Note (Signed)
Patient presents with back pain of thoracolumbar region now worsened since prior visit. He rates the pain as 8-9 out of 10, and describes it as "aching" with no radiation to the legs. No red flag symptoms of leg weakness, point tenderness to palpation, fevers, urinary or fecal incontinence, or perianal numbness. Straight leg raise test positive for back pain localized to spine at 30 degrees raised bilaterally. We discussed the results of his MRI from September which showed "mild thoracic spondylosis without spinal stenosis and mild-to-moderate multilevel neural foraminal stenosis due to facet arthrosis" in thoracic vertebrae, as well as "interval L3-S1 fusion without residual compressive stenosis and moderate facet hypertrophy at L2-3 without stenosis" in the lumbar vertebrae. These results were communicated to the patient as "demonstrating some narrowing of the spinal canal but no evidence of nerve compression." The constellation of his symptoms as well as the positive straight leg test lend the impression that his pain is complex in nature, but likely related to chronic OA of the spine, and he reports no radiation to his legs or claudication suggestive of nerve compression, as well as no evidence of paraspinal muscle spasms. We discussed referral to physical therapy as the next best option, and will follow-up in office in 3 months time to discuss his progress. He reports having received physical therapy in the past with some benefit, though stopped the at-home exercises after some time. Additionally, he may benefit from a trial of NSAIDs for OA in his back, and we will trial a 5 day course of Naproxen.  Plan: - Referral to physical therapy - Naproxen 500mg  BID for 5 days. - Follow-up in clinic in 3 months

## 2021-06-27 NOTE — Progress Notes (Signed)
Attestation for Student Documentation:  I personally was present and performed or re-performed the history, physical exam and medical decision-making activities of this service and have verified that the service and findings are accurately documented in the student's note.  [name] is a 75 y/o patient follow up today to discuss his chronic back pain and recent MRI findings. No acute concern or need for emergent surgical intervention. Patient without red flag symptoms as listed in student doctor Haddad's note.  Patient without improvement of back pain and I suspect this is secondary to ongoing arthritis. Will prescribe short course of naproxen (no history of kidney disease) and follow up with physical therapy. If no improvement, will plan for referral back to neurosurgery. Given strict return precautions if red flag symptoms occur.   Spanish interpretor services used during this encounter  Riesa Pope, MD 06/27/2021, 8:24 PM

## 2021-06-28 NOTE — Progress Notes (Signed)
Internal Medicine Clinic Attending  Case discussed with Dr. Katsadouros  At the time of the visit.  We reviewed the resident's history and exam and pertinent patient test results.  I agree with the assessment, diagnosis, and plan of care documented in the resident's note.  

## 2021-08-13 ENCOUNTER — Other Ambulatory Visit: Payer: Self-pay | Admitting: Student

## 2021-08-13 DIAGNOSIS — T7840XA Allergy, unspecified, initial encounter: Secondary | ICD-10-CM

## 2021-08-21 ENCOUNTER — Other Ambulatory Visit: Payer: Self-pay | Admitting: Internal Medicine

## 2021-08-21 DIAGNOSIS — M48062 Spinal stenosis, lumbar region with neurogenic claudication: Secondary | ICD-10-CM

## 2021-10-01 ENCOUNTER — Other Ambulatory Visit: Payer: Self-pay | Admitting: Student

## 2021-10-01 DIAGNOSIS — I10 Essential (primary) hypertension: Secondary | ICD-10-CM

## 2021-11-20 ENCOUNTER — Other Ambulatory Visit: Payer: Self-pay | Admitting: Student

## 2021-11-20 DIAGNOSIS — T7840XA Allergy, unspecified, initial encounter: Secondary | ICD-10-CM

## 2021-11-22 ENCOUNTER — Other Ambulatory Visit: Payer: Self-pay

## 2021-11-22 DIAGNOSIS — E119 Type 2 diabetes mellitus without complications: Secondary | ICD-10-CM

## 2021-11-22 MED ORDER — METOPROLOL SUCCINATE ER 50 MG PO TB24: 50.0000 mg | ORAL_TABLET | Freq: Every day | ORAL | 2 refills | Status: DC

## 2021-11-22 MED ORDER — METFORMIN HCL 500 MG PO TABS: ORAL_TABLET | ORAL | 1 refills | Status: DC

## 2021-11-22 NOTE — Telephone Encounter (Signed)
Needs refill for blood sugar medication  ?[id number] - [address]?

## 2021-11-22 NOTE — Telephone Encounter (Signed)
Talked to pt's granddaughter - stated he needs a refill on his blood sugar and blood pressure meds. Agreed to schedule pt an appt - 4/19@ 0915 AM with Dr Alfonse Spruce. ?

## 2021-12-06 ENCOUNTER — Ambulatory Visit (INDEPENDENT_AMBULATORY_CARE_PROVIDER_SITE_OTHER): Payer: Medicare Other | Admitting: Student

## 2021-12-06 ENCOUNTER — Encounter: Payer: Self-pay | Admitting: Student

## 2021-12-06 VITALS — BP 152/80 | HR 66 | Temp 98.1°F | Ht 66.0 in

## 2021-12-06 DIAGNOSIS — M47816 Spondylosis without myelopathy or radiculopathy, lumbar region: Secondary | ICD-10-CM

## 2021-12-06 DIAGNOSIS — I10 Essential (primary) hypertension: Secondary | ICD-10-CM

## 2021-12-06 DIAGNOSIS — M545 Low back pain, unspecified: Secondary | ICD-10-CM

## 2021-12-06 DIAGNOSIS — K529 Noninfective gastroenteritis and colitis, unspecified: Secondary | ICD-10-CM

## 2021-12-06 DIAGNOSIS — M4716 Other spondylosis with myelopathy, lumbar region: Secondary | ICD-10-CM

## 2021-12-06 DIAGNOSIS — E119 Type 2 diabetes mellitus without complications: Secondary | ICD-10-CM

## 2021-12-06 DIAGNOSIS — M546 Pain in thoracic spine: Secondary | ICD-10-CM

## 2021-12-06 DIAGNOSIS — I2510 Atherosclerotic heart disease of native coronary artery without angina pectoris: Secondary | ICD-10-CM | POA: Diagnosis not present

## 2021-12-06 LAB — POCT GLYCOSYLATED HEMOGLOBIN (HGB A1C): Hemoglobin A1C: 6.3 % — AB (ref 4.0–5.6)

## 2021-12-06 LAB — GLUCOSE, CAPILLARY: Glucose-Capillary: 144 mg/dL — ABNORMAL HIGH (ref 70–99)

## 2021-12-06 MED ORDER — CARVEDILOL 6.25 MG PO TABS: 6.2500 mg | ORAL_TABLET | Freq: Two times a day (BID) | ORAL | 11 refills | Status: DC

## 2021-12-06 MED ORDER — DULOXETINE HCL 30 MG PO CPEP: 30.0000 mg | ORAL_CAPSULE | Freq: Every day | ORAL | 2 refills | Status: DC

## 2021-12-06 MED ORDER — LOPERAMIDE HCL 2 MG PO TABS: 2.0000 mg | ORAL_TABLET | Freq: Four times a day (QID) | ORAL | 1 refills | Status: DC | PRN

## 2021-12-06 NOTE — Assessment & Plan Note (Addendum)
He has had a coronary angiography in 2018 which showed mild nonobstructive CAD. ? ?-Continue atorvastatin 80 mg.  Recheck lipid panel ?-Switch metoprolol to carvedilol 6.25 mg twice daily for better blood pressure control ? ?Addendum ?LDL at goal ?

## 2021-12-06 NOTE — Assessment & Plan Note (Addendum)
A1c of 6.3.  Currently taking metformin 500 mg twice daily. ? ?-Will stop metformin to reduce pill burden and chronic diarrhea ? ?Addendum ?When I called patient back for results, he expressed a concern of elevated fasting blood sugar this morning at 170.  He would like to be back on metformin.  Said that his diarrhea did not improve after stopping metformin. ? ?-I will resume metformin 500 mg daily dose.  Will use extended release form to reduce side effects. ?
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
## 2021-12-06 NOTE — Assessment & Plan Note (Addendum)
Reports an episode of diarrhea every 2 to 3 days.  Is currently taking loperamide as needed.  Denies abdominal pain, melena or hematochezia. ? 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  He has not recent colonoscopy and endoscopy in 2019 which were unremarkable.  He also report prior abdominal surgery due to trauma.  He is not sure if metformin is causing his diarrhea. ? ?-Given his partial gastrectomy, I will check B12, folate, B1, vitamin D and iron ?-Stop metformin ? ?Addendum ?Patient is deficient in B12, folate, vitamin D and iron, secondary to his partial gastrectomy.  Will advise patient to take complete multivitamin over-the-counter.  At next visit will check micronutrients such as selenium and copper. ? ?Patient agrees with the plan.  Utilizing interpreter service. ?

## 2021-12-06 NOTE — Assessment & Plan Note (Signed)
Endorses chronic back pain that has not changed recently.  Denies any red flag symptoms including loss control BM or urination.  No fever or chills.  Denies lumbar radiculopathy.  He has tried Tylenol, and NSAIDs without relief.  He does not want to take any pain medication due to concerns about side effects. ? ?MRI of thoracic and lumbar showed no spinal stenosis.  He has had L3 S1 fusion surgery done in the past. ? ?PT referral was placed in the past but they could not get to him.  Patient said that he has never received a call from them. ? ?-PT referral placed again ?-Patient agrees with starting Cymbalta 30 mg ?

## 2021-12-06 NOTE — Assessment & Plan Note (Signed)
Pressure mildly elevated 144/75.  Per note he was supposed him on carvedilol but he is currently taking metoprolol. ? ?-Will switch to carvedilol 6.25 mg twice daily for better blood pressure control ?

## 2021-12-06 NOTE — Patient Instructions (Addendum)
[name], ? ?Fue agradable verte en la cl?nica hoy. Aqu? hay un resumen de lo que hablamos: ? ?1. Hice una derivaci?n para fisioterapia por su dolor de espalda. Tambi?n le recet? Cymbalta 30 mg. Por favor, tome 1 tableta al d?a. ? ?2. Diarrea cr?nica: esto podr?a deberse a su cirug?a de est?mago anterior. Dejar? de tomar metformina para ver si ayuda. Tambi?n revisar? su sangre en busca de vitaminas para ver si necesita alg?n suplemento. ? ?3. Diabetes: Su A1c es 6.3. Esto es realmente bueno. Puede suspender la metformina. No es necesario que controle su nivel de az?car en la sangre todos los d?as. Solo se revisa cuando siente que tiene un nivel bajo de az?car en la [place]. ? ?4. Tu presi?n arterial est? ligeramente m?s alta hoy. Por favor, suspenda el metoprolol. Comenc? con Coreg, que tiene un mejor control de la presi?n arterial. Tomar? este nuevo medicamento dos veces al d?a. ? ?Por favor regrese en 3 meses ? ? ?It was nice seeing you in the clinic today.  Here is a summary what we talked about: ? ?1.  I placed a referral for physical therapy for your back pain.  I also prescribed him Cymbalta 30 mg.  Please take 1 tablet a day. ? ?2.  Chronic diarrhea: This could be from your prior stomach surgery.  I will stop metformin to see if it helps.  I will also check your blood for vitamins to see if you need any supplements. ? ?3.  Diabetes: Your A1c is 6.3.  This is really good.  You can stop the metformin.  You do not need to check your blood sugar daily.  Only checked when you feel like you have low blood sugar. ? ?4.  Your blood pressure slightly higher today.  Please stop metoprolol.  I started Coreg which has better blood pressure control.  You will take this new medication twice daily. ? ?Please return in 3 months ? ?Take care ? ?Dr. Alfonse Spruce ?

## 2021-12-07 LAB — VITAMIN D 25 HYDROXY (VIT D DEFICIENCY, FRACTURES): Vit D, 25-Hydroxy: 22 ng/mL — ABNORMAL LOW (ref 30.0–100.0)

## 2021-12-07 LAB — LIPID PANEL
Chol/HDL Ratio: 2.9 ratio (ref 0.0–5.0)
Cholesterol, Total: 114 mg/dL (ref 100–199)
HDL: 39 mg/dL — ABNORMAL LOW (ref >39)
LDL Chol Calc (NIH): 58 mg/dL (ref 0–99)
Triglycerides: 84 mg/dL (ref 0–149)
VLDL Cholesterol Cal: 17 mg/dL (ref 5–40)

## 2021-12-07 LAB — IRON,TIBC AND FERRITIN PANEL
Ferritin: 15 ng/mL — ABNORMAL LOW (ref 30–400)
Iron Saturation: 14 % — ABNORMAL LOW (ref 15–55)
Iron: 63 ug/dL (ref 38–169)
Total Iron Binding Capacity: 445 ug/dL (ref 250–450)
UIBC: 382 ug/dL — ABNORMAL HIGH (ref 111–343)

## 2021-12-07 LAB — FOLATE: Folate: 15.3 ng/mL (ref >3.0)

## 2021-12-07 LAB — VITAMIN B12: Vitamin B-12: 230 pg/mL — ABNORMAL LOW (ref 232–1245)

## 2021-12-07 NOTE — Progress Notes (Signed)
Internal Medicine Clinic Attending  Case discussed with Dr. Nguyen  At the time of the visit.  We reviewed the resident's history and exam and pertinent patient test results.  I agree with the assessment, diagnosis, and plan of care documented in the resident's note. 

## 2021-12-08 LAB — VITAMIN B1: Thiamine: 117 nmol/L (ref 66.5–200.0)

## 2021-12-09 ENCOUNTER — Other Ambulatory Visit: Payer: Self-pay | Admitting: Student

## 2021-12-09 DIAGNOSIS — M48062 Spinal stenosis, lumbar region with neurogenic claudication: Secondary | ICD-10-CM

## 2021-12-11 ENCOUNTER — Other Ambulatory Visit: Payer: Self-pay | Admitting: Student

## 2021-12-11 ENCOUNTER — Telehealth: Payer: Self-pay

## 2021-12-11 MED ORDER — METFORMIN HCL ER 500 MG PO TB24: 500.0000 mg | ORAL_TABLET | Freq: Every day | ORAL | 2 refills | Status: DC

## 2021-12-11 NOTE — Addendum Note (Signed)
Addended byGaylan Gerold on: 12/11/2021 03:28 PM ? ? Modules accepted: Orders ? ?

## 2021-12-11 NOTE — Progress Notes (Signed)
Refilled pregabalin. PDMP appropriate  ?

## 2021-12-11 NOTE — Telephone Encounter (Signed)
Patients daughter called she is requesting a call back regarding lab results, per daughter call her after 2pm  ?

## 2021-12-13 ENCOUNTER — Other Ambulatory Visit: Payer: Self-pay | Admitting: Student

## 2021-12-13 DIAGNOSIS — E119 Type 2 diabetes mellitus without complications: Secondary | ICD-10-CM

## 2021-12-13 NOTE — Progress Notes (Signed)
Referral placed for ophthalmology, history of T2DM. ?

## 2021-12-18 ENCOUNTER — Other Ambulatory Visit: Payer: Self-pay | Admitting: Internal Medicine

## 2021-12-18 DIAGNOSIS — E119 Type 2 diabetes mellitus without complications: Secondary | ICD-10-CM

## 2022-02-15 ENCOUNTER — Other Ambulatory Visit: Payer: Self-pay | Admitting: Student

## 2022-02-16 ENCOUNTER — Other Ambulatory Visit: Payer: Self-pay | Admitting: Student

## 2022-02-16 DIAGNOSIS — T7840XA Allergy, unspecified, initial encounter: Secondary | ICD-10-CM

## 2022-03-22 ENCOUNTER — Other Ambulatory Visit: Payer: Self-pay | Admitting: Student

## 2022-03-22 DIAGNOSIS — M47816 Spondylosis without myelopathy or radiculopathy, lumbar region: Secondary | ICD-10-CM

## 2022-05-09 ENCOUNTER — Other Ambulatory Visit: Payer: Self-pay | Admitting: Student

## 2022-05-09 DIAGNOSIS — I209 Angina pectoris, unspecified: Secondary | ICD-10-CM

## 2022-05-09 DIAGNOSIS — N401 Enlarged prostate with lower urinary tract symptoms: Secondary | ICD-10-CM

## 2022-05-09 DIAGNOSIS — K219 Gastro-esophageal reflux disease without esophagitis: Secondary | ICD-10-CM

## 2022-05-22 ENCOUNTER — Other Ambulatory Visit: Payer: Self-pay | Admitting: Student

## 2022-05-22 DIAGNOSIS — T7840XA Allergy, unspecified, initial encounter: Secondary | ICD-10-CM

## 2022-05-30 ENCOUNTER — Ambulatory Visit (HOSPITAL_COMMUNITY)
Admission: RE | Admit: 2022-05-30 | Discharge: 2022-05-30 | Disposition: A | Discharge status: Home / Self Care | Payer: Medicare Other | Source: Ambulatory Visit | Attending: Internal Medicine | Admitting: Internal Medicine

## 2022-05-30 ENCOUNTER — Other Ambulatory Visit: Payer: Self-pay

## 2022-05-30 ENCOUNTER — Ambulatory Visit (INDEPENDENT_AMBULATORY_CARE_PROVIDER_SITE_OTHER): Payer: Medicare Other | Admitting: Internal Medicine

## 2022-05-30 ENCOUNTER — Encounter: Payer: Self-pay | Admitting: Internal Medicine

## 2022-05-30 VITALS — BP 161/90 | HR 79 | Temp 98.0°F | Ht 66.0 in | Wt 172.0 lb

## 2022-05-30 DIAGNOSIS — I209 Angina pectoris, unspecified: Secondary | ICD-10-CM

## 2022-05-30 DIAGNOSIS — Z23 Encounter for immunization: Secondary | ICD-10-CM | POA: Insufficient documentation

## 2022-05-30 DIAGNOSIS — N400 Enlarged prostate without lower urinary tract symptoms: Secondary | ICD-10-CM | POA: Diagnosis not present

## 2022-05-30 DIAGNOSIS — F325 Major depressive disorder, single episode, in full remission: Secondary | ICD-10-CM

## 2022-05-30 DIAGNOSIS — M48062 Spinal stenosis, lumbar region with neurogenic claudication: Secondary | ICD-10-CM

## 2022-05-30 DIAGNOSIS — K219 Gastro-esophageal reflux disease without esophagitis: Secondary | ICD-10-CM | POA: Insufficient documentation

## 2022-05-30 DIAGNOSIS — Z87891 Personal history of nicotine dependence: Secondary | ICD-10-CM

## 2022-05-30 DIAGNOSIS — E119 Type 2 diabetes mellitus without complications: Secondary | ICD-10-CM | POA: Insufficient documentation

## 2022-05-30 DIAGNOSIS — I2510 Atherosclerotic heart disease of native coronary artery without angina pectoris: Secondary | ICD-10-CM

## 2022-05-30 DIAGNOSIS — Z7984 Long term (current) use of oral hypoglycemic drugs: Secondary | ICD-10-CM

## 2022-05-30 DIAGNOSIS — K529 Noninfective gastroenteritis and colitis, unspecified: Secondary | ICD-10-CM | POA: Diagnosis not present

## 2022-05-30 DIAGNOSIS — I10 Essential (primary) hypertension: Secondary | ICD-10-CM | POA: Diagnosis not present

## 2022-05-30 MED ORDER — LOPERAMIDE HCL 2 MG PO TABS: 2.0000 mg | ORAL_TABLET | Freq: Four times a day (QID) | ORAL | 1 refills | Status: DC | PRN

## 2022-05-30 MED ORDER — PREGABALIN 25 MG PO CAPS: ORAL_CAPSULE | ORAL | 0 refills | Status: DC

## 2022-05-30 MED ORDER — ZOSTER VAC RECOMB ADJUVANTED 50 MCG/0.5ML IM SUSR: 0.5000 mL | Freq: Once | INTRAMUSCULAR | 1 refills | Status: AC

## 2022-05-30 MED ORDER — NITROGLYCERIN 0.4 MG SL SUBL: 0.4000 mg | SUBLINGUAL_TABLET | Freq: Every day | SUBLINGUAL | 2 refills | Status: DC

## 2022-05-30 MED ORDER — LISINOPRIL 10 MG PO TABS: 10.0000 mg | ORAL_TABLET | Freq: Every day | ORAL | 11 refills | Status: DC

## 2022-05-30 MED ORDER — ONETOUCH DELICA LANCETS 33G MISC: 1 refills | Status: DC

## 2022-05-30 NOTE — Assessment & Plan Note (Addendum)
Patient explains that over the last ~3 months he has noticed increase frequency and severity of angina symptoms. The pain is sharp and centered over his chest without radiation. The episodes happen with exertion as well as at rest and occasionally wake him from his sleep. He does not have nitroglycerin at home and does not follow with cardiology. He has had occasional dyspnea associated with the episodes. His most recent episode was this morning prior to his OV. Assessment:EKG obtained and showed normal sinus rhythm without any abnormalities. I am concerned that his angina is now progressing to unstable angina. Because he is not experiencing active chest pain at this time, I will hold off on checking troponins. He could be having severe GERD symptoms but he states reports adherence to his omeprazole so I do not suspect this to be a major cause of his symptoms. Plan:Referral placed to cardiology. Continue other GDMT. Nitroglycerin refilled.

## 2022-05-30 NOTE — Assessment & Plan Note (Signed)
Current regimen is pregabalin 25 mg TID which adequately controls his symptoms. Refill sent today.

## 2022-05-30 NOTE — Assessment & Plan Note (Signed)
Current regimen is omeprazole 20 mg daily which he endorses taking as prescribed.

## 2022-05-30 NOTE — Assessment & Plan Note (Addendum)
Current regimen is atoravastatin 80 mg daily for secondary prevention. LDL was at goal, 58, when last checked in April 2023. Plan:Continue current regimen. Recheck lipid panel at next OV.

## 2022-05-30 NOTE — Assessment & Plan Note (Signed)
Pneumonia vaccine given today.  

## 2022-05-30 NOTE — Assessment & Plan Note (Signed)
Patient is requesting to discontinue Duloxetine which is prescribed for depression. He states that he does not have depression at this time and does not feel that he was ever depressed. His granddaughter states that he has recently began living with family and since making this transition, his mood seems to be much better even while not taking the Duloxetine. Plan:Discussed other medication options for depression that we can try in the future if Stark has recurrent depression. I will take Duloxetine off of his medication list as he is not taking it.

## 2022-05-30 NOTE — Assessment & Plan Note (Signed)
Current regimen is tamsulosin 0.4 mg daily. Patient denies urinary symptoms at this time. Plan:Continue current regimen.

## 2022-05-30 NOTE — Assessment & Plan Note (Deleted)
Current regimen is pregabalin 25 mg TID which adequately controls his symptoms. Refill sent today.
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
## 2022-05-30 NOTE — Assessment & Plan Note (Signed)
Flu vaccine given today. 

## 2022-05-30 NOTE — Assessment & Plan Note (Signed)
HbA1c last checked in April 2023 was 6.3%, which is stable over the last 4 years. Current regimen is metformin 500 mg daily. He has been out of lancets for 2 months and unable to get refills for an unknown reason so for that reason he has not been checking his blood sugar at home. Previously he was checking twice daily. Plan:Urine protein studies, BMP, and foot exam completed today. Continue current regimen.

## 2022-05-30 NOTE — Assessment & Plan Note (Signed)
Loperamide refill sent.

## 2022-05-30 NOTE — Patient Instructions (Addendum)
[name],  Fue un placer cuidar de usted hoy.  Los medicamentos que toma y por qu se detallan a continuacin:  Atorvastatina (lipitor): colesterol alto Carvedilol (coreg): presin arterial Loperamida (imodio): diarrea Metformina: diabetes Nitroglicerina: dolor en el pecho Omeprazol (prilosec): reflujo cido Pregabalina (lyrica): dolor nervioso Tamsulosina (flomax): dificultades urinarias  Tambin le estoy iniciando un nuevo medicamento para la presin arterial llamado lisinopril. [employer].  Tambin estoy revisando algunos laboratorios y les har saber qu muestran [place]. Lorenz Coaster derivacin al cardilogo por su dolor en el pecho y recibir una llamada para programar esta cita.  Regrese para seguimiento en aproximadamente 3 meses, o antes si es necesario.  Mi mejor, Dr. Marlou Sa   [name],  It was a pleasure to care for you today.  The medications that you take, and why, are below:  Atorvastatin (lipitor): high cholesterol Carvedilol (coreg): blood pressure Loperamide (imodium): diarrhea Metformin: diabetes Nitroglycerin: chest pain Omeprazole (prilosec): Acid reflux Pregabalin (lyrica): nerve pain Tamsulosin (flomax): urinary difficulties  I am also starting you on a new medication for your blood pressure called lisinopril. You will take this one a day every day as well.  I am also checking some labs and will let you know what these results show. I have sent a referral to the cardiologist for your chest pain and you will get a call to schedule this appointment.  Please return for follow up in about 3 months, or sooner if needed.  My best, Dr. Marlou Sa

## 2022-05-30 NOTE — Assessment & Plan Note (Signed)
BP above goal today, 156/91 with no improvement on recheck. He did take his blood pressure medication this morning. Current regimen is carvedilol 6.25 mg BID. Plan:Continue carvedilol 6.25 mg BID and start lisinopril 10 mg daily.

## 2022-05-30 NOTE — Assessment & Plan Note (Signed)
Prescription for shingles vaccine sent to patient's pharmacy.

## 2022-05-31 ENCOUNTER — Encounter: Payer: Self-pay | Admitting: Internal Medicine

## 2022-05-31 LAB — BMP8+ANION GAP
Anion Gap: 15 mmol/L (ref 10.0–18.0)
BUN/Creatinine Ratio: 10 (ref 10–24)
BUN: 8 mg/dL (ref 8–27)
CO2: 24 mmol/L (ref 20–29)
Calcium: 9.1 mg/dL (ref 8.6–10.2)
Chloride: 101 mmol/L (ref 96–106)
Creatinine, Ser: 0.81 mg/dL (ref 0.76–1.27)
Glucose: 104 mg/dL — ABNORMAL HIGH (ref 70–99)
Potassium: 4.5 mmol/L (ref 3.5–5.2)
Sodium: 140 mmol/L (ref 134–144)
eGFR: 91 mL/min/{1.73_m2} (ref >59)

## 2022-05-31 LAB — MICROALBUMIN / CREATININE URINE RATIO
Creatinine, Urine: 98 mg/dL
Microalb/Creat Ratio: 9 mg/g creat (ref 0–29)
Microalbumin, Urine: 8.7 ug/mL

## 2022-05-31 NOTE — Progress Notes (Signed)
BMP overall unremarkable. Attempt made to discuss these  results with the patient with  the assistance of [facility], [phone], however there was no answer. VM left requesting clinic call back for these results, and I will attempt to contact him again at a later time.

## 2022-06-04 NOTE — Progress Notes (Signed)
Internal Medicine Clinic Attending  Case discussed with Dr. Dean  at the time of the visit.  We reviewed the resident's history and exam and pertinent patient test results.  I agree with the assessment, diagnosis, and plan of care documented in the resident's note.  
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
## 2022-07-10 ENCOUNTER — Emergency Department (HOSPITAL_COMMUNITY): Payer: Medicare Other

## 2022-07-10 ENCOUNTER — Ambulatory Visit: Payer: Medicare Other | Attending: Cardiology | Admitting: Cardiology

## 2022-07-10 ENCOUNTER — Observation Stay (HOSPITAL_COMMUNITY)
Admission: EM | Admit: 2022-07-10 | Discharge: 2022-07-12 | Disposition: A | Discharge status: Home / Self Care | Payer: Medicare Other | Attending: Cardiology | Admitting: Cardiology

## 2022-07-10 ENCOUNTER — Encounter (HOSPITAL_COMMUNITY): Payer: Self-pay | Admitting: Emergency Medicine

## 2022-07-10 VITALS — BP 156/88 | HR 96 | Ht 66.0 in | Wt 165.2 lb

## 2022-07-10 DIAGNOSIS — Z7982 Long term (current) use of aspirin: Secondary | ICD-10-CM | POA: Insufficient documentation

## 2022-07-10 DIAGNOSIS — K219 Gastro-esophageal reflux disease without esophagitis: Secondary | ICD-10-CM | POA: Insufficient documentation

## 2022-07-10 DIAGNOSIS — N400 Enlarged prostate without lower urinary tract symptoms: Secondary | ICD-10-CM | POA: Diagnosis not present

## 2022-07-10 DIAGNOSIS — J439 Emphysema, unspecified: Secondary | ICD-10-CM | POA: Insufficient documentation

## 2022-07-10 DIAGNOSIS — I200 Unstable angina: Secondary | ICD-10-CM

## 2022-07-10 DIAGNOSIS — I2510 Atherosclerotic heart disease of native coronary artery without angina pectoris: Secondary | ICD-10-CM | POA: Diagnosis present

## 2022-07-10 DIAGNOSIS — I10 Essential (primary) hypertension: Secondary | ICD-10-CM | POA: Diagnosis not present

## 2022-07-10 DIAGNOSIS — E119 Type 2 diabetes mellitus without complications: Secondary | ICD-10-CM

## 2022-07-10 DIAGNOSIS — Z79899 Other long term (current) drug therapy: Secondary | ICD-10-CM | POA: Diagnosis not present

## 2022-07-10 DIAGNOSIS — J438 Other emphysema: Secondary | ICD-10-CM | POA: Diagnosis present

## 2022-07-10 DIAGNOSIS — I209 Angina pectoris, unspecified: Secondary | ICD-10-CM

## 2022-07-10 DIAGNOSIS — E7800 Pure hypercholesterolemia, unspecified: Secondary | ICD-10-CM | POA: Diagnosis not present

## 2022-07-10 DIAGNOSIS — E1142 Type 2 diabetes mellitus with diabetic polyneuropathy: Secondary | ICD-10-CM | POA: Insufficient documentation

## 2022-07-10 DIAGNOSIS — I25110 Atherosclerotic heart disease of native coronary artery with unstable angina pectoris: Principal | ICD-10-CM | POA: Insufficient documentation

## 2022-07-10 DIAGNOSIS — Z7984 Long term (current) use of oral hypoglycemic drugs: Secondary | ICD-10-CM | POA: Insufficient documentation

## 2022-07-10 DIAGNOSIS — E1159 Type 2 diabetes mellitus with other circulatory complications: Secondary | ICD-10-CM

## 2022-07-10 DIAGNOSIS — E785 Hyperlipidemia, unspecified: Secondary | ICD-10-CM

## 2022-07-10 LAB — CBC
HCT: 37.3 % — ABNORMAL LOW (ref 39.0–52.0)
Hemoglobin: 12.2 g/dL — ABNORMAL LOW (ref 13.0–17.0)
MCH: 27.3 pg (ref 26.0–34.0)
MCHC: 32.7 g/dL (ref 30.0–36.0)
MCV: 83.4 fL (ref 80.0–100.0)
Platelets: 182 10*3/uL (ref 150–400)
RBC: 4.47 MIL/uL (ref 4.22–5.81)
RDW: 15.2 % (ref 11.5–15.5)
WBC: 6 10*3/uL (ref 4.0–10.5)
nRBC: 0 % (ref 0.0–0.2)

## 2022-07-10 LAB — BASIC METABOLIC PANEL
Anion gap: 11 (ref 5–15)
BUN: 9 mg/dL (ref 8–23)
CO2: 27 mmol/L (ref 22–32)
Calcium: 9.1 mg/dL (ref 8.9–10.3)
Chloride: 101 mmol/L (ref 98–111)
Creatinine, Ser: 0.96 mg/dL (ref 0.61–1.24)
GFR, Estimated: >60 mL/min (ref >60)
Glucose, Bld: 95 mg/dL (ref 70–99)
Potassium: 4.8 mmol/L (ref 3.5–5.1)
Sodium: 139 mmol/L (ref 135–145)

## 2022-07-10 LAB — TROPONIN I (HIGH SENSITIVITY): Troponin I (High Sensitivity): 4 ng/L (ref <18)

## 2022-07-10 MED ORDER — ACETAMINOPHEN 325 MG PO TABS
650.0000 mg | ORAL_TABLET | ORAL | Status: DC | PRN
  Filled 2022-07-10: qty 2

## 2022-07-10 MED ORDER — ASPIRIN 81 MG PO CHEW: 324.0000 mg | CHEWABLE_TABLET | Freq: Once | ORAL | Status: DC

## 2022-07-10 MED ORDER — ONDANSETRON HCL 4 MG/2ML IJ SOLN: 4.0000 mg | Freq: Four times a day (QID) | INTRAMUSCULAR | Status: DC | PRN

## 2022-07-10 MED ORDER — CARVEDILOL 6.25 MG PO TABS
6.2500 mg | ORAL_TABLET | Freq: Two times a day (BID) | ORAL | Status: DC
  Administered 2022-07-11 – 2022-07-12 (×3): 6.25 mg via ORAL
  Filled 2022-07-10: qty 2
  Filled 2022-07-10 (×2): qty 1

## 2022-07-10 MED ORDER — TAMSULOSIN HCL 0.4 MG PO CAPS
0.4000 mg | ORAL_CAPSULE | Freq: Every day | ORAL | Status: DC
  Administered 2022-07-11: 0.4 mg via ORAL
  Filled 2022-07-10: qty 1

## 2022-07-10 MED ORDER — DULOXETINE HCL 30 MG PO CPEP: 30.0000 mg | ORAL_CAPSULE | Freq: Every day | ORAL | Status: DC

## 2022-07-10 MED ORDER — PANTOPRAZOLE SODIUM 40 MG PO TBEC
40.0000 mg | DELAYED_RELEASE_TABLET | Freq: Every day | ORAL | Status: DC
  Administered 2022-07-11 – 2022-07-12 (×2): 40 mg via ORAL
  Filled 2022-07-10 (×2): qty 1

## 2022-07-10 MED ORDER — ATORVASTATIN CALCIUM 80 MG PO TABS
80.0000 mg | ORAL_TABLET | Freq: Every day | ORAL | Status: DC
  Administered 2022-07-11 – 2022-07-12 (×2): 80 mg via ORAL
  Filled 2022-07-10 (×2): qty 1

## 2022-07-10 MED ORDER — ENOXAPARIN SODIUM 40 MG/0.4ML IJ SOSY: 40.0000 mg | PREFILLED_SYRINGE | INTRAMUSCULAR | Status: DC

## 2022-07-10 MED ORDER — NITROGLYCERIN 0.4 MG SL SUBL: 0.4000 mg | SUBLINGUAL_TABLET | SUBLINGUAL | Status: DC | PRN

## 2022-07-10 MED ORDER — AMLODIPINE BESYLATE 2.5 MG PO TABS
2.5000 mg | ORAL_TABLET | Freq: Every day | ORAL | Status: DC
  Administered 2022-07-11 – 2022-07-12 (×2): 2.5 mg via ORAL
  Filled 2022-07-10 (×2): qty 1

## 2022-07-10 MED ORDER — ASPIRIN 81 MG PO TBEC: 81.0000 mg | DELAYED_RELEASE_TABLET | Freq: Every day | ORAL | Status: DC
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
## 2022-07-10 NOTE — ED Provider Triage Note (Signed)
Emergency Medicine Provider Triage Evaluation Note  Traveion Ruddock , a 76 y.o. male  was evaluated in triage.  Pt complains of intermittent chest pain the last 5 months.  Patient states the pain is usually resolved 1 hour after he takes sublingual nitro.  Today his chest pain started at 1 PM.  The pain is located on his central chest, sharp, nonradiating.  Patient was given nitro by EMS.  States the pain has gone.  Denies fever, shortness of breath, nausea, vomiting, bowel changes, urinary symptoms.  Review of Systems  Positive: As above Negative: As above  Physical Exam  Ht '5\' 6"'$  (1.676 m)   Wt 74 kg   BMI 26.33 kg/m  Gen:   Awake, no distress   Resp:  Normal effort  MSK:   Moves extremities without difficulty  Other:    Medical Decision Making  Medically screening exam initiated at 2:56 PM.  Appropriate orders placed.  Jovaun Cid-Prado was informed that the remainder of the evaluation will be completed by another provider, this initial triage assessment does not replace that evaluation, and the importance of remaining in the ED until their evaluation is complete.     Rex Kras, [place] 07/10/22 [id number]

## 2022-07-10 NOTE — Patient Instructions (Signed)
Medication Instructions:   Your physician recommends that you continue on your current medications as directed. Please refer to the Current Medication list given to you today.  *If you need a refill on your cardiac medications before your next appointment, please call your pharmacy*    Follow-Up:  [facility] FOR FURTHER CARDIAC WORK-UP OF UNSTABLE ANGINA   Important Information About Sugar
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
## 2022-07-10 NOTE — ED Notes (Signed)
Pt sent to ER from cardiology office for c/o chest pain with activity. No pain at this time. Patient is being admitted for unstable angina with plans for cardiac cath tomorrow.
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
## 2022-07-10 NOTE — Addendum Note (Signed)
Addended by: [name] on: 07/10/2022 07:38 PM   Modules accepted: Level of Service

## 2022-07-10 NOTE — ED Triage Notes (Signed)
Pt arrives via EMS from heart clinic with CP. Pt took 1 nitro. EMS gave 324 ASA and a 2nd nitro with improvement.  Using interpreter, pt reports the pain is gone. Denies any complaints at this time.
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
## 2022-07-11 ENCOUNTER — Encounter (HOSPITAL_COMMUNITY)
Admission: EM | Disposition: A | Discharge status: Home / Self Care | Payer: Self-pay | Source: Home / Self Care | Attending: Emergency Medicine

## 2022-07-11 ENCOUNTER — Observation Stay (HOSPITAL_BASED_OUTPATIENT_CLINIC_OR_DEPARTMENT_OTHER): Payer: Medicare Other

## 2022-07-11 DIAGNOSIS — R079 Chest pain, unspecified: Secondary | ICD-10-CM

## 2022-07-11 DIAGNOSIS — E785 Hyperlipidemia, unspecified: Secondary | ICD-10-CM | POA: Diagnosis not present

## 2022-07-11 DIAGNOSIS — I25110 Atherosclerotic heart disease of native coronary artery with unstable angina pectoris: Secondary | ICD-10-CM | POA: Diagnosis not present

## 2022-07-11 DIAGNOSIS — I10 Essential (primary) hypertension: Secondary | ICD-10-CM

## 2022-07-11 DIAGNOSIS — I200 Unstable angina: Secondary | ICD-10-CM | POA: Diagnosis not present

## 2022-07-11 HISTORY — PX: LEFT HEART CATH AND CORONARY ANGIOGRAPHY: CATH118249

## 2022-07-11 LAB — BASIC METABOLIC PANEL
Anion gap: 10 (ref 5–15)
BUN: 10 mg/dL (ref 8–23)
CO2: 24 mmol/L (ref 22–32)
Calcium: 8.8 mg/dL — ABNORMAL LOW (ref 8.9–10.3)
Chloride: 102 mmol/L (ref 98–111)
Creatinine, Ser: 0.93 mg/dL (ref 0.61–1.24)
GFR, Estimated: >60 mL/min (ref >60)
Glucose, Bld: 95 mg/dL (ref 70–99)
Potassium: 3.7 mmol/L (ref 3.5–5.1)
Sodium: 136 mmol/L (ref 135–145)

## 2022-07-11 LAB — ECHOCARDIOGRAM COMPLETE
Area-P 1/2: 2.95 cm2
Height: 66 in
S' Lateral: 2.2 cm
Weight: 2610.25 oz

## 2022-07-11 LAB — HEMOGLOBIN A1C
Hgb A1c MFr Bld: 7.1 % — ABNORMAL HIGH (ref 4.8–5.6)
Mean Plasma Glucose: 157.07 mg/dL

## 2022-07-11 LAB — TROPONIN I (HIGH SENSITIVITY): Troponin I (High Sensitivity): 8 ng/L (ref <18)

## 2022-07-11 SURGERY — LEFT HEART CATH AND CORONARY ANGIOGRAPHY
Anesthesia: LOCAL

## 2022-07-11 MED ORDER — IOHEXOL 350 MG/ML SOLN
INTRAVENOUS | Status: DC | PRN
  Administered 2022-07-11: 45 mL

## 2022-07-11 MED ORDER — VERAPAMIL HCL 2.5 MG/ML IV SOLN
INTRAVENOUS | Status: AC
  Filled 2022-07-11: qty 2

## 2022-07-11 MED ORDER — ASPIRIN 81 MG PO CHEW
81.0000 mg | CHEWABLE_TABLET | ORAL | Status: AC
  Administered 2022-07-11: 81 mg via ORAL
  Filled 2022-07-11: qty 1

## 2022-07-11 MED ORDER — HEPARIN SODIUM (PORCINE) 1000 UNIT/ML IJ SOLN
INTRAMUSCULAR | Status: DC | PRN
  Administered 2022-07-11: 4000 [IU] via INTRAVENOUS

## 2022-07-11 MED ORDER — SODIUM CHLORIDE 0.9% FLUSH
3.0000 mL | Freq: Two times a day (BID) | INTRAVENOUS | Status: DC
  Administered 2022-07-12: 3 mL via INTRAVENOUS

## 2022-07-11 MED ORDER — MIDAZOLAM HCL 2 MG/2ML IJ SOLN
INTRAMUSCULAR | Status: AC
  Filled 2022-07-11: qty 2

## 2022-07-11 MED ORDER — SODIUM CHLORIDE 0.9 % WEIGHT BASED INFUSION: 3.0000 mL/kg/h | INTRAVENOUS | Status: DC

## 2022-07-11 MED ORDER — SODIUM CHLORIDE 0.9% FLUSH: 3.0000 mL | Freq: Two times a day (BID) | INTRAVENOUS | Status: DC

## 2022-07-11 MED ORDER — HYDRALAZINE HCL 20 MG/ML IJ SOLN: 10.0000 mg | INTRAMUSCULAR | Status: AC | PRN

## 2022-07-11 MED ORDER — FENTANYL CITRATE (PF) 100 MCG/2ML IJ SOLN
INTRAMUSCULAR | Status: AC
  Filled 2022-07-11: qty 2

## 2022-07-11 MED ORDER — MIDAZOLAM HCL 2 MG/2ML IJ SOLN
INTRAMUSCULAR | Status: DC | PRN
  Administered 2022-07-11: 1 mg via INTRAVENOUS

## 2022-07-11 MED ORDER — PERFLUTREN LIPID MICROSPHERE
1.0000 mL | INTRAVENOUS | Status: AC | PRN
  Administered 2022-07-11: 3 mL via INTRAVENOUS

## 2022-07-11 MED ORDER — HEPARIN (PORCINE) IN NACL 1000-0.9 UT/500ML-% IV SOLN
INTRAVENOUS | Status: AC
  Filled 2022-07-11: qty 500

## 2022-07-11 MED ORDER — ASPIRIN 81 MG PO CHEW: 81.0000 mg | CHEWABLE_TABLET | ORAL | Status: DC

## 2022-07-11 MED ORDER — SODIUM CHLORIDE 0.9 % IV SOLN: INTRAVENOUS | Status: AC

## 2022-07-11 MED ORDER — LIDOCAINE HCL (PF) 1 % IJ SOLN
INTRAMUSCULAR | Status: AC
  Filled 2022-07-11: qty 30

## 2022-07-11 MED ORDER — VERAPAMIL HCL 2.5 MG/ML IV SOLN
INTRAVENOUS | Status: DC | PRN
  Administered 2022-07-11: 10 mL via INTRA_ARTERIAL

## 2022-07-11 MED ORDER — OXYCODONE HCL 5 MG PO TABS: 5.0000 mg | ORAL_TABLET | ORAL | Status: DC | PRN

## 2022-07-11 MED ORDER — LIDOCAINE HCL (PF) 1 % IJ SOLN
INTRAMUSCULAR | Status: DC | PRN
  Administered 2022-07-11: 2 mL via INTRADERMAL

## 2022-07-11 MED ORDER — SODIUM CHLORIDE 0.9 % WEIGHT BASED INFUSION: 1.0000 mL/kg/h | INTRAVENOUS | Status: DC

## 2022-07-11 MED ORDER — SODIUM CHLORIDE 0.9% FLUSH: 3.0000 mL | INTRAVENOUS | Status: DC | PRN

## 2022-07-11 MED ORDER — ENOXAPARIN SODIUM 40 MG/0.4ML IJ SOSY: 40.0000 mg | PREFILLED_SYRINGE | INTRAMUSCULAR | Status: DC

## 2022-07-11 MED ORDER — HEPARIN SODIUM (PORCINE) 1000 UNIT/ML IJ SOLN
INTRAMUSCULAR | Status: AC
  Filled 2022-07-11: qty 10

## 2022-07-11 MED ORDER — ONDANSETRON HCL 4 MG/2ML IJ SOLN: 4.0000 mg | Freq: Four times a day (QID) | INTRAMUSCULAR | Status: DC | PRN

## 2022-07-11 MED ORDER — HEPARIN (PORCINE) IN NACL 1000-0.9 UT/500ML-% IV SOLN
INTRAVENOUS | Status: DC | PRN
  Administered 2022-07-11 (×2): 500 mL

## 2022-07-11 MED ORDER — PREGABALIN 25 MG PO CAPS
25.0000 mg | ORAL_CAPSULE | Freq: Three times a day (TID) | ORAL | Status: DC
  Administered 2022-07-11 – 2022-07-12 (×4): 25 mg via ORAL
  Filled 2022-07-11 (×4): qty 1

## 2022-07-11 MED ORDER — FENTANYL CITRATE (PF) 100 MCG/2ML IJ SOLN
INTRAMUSCULAR | Status: DC | PRN
  Administered 2022-07-11: 25 ug via INTRAVENOUS

## 2022-07-11 MED ORDER — LABETALOL HCL 5 MG/ML IV SOLN: 10.0000 mg | INTRAVENOUS | Status: AC | PRN

## 2022-07-11 MED ORDER — ACETAMINOPHEN 325 MG PO TABS: 650.0000 mg | ORAL_TABLET | ORAL | Status: DC | PRN

## 2022-07-11 MED ORDER — SODIUM CHLORIDE 0.9 % IV SOLN: 250.0000 mL | INTRAVENOUS | Status: DC | PRN

## 2022-07-11 MED ORDER — ASPIRIN 81 MG PO TBEC
81.0000 mg | DELAYED_RELEASE_TABLET | Freq: Every day | ORAL | Status: DC
  Administered 2022-07-12: 81 mg via ORAL
  Filled 2022-07-11: qty 1

## 2022-07-11 SURGICAL SUPPLY — 10 items
CATH 5FR JL3.5 JR4 ANG PIG MP (CATHETERS) IMPLANT
DEVICE RAD COMP TR BAND LRG (VASCULAR PRODUCTS) IMPLANT
GLIDESHEATH SLEND A-KIT 6F 22G (SHEATH) IMPLANT
GUIDEWIRE INQWIRE 1.5J.035X260 (WIRE) IMPLANT
INQWIRE 1.5J .035X260CM (WIRE) ×1
KIT HEART LEFT (KITS) ×2 IMPLANT
PACK CARDIAC CATHETERIZATION (CUSTOM PROCEDURE TRAY) ×2 IMPLANT
SHEATH PROBE COVER 6X72 (BAG) IMPLANT
TRANSDUCER W/STOPCOCK (MISCELLANEOUS) ×2 IMPLANT
TUBING CIL FLEX 10 FLL-RA (TUBING) ×2 IMPLANT

## 2022-07-11 NOTE — ED Notes (Signed)
Patient ambulating to restroom

## 2022-07-11 NOTE — Progress Notes (Addendum)
Rounding Note    Patient Name: [name] Date of Encounter: 07/11/2022  [facility] Cardiologist: None   Subjective   BP 150/78, creatinine 0.9, hemoglobin 12.2. Denies any chest pain or dyspnea  Inpatient Medications    Scheduled Meds:  amLODipine  2.5 mg Oral Daily   atorvastatin  80 mg Oral Daily   carvedilol  6.25 mg Oral BID WC   enoxaparin (LOVENOX) injection  40 mg Subcutaneous Q24H   pantoprazole  40 mg Oral Daily   pregabalin  25 mg Oral TID   sodium chloride flush  3 mL Intravenous Q12H   tamsulosin  0.4 mg Oral QPC supper   Continuous Infusions:  sodium chloride     sodium chloride     PRN Meds: sodium chloride, acetaminophen, nitroGLYCERIN, ondansetron (ZOFRAN) IV, sodium chloride flush   Vital Signs    Vitals:   07/10/22 2313 07/10/22 2329 07/11/22 0153 07/11/22 0600  BP: (!) 162/86 (!) 161/82 (!) 168/80 (!) 150/78  Pulse: 82 84 83 72  Resp: '16  16 16  '$ Temp: 98 F (36.7 C)   98 F (36.7 C)  TempSrc: Oral   Oral  SpO2: 96% 97% 95% 95%  Weight:      Height:       No intake or output data in the 24 hours ending 07/11/22 0822    07/10/2022    2:19 PM 07/10/2022    1:15 PM 05/30/2022    9:30 AM  Last 3 Weights  Weight (lbs) 163 lb 2.3 oz 165 lb 3.2 oz 172 lb  Weight (kg) 74 kg 74.934 kg 78.019 kg      Telemetry    NSR - Personally Reviewed  ECG    NSR, rate 97, TWI in III - Personally Reviewed  Physical Exam   GEN: No acute distress.   Neck: No JVD Cardiac: RRR, no murmurs, rubs, or gallops.  Respiratory: Clear to auscultation bilaterally. GI: Soft, nontender, non-distended  MS: No edema; No deformity. Neuro:  Nonfocal  Psych: Normal affect   Labs    High Sensitivity Troponin:   Recent Labs  Lab 07/10/22 1509 07/11/22 0209  TROPONINIHS 4 8     Chemistry Recent Labs  Lab 07/10/22 1509 07/11/22 0209  NA 139 136  K 4.8 3.7  CL 101 102  CO2 27 24  GLUCOSE 95 95  BUN 9 10  CREATININE 0.96 0.93   CALCIUM 9.1 8.8*  GFRNONAA >60 >60  ANIONGAP 11 10    Lipids No results for input(s): "CHOL", "TRIG", "HDL", "LABVLDL", "LDLCALC", "CHOLHDL" in the last 168 hours.  Hematology Recent Labs  Lab 07/10/22 1509  WBC 6.0  RBC 4.47  HGB 12.2*  HCT 37.3*  MCV 83.4  MCH 27.3  MCHC 32.7  RDW 15.2  PLT 182   Thyroid No results for input(s): "TSH", "FREET4" in the last 168 hours.  BNPNo results for input(s): "BNP", "PROBNP" in the last 168 hours.  DDimer No results for input(s): "DDIMER" in the last 168 hours.   Radiology    DG Chest 2 View  Result Date: 07/10/2022 CLINICAL DATA:  Chest pain EXAM: CHEST - 2 VIEW COMPARISON:  12/16/2017 FINDINGS: Patient rotated to the right. Midline trachea. Mild cardiomegaly. No pleural effusion or pneumothorax. No congestive failure. Mild biapical pleural thickening. Clear lungs. Remote lower left rib fractures. IMPRESSION: Cardiomegaly without congestive failure. Electronically Signed   By: Abigail Miyamoto M.D.   On: 07/10/2022 15:35    Cardiac Studies  Patient Profile     76 y.o. male  with a hx of HTN, GERD, DMII, and SDH who presentst for further evaluation of chest pain.   Assessment & Plan    Unstable Angina: Seen in clinic by Dr. Johney Frame on 11/21.  Reported 9-monthhistory of exertional chest pain that improved with rest and nitroglycerin.  On presentation to clinic reported was having chest pain just walking into the office.   Troponins negative, EKG unremarkable.  Presentation consistent with unstable angina.  -Continue aspirin 81 mg daily -Continue atorvastatin 80 mg daily -Continue coreg 6.'25mg'$  BID -SL NG prn -Echocardiogram -Plan cardiac catheterization today. Risks and benefits of cardiac catheterization have been discussed with the patient.  These include bleeding, infection, kidney damage, stroke, heart attack, death.  The patient understands these risks and is willing to proceed.  History of SDH: Occurred in the past  following a fall. Appears to have resolved on subsequent imaging per Neuro note 07/2017.  HTN: Continue coreg 6.'25mg'$  BID and lisinopril '10mg'$  daily   HLD: Continue lipitor '80mg'$  daily.  LDL 58 in 11/2021   DMII: ISS   -Diet NPO for cath -DVT PPx: SQ lovenox -Code: Full  For questions or updates, please contact CPocaPlease consult [email or web address] for contact info under        Signed, CDonato Heinz MD  07/11/2022, 8:22 AM

## 2022-07-11 NOTE — Op Note (Signed)
25% mid LAD Coronaries otherwise normal. Normal LV function. Low normal LVEDP, 2 mmHg.

## 2022-07-11 NOTE — H&P (View-Only) (Signed)
Rounding Note    Patient Name: Alyx Gee Date of Encounter: 07/11/2022  [place] Cardiologist: None   Subjective   BP 150/78, creatinine 0.9, hemoglobin 12.2  Inpatient Medications    Scheduled Meds:  amLODipine  2.5 mg Oral Daily   atorvastatin  80 mg Oral Daily   carvedilol  6.25 mg Oral BID WC   enoxaparin (LOVENOX) injection  40 mg Subcutaneous Q24H   pantoprazole  40 mg Oral Daily   pregabalin  25 mg Oral TID   sodium chloride flush  3 mL Intravenous Q12H   tamsulosin  0.4 mg Oral QPC supper   Continuous Infusions:  sodium chloride     sodium chloride     PRN Meds: sodium chloride, acetaminophen, nitroGLYCERIN, ondansetron (ZOFRAN) IV, sodium chloride flush   Vital Signs    Vitals:   07/10/22 2313 07/10/22 2329 07/11/22 0153 07/11/22 0600  BP: (!) 162/86 (!) 161/82 (!) 168/80 (!) 150/78  Pulse: 82 84 83 72  Resp: '16  16 16  '$ Temp: 98 F (36.7 C)   98 F (36.7 C)  TempSrc: Oral   Oral  SpO2: 96% 97% 95% 95%  Weight:      Height:       No intake or output data in the 24 hours ending 07/11/22 0822    07/10/2022    2:19 PM 07/10/2022    1:15 PM 05/30/2022    9:30 AM  Last 3 Weights  Weight (lbs) 163 lb 2.3 oz 165 lb 3.2 oz 172 lb  Weight (kg) 74 kg 74.934 kg 78.019 kg      Telemetry    NSR - Personally Reviewed  ECG    NSR, rate 97, TWI in III - Personally Reviewed  Physical Exam   GEN: No acute distress.   Neck: No JVD Cardiac: RRR, no murmurs, rubs, or gallops.  Respiratory: Clear to auscultation bilaterally. GI: Soft, nontender, non-distended  MS: No edema; No deformity. Neuro:  Nonfocal  Psych: Normal affect   Labs    High Sensitivity Troponin:   Recent Labs  Lab 07/10/22 1509 07/11/22 0209  TROPONINIHS 4 8     Chemistry Recent Labs  Lab 07/10/22 1509 07/11/22 0209  NA 139 136  K 4.8 3.7  CL 101 102  CO2 27 24  GLUCOSE 95 95  BUN 9 10  CREATININE 0.96 0.93  CALCIUM 9.1 8.8*  GFRNONAA >60  >60  ANIONGAP 11 10    Lipids No results for input(s): "CHOL", "TRIG", "HDL", "LABVLDL", "LDLCALC", "CHOLHDL" in the last 168 hours.  Hematology Recent Labs  Lab 07/10/22 1509  WBC 6.0  RBC 4.47  HGB 12.2*  HCT 37.3*  MCV 83.4  MCH 27.3  MCHC 32.7  RDW 15.2  PLT 182   Thyroid No results for input(s): "TSH", "FREET4" in the last 168 hours.  BNPNo results for input(s): "BNP", "PROBNP" in the last 168 hours.  DDimer No results for input(s): "DDIMER" in the last 168 hours.   Radiology    DG Chest 2 View  Result Date: 07/10/2022 CLINICAL DATA:  Chest pain EXAM: CHEST - 2 VIEW COMPARISON:  12/16/2017 FINDINGS: Patient rotated to the right. Midline trachea. Mild cardiomegaly. No pleural effusion or pneumothorax. No congestive failure. Mild biapical pleural thickening. Clear lungs. Remote lower left rib fractures. IMPRESSION: Cardiomegaly without congestive failure. Electronically Signed   By: Abigail Miyamoto M.D.   On: 07/10/2022 15:35    Cardiac Studies     Patient Profile  76 y.o. male  with a hx of HTN, GERD, DMII, and SDH who presentst for further evaluation of chest pain.   Assessment & Plan    Unstable Angina: Seen in clinic by Dr. Johney Frame on 11/21.  Reported 88-monthhistory of exertional chest pain that improved with rest and nitroglycerin.  On presentation to clinic reported was having chest pain just walking into the office.   Troponins negative, EKG unremarkable.  Presentation consistent with unstable angina.  -Continue aspirin 81 mg daily -Continue atorvastatin 80 mg daily -Continue coreg 6.'25mg'$  BID -SL NG prn -Echocardiogram -Plan cardiac catheterization today. Risks and benefits of cardiac catheterization have been discussed with the patient.  These include bleeding, infection, kidney damage, stroke, heart attack, death.  The patient understands these risks and is willing to proceed.  History of SDH: Occurred in the past following a fall. Appears to have  resolved on subsequent imaging per Neuro note 07/2017.  HTN: Continue coreg 6.'25mg'$  BID and lisinopril '10mg'$  daily   HLD: Continue lipitor '80mg'$  daily.  LDL 58 in 11/2021   DMII: ISS   -Diet NPO for cath -DVT PPx: SQ lovenox -Code: Full  For questions or updates, please contact CSecretaryPlease consult [email or web address] for contact info under        Signed, CDonato Heinz MD  07/11/2022, 8:22 AM

## 2022-07-11 NOTE — Progress Notes (Signed)
*  PRELIMINARY RESULTS* Echocardiogram 2D Echocardiogram has been performed with Definity.  [name] 07/11/2022, 2:24 PM

## 2022-07-11 NOTE — Interval H&P Note (Signed)
Cath Lab Visit (complete for each Cath Lab visit)  Clinical Evaluation Leading to the Procedure:   ACS: Yes.    Non-ACS:    Anginal Classification: CCS IV  Anti-ischemic medical therapy: Minimal Therapy (1 class of medications)  Non-Invasive Test Results: No non-invasive testing performed  Prior CABG: No previous CABG      History and Physical Interval Note:  07/11/2022 2:58 PM  [name]  has presented today for surgery, with the diagnosis of unstable angina.  The various methods of treatment have been discussed with the patient and family. After consideration of risks, benefits and other options for treatment, the patient has consented to  Procedure(s): LEFT HEART CATH AND CORONARY ANGIOGRAPHY (N/A) as a surgical intervention.  The patient's history has been reviewed, patient examined, no change in status, stable for surgery.  I have reviewed the patient's chart and labs.  Questions were answered to the patient's satisfaction.     Belva Crome III

## 2022-07-11 NOTE — ED Notes (Addendum)
Patient ambulated to restroom. Changed into a gown and IV access obtained. Denies any needs or concerns at this time

## 2022-07-12 DIAGNOSIS — I25110 Atherosclerotic heart disease of native coronary artery with unstable angina pectoris: Secondary | ICD-10-CM | POA: Diagnosis not present

## 2022-07-12 DIAGNOSIS — I200 Unstable angina: Secondary | ICD-10-CM | POA: Diagnosis not present

## 2022-07-12 MED ORDER — NITROGLYCERIN 0.4 MG SL SUBL: 0.4000 mg | SUBLINGUAL_TABLET | SUBLINGUAL | 2 refills | Status: DC | PRN

## 2022-07-12 MED ORDER — METFORMIN HCL ER 500 MG PO TB24: 500.0000 mg | ORAL_TABLET | Freq: Every day | ORAL | 2 refills | Status: DC

## 2022-07-12 MED ORDER — ATORVASTATIN CALCIUM 80 MG PO TABS: 80.0000 mg | ORAL_TABLET | Freq: Every day | ORAL | 3 refills | Status: DC

## 2022-07-12 MED ORDER — CARVEDILOL 12.5 MG PO TABS: 12.5000 mg | ORAL_TABLET | Freq: Two times a day (BID) | ORAL | 3 refills | Status: DC

## 2022-07-12 MED ORDER — ASPIRIN 81 MG PO TBEC: 81.0000 mg | DELAYED_RELEASE_TABLET | Freq: Every day | ORAL | 12 refills | Status: DC

## 2022-07-12 MED ORDER — AMLODIPINE BESYLATE 2.5 MG PO TABS: 2.5000 mg | ORAL_TABLET | Freq: Every day | ORAL | 3 refills | Status: DC

## 2022-07-12 NOTE — Progress Notes (Signed)
Rounding Note    Patient Name: [name] Date of Encounter: 07/12/2022  [place] Cardiologist: None   Subjective   Feeling well today.  Blood pressure remains elevated.  Left heart catheterization with nonobstructive coronary artery disease.  Echo with no major abnormalities.  Inpatient Medications    Scheduled Meds:  amLODipine  2.5 mg Oral Daily   aspirin EC  81 mg Oral Daily   atorvastatin  80 mg Oral Daily   carvedilol  6.25 mg Oral BID WC   enoxaparin (LOVENOX) injection  40 mg Subcutaneous Q24H   pantoprazole  40 mg Oral Daily   pregabalin  25 mg Oral TID   sodium chloride flush  3 mL Intravenous Q12H   tamsulosin  0.4 mg Oral QPC supper   Continuous Infusions:  sodium chloride     PRN Meds: sodium chloride, acetaminophen, nitroGLYCERIN, ondansetron (ZOFRAN) IV, oxyCODONE, sodium chloride flush   Vital Signs    Vitals:   07/11/22 2102 07/12/22 0011 07/12/22 0520 07/12/22 0815  BP: 129/82 (!) 141/78 132/67 (!) 144/60  Pulse: 73 62 73 78  Resp: '19 17 19 16  '$ Temp: 14.7 F (82.9 C) 98 F (36.7 C) 98 F (36.7 C) 98.1 F (36.7 C)  TempSrc: Oral Oral Oral Oral  SpO2: 95% 97% 96% 98%  Weight:      Height:        Intake/Output Summary (Last 24 hours) at 07/12/2022 0911 Last data filed at 07/12/2022 0800 Gross per 24 hour  Intake 921.6 ml  Output --  Net 921.6 ml      07/10/2022    2:19 PM 07/10/2022    1:15 PM 05/30/2022    9:30 AM  Last 3 Weights  Weight (lbs) 163 lb 2.3 oz 165 lb 3.2 oz 172 lb  Weight (kg) 74 kg 74.934 kg 78.019 kg      Telemetry    Sinus rhythm with sinus tachycardia-personally reviewed  ECG    Sinus rhythm, rate 97-personally reviewed  Physical Exam   GEN: Well nourished, well developed, in no acute distress  HEENT: normal  Neck: no JVD, carotid bruits, or masses Cardiac: RRR; no murmurs, rubs, or gallops,no edema  Respiratory:  clear to auscultation bilaterally, normal work of breathing GI:  soft, nontender, nondistended, + BS MS: no deformity or atrophy  Skin: warm and dry Neuro:  Strength and sensation are intact Psych: euthymic mood, full affect   Labs    High Sensitivity Troponin:   Recent Labs  Lab 07/10/22 1509 07/11/22 0209  TROPONINIHS 4 8      Chemistry Recent Labs  Lab 07/10/22 1509 07/11/22 0209  NA 139 136  K 4.8 3.7  CL 101 102  CO2 27 24  GLUCOSE 95 95  BUN 9 10  CREATININE 0.96 0.93  CALCIUM 9.1 8.8*  GFRNONAA >60 >60  ANIONGAP 11 10     Lipids No results for input(s): "CHOL", "TRIG", "HDL", "LABVLDL", "LDLCALC", "CHOLHDL" in the last 168 hours.  Hematology Recent Labs  Lab 07/10/22 1509  WBC 6.0  RBC 4.47  HGB 12.2*  HCT 37.3*  MCV 83.4  MCH 27.3  MCHC 32.7  RDW 15.2  PLT 182    Thyroid No results for input(s): "TSH", "FREET4" in the last 168 hours.  BNPNo results for input(s): "BNP", "PROBNP" in the last 168 hours.  DDimer No results for input(s): "DDIMER" in the last 168 hours.   Radiology    CARDIAC CATHETERIZATION  Result Date: 07/11/2022  Conclusions: Right dominant coronary anatomy. Focal 25 to 30% LAD narrowing in the mid vessel. Coronary arteries are otherwise widely patent Normal LV function and low normal LVEDP of 2 mmHg.  EF estimated to be 55%. RECOMMENDATIONS: Risk factor modification Coronary microvascular dysfunction was considered but felt not to be helpful because of brisk coronary flow in all territories and significant myocardial blush noted in both the left and right coronary systems.  ECHOCARDIOGRAM COMPLETE  Result Date: 07/11/2022    ECHOCARDIOGRAM REPORT   Patient Name:   [name] Date of Exam: 07/11/2022 Medical Rec #:  [id number]          Height:       66.0 in Accession #:    [id number]         Weight:       163.1 lb Date of Birth:  03/22/1946         BSA:          1.834 m Patient Age:    54 years           BP:           157/83 mmHg Patient Gender: M                  HR:           83 bpm.  Exam Location:  Inpatient Procedure: 2D Echo, Cardiac Doppler and Color Doppler Indications:    R07.9 Chest Pain  History:        Patient has no prior history of Echocardiogram examinations.                 CAD; Risk Factors:Hypertension, Diabetes and Dyslipidemia.  Sonographer:    Alvino Chapel RCS Referring Phys: [place]  1. Left ventricular ejection fraction, by estimation, is 65 to 70%. The left ventricle has normal function. The left ventricle has no regional wall motion abnormalities. There is mild concentric left ventricular hypertrophy. Left ventricular diastolic parameters are indeterminate. Elevated left atrial pressure.  2. Right ventricular systolic function is hyperdynamic. The right ventricular size is normal.  3. The mitral valve is normal in structure. No evidence of mitral valve regurgitation. No evidence of mitral stenosis.  4. The aortic valve is tricuspid. Aortic valve regurgitation is not visualized. No aortic stenosis is present.  5. The inferior vena cava is normal in size with greater than 50% respiratory variability, suggesting right atrial pressure of 3 mmHg. FINDINGS  Left Ventricle: Left ventricular ejection fraction, by estimation, is 65 to 70%. The left ventricle has normal function. The left ventricle has no regional wall motion abnormalities. Definity contrast agent was given IV to delineate the left ventricular  endocardial borders. The left ventricular internal cavity size was normal in size. There is mild concentric left ventricular hypertrophy. Left ventricular diastolic parameters are indeterminate. Elevated left atrial pressure. Right Ventricle: The right ventricular size is normal. No increase in right ventricular wall thickness. Right ventricular systolic function is hyperdynamic. Left Atrium: Left atrial size was normal in size. Right Atrium: Right atrial size was normal in size. Pericardium: Trivial pericardial effusion is present. Mitral Valve: The  mitral valve is normal in structure. No evidence of mitral valve regurgitation. No evidence of mitral valve stenosis. Tricuspid Valve: The tricuspid valve is normal in structure. Tricuspid valve regurgitation is not demonstrated. No evidence of tricuspid stenosis. Aortic Valve: The aortic valve is tricuspid. Aortic valve regurgitation is not visualized. No aortic stenosis is present. Pulmonic Valve:  The pulmonic valve was not well visualized. Pulmonic valve regurgitation is not visualized. No evidence of pulmonic stenosis. Aorta: The aortic root is normal in size and structure. Venous: The inferior vena cava is normal in size with greater than 50% respiratory variability, suggesting right atrial pressure of 3 mmHg. IAS/Shunts: No atrial level shunt detected by color flow Doppler.  LEFT VENTRICLE PLAX 2D LVIDd:         4.30 cm   Diastology LVIDs:         2.20 cm   LV e' medial:    5.11 cm/s LV PW:         1.10 cm   LV E/e' medial:  19.3 LV IVS:        1.10 cm   LV e' lateral:   5.55 cm/s LVOT diam:     1.70 cm   LV E/e' lateral: 17.7 LV SV:         51 LV SV Index:   28 LVOT Area:     2.27 cm  RIGHT VENTRICLE RV S prime:     18.20 cm/s TAPSE (M-mode): 2.0 cm LEFT ATRIUM             Index        RIGHT ATRIUM           Index LA diam:        3.30 cm 1.80 cm/m   RA Area:     11.80 cm LA Vol (A2C):   30.3 ml 16.52 ml/m  RA Volume:   24.30 ml  13.25 ml/m LA Vol (A4C):   32.9 ml 17.94 ml/m LA Biplane Vol: 31.5 ml 17.18 ml/m  AORTIC VALVE LVOT Vmax:   123.00 cm/s LVOT Vmean:  75.200 cm/s LVOT VTI:    0.226 m  AORTA Ao Root diam: 2.50 cm MITRAL VALVE MV Area (PHT): 2.95 cm     SHUNTS MV Decel Time: 257 msec     Systemic VTI:  0.23 m MV E velocity: 98.50 cm/s   Systemic Diam: 1.70 cm MV A velocity: 132.00 cm/s MV E/A ratio:  0.75 Rudean Haskell MD Electronically signed by Rudean Haskell MD Signature Date/Time: 07/11/2022/2:57:44 PM    Final    DG Chest 2 View  Result Date: 07/10/2022 CLINICAL DATA:   Chest pain EXAM: CHEST - 2 VIEW COMPARISON:  12/16/2017 FINDINGS: Patient rotated to the right. Midline trachea. Mild cardiomegaly. No pleural effusion or pneumothorax. No congestive failure. Mild biapical pleural thickening. Clear lungs. Remote lower left rib fractures. IMPRESSION: Cardiomegaly without congestive failure. Electronically Signed   By: Abigail Miyamoto M.D.   On: 07/10/2022 15:35    Cardiac Studies   LHC Right dominant coronary anatomy. Focal 25 to 30% LAD narrowing in the mid vessel. Coronary arteries are otherwise widely patent Normal LV function and low normal LVEDP of 2 mmHg.  EF estimated to be 55%.  TTE  1. Left ventricular ejection fraction, by estimation, is 65 to 70%. The  left ventricle has normal function. The left ventricle has no regional  wall motion abnormalities. There is mild concentric left ventricular  hypertrophy. Left ventricular diastolic  parameters are indeterminate. Elevated left atrial pressure.   2. Right ventricular systolic function is hyperdynamic. The right  ventricular size is normal.   3. The mitral valve is normal in structure. No evidence of mitral valve  regurgitation. No evidence of mitral stenosis.   4. The aortic valve is tricuspid. Aortic valve regurgitation is not  visualized. No  aortic stenosis is present.   5. The inferior vena cava is normal in size with greater than 50%  respiratory variability, suggesting right atrial pressure of 3 mmHg.   Patient Profile     76 y.o. male  with a hx of HTN, GERD, DMII, and SDH who presentst for further evaluation of chest pain.   Assessment & Plan    1.  Unstable angina: 71-monthhistory of exertional chest pain improved with rest and nitroglycerin.  He is continuing to have chest discomfort.  Left heart catheterization with no major epicardial coronary issue.  Does not appear to have prevascular dysfunction.  Montie Gelardi increase carvedilol to 12.5 mg twice daily.  We Earvin Blazier continue atorvastatin and  aspirin.  Plan for follow-up in cardiology clinic.  2.  History of subdural hematoma: Occurred post fall.  Has resolved.  3.  Hypertension: Blood pressure elevated.  Planning to increase carvedilol and continue lisinopril.  4.  Hyperlipidemia: Continue Lipitor 80 mg daily  5.  Type 2 diabetes: We Dyana Magner resume home medications at discharge.   For questions or updates, please contact [place]Please consult [email or web address] for contact info under        Signed, Ott Zimmerle MMeredith Leeds MD  07/12/2022, 9:11 AM

## 2022-07-12 NOTE — Discharge Summary (Addendum)
Discharge Summary    Patient ID: [name] MRN: [id number]; DOB: [birth year]  Admit date: 07/10/2022 Discharge date: 07/12/2022  PCP:  Pcp, No   [place] Prov[facility] Cardiologist:  Freada Bergeron, MD   Discharge Diagnoses    Principal Problem:   Unstable angina [facility]) Active Problems:   EMPHYSEMA   Type 2 diabetes mellitus without complication, without long-term current use of insulin ([place])   Coronary artery disease involving native coronary artery of native heart without angina pectoris   Hyperlipidemia   Diagnostic Studies/Procedures    Heart cath 28-Jul-2022: Conclusions: Right dominant coronary anatomy. Focal 25 to 30% LAD narrowing in the mid vessel. Coronary arteries are otherwise widely patent Normal LV function and low normal LVEDP of 2 mmHg.  EF estimated to be 55%.   RECOMMENDATIONS:   Risk factor modification Coronary microvascular dysfunction was considered but felt not to be helpful because of brisk coronary flow in all territories and significant myocardial blush noted in both the left and right coronary systems. _____________   Echo 07/28/2022: 1. Left ventricular ejection fraction, by estimation, is 65 to 70%. The  left ventricle has normal function. The left ventricle has no regional  wall motion abnormalities. There is mild concentric left ventricular  hypertrophy. Left ventricular diastolic  parameters are indeterminate. Elevated left atrial pressure.   2. Right ventricular systolic function is hyperdynamic. The right  ventricular size is normal.   3. The mitral valve is normal in structure. No evidence of mitral valve  regurgitation. No evidence of mitral stenosis.   4. The aortic valve is tricuspid. Aortic valve regurgitation is not  visualized. No aortic stenosis is present.   5. The inferior vena cava is normal in size with greater than 50%  respiratory variability, suggesting right atrial pressure of 3 mmHg.     History of Present Illness     [name] is a 76 y.o. male with a hx of HTN, GERD, DMII, and SDH who was referred by Dr. Evette Doffing for further evaluation of chest pain.   Patient was seen in clinic on 05/30/22. Note reviewed. He was experiencing sharp chest pain and chest tightness that occurred both at rest and with exertion. Given concern for possible angina, he is now referred to Cardiology for further management.   On 07/10/22, the patient was seen in the cardiology clinic accompanied by a professional interpreter. He states he began noticing chest tightness about 5 months ago. Symptoms have been progressive and have been increasing in frequency over the past several weeks. Symptoms worsen with exertion and he is unable to do minimal activity without triggering his symptoms. He also states he is now having episodes at rest but they are "not as severe." He has been prescribed nitro which he has been taking and it helps significantly.    Currently, he is having chest tightness from walking into the office. He took a nitro during examination and his pain improved.    On 2 occasions he has lost his balance and fallen due to his chest pain/tightness. He also confirms prior LOC which he attributes to the pain.   He has a Hx of type 2 diabetes. His most recent A1c was 6.3. He denies Hx of MI or heart disease.   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 Patient recalls an accident that occurred in 2006/07  during which he was unconscious for a couple of hours. 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 He  states that he forgets what he says by the time he leaves the room.    Patient denies smoking.   He denies any palpitations, shortness of breath, or peripheral edema. No lightheadedness, headaches, syncope, orthopnea, or PND.   Hospital Course     Consultants: none  Chest pain Symptoms concerning for unstable angina. She was admitted to cardiology with plans  for definitive angiography. Left heart cath 07/11/22 with focal 25-30% LAD narrowing in the mid vessel, coronaries otherwise widely patent. LVEDP 2 mmHg. Echocardiogram with LVEF 65-70% with elevated left atrial pressure, normal RV function, and no significant valvular disease. He continued to have chest pain after procedure. He was started on low dose amlodipine and coreg increased to 12.5 mg BID.  PRN nitro, continue ASA Troponin remained negative.    Hypertension Amlodipine 2.5 daily and 12.5 mg coreg BID Follow pressure   Hyperlipidemia with LDL goal < 70 12/06/2021: Cholesterol, Total 114; HDL 39; LDL Chol Calc (NIH) 58; Triglycerides 84 Continue lipitor.    DM2 Continue home medications A1c 7.1% Restart metformin in 48 hrs.   Pt was seen and examined by Dr. Curt Bears and deemed stable for discharge. The office has been messaged for a follow up.     Did the patient have an acute coronary syndrome (MI, NSTEMI, STEMI, etc) this admission?:  No                               Did the patient have a percutaneous coronary intervention (stent / angioplasty)?:  No.        The patient [name] be scheduled for a TOC follow up appointment in 7-14 days.  A message has been sent to the [facility] and Scheduling Pool at the office where the patient should be seen for follow up.  _____________  Discharge Vitals Blood pressure (!) 144/60, pulse 78, temperature 98.1 F (36.7 C), temperature source Oral, resp. rate 16, height 5' 6" (1.676 m), weight 74 kg, SpO2 98 %.  Filed Weights   07/10/22 1419  Weight: 74 kg    Labs & Radiologic Studies    CBC Recent Labs    07/10/22 1509  WBC 6.0  HGB 12.2*  HCT 37.3*  MCV 83.4  PLT 366   Basic Metabolic Panel Recent Labs    07/10/22 1509 07/11/22 0209  NA 139 136  K 4.8 3.7  CL 101 102  CO2 27 24  GLUCOSE 95 95  BUN 9 10  CREATININE 0.96 0.93  CALCIUM 9.1 8.8*   Liver Function Tests No results for input(s): "AST", "ALT", "ALKPHOS",  "BILITOT", "PROT", "ALBUMIN" in the last 72 hours. No results for input(s): "LIPASE", "AMYLASE" in the last 72 hours. High Sensitivity Troponin:   Recent Labs  Lab 07/10/22 1509 07/11/22 0209  TROPONINIHS 4 8    BNP Invalid input(s): "POCBNP" D-Dimer No results for input(s): "DDIMER" in the last 72 hours. Hemoglobin A1C Recent Labs    07/11/22 0209  HGBA1C 7.1*   Fasting Lipid Panel No results for input(s): "CHOL", "HDL", "LDLCALC", "TRIG", "CHOLHDL", "LDLDIRECT" in the last 72 hours. Thyroid Function Tests No results for input(s): "TSH", "T4TOTAL", "T3FREE", "THYROIDAB" in the last 72 hours.  Invalid input(s): "FREET3" _____________  CARDIAC CATHETERIZATION  Result Date: 07/11/2022 Conclusions: Right dominant coronary anatomy. Focal 25 to 30% LAD narrowing in the mid vessel. Coronary arteries are otherwise widely patent Normal LV function and low normal LVEDP of 2 mmHg.  EF estimated to be 55%. RECOMMENDATIONS: Risk factor modification Coronary microvascular dysfunction was considered but felt not to be helpful because of brisk coronary flow in all territories and significant myocardial blush noted in both the left and right coronary systems.  ECHOCARDIOGRAM COMPLETE  Result Date: 07/11/2022    ECHOCARDIOGRAM REPORT   Patient Name:   [name] Date of Exam: 07/11/2022 Medical Rec #:  [id number]          Height:       66.0 in Accession #:    [id number]         Weight:       163.1 lb Date of Birth:  [birth year]         BSA:          1.834 m Patient Age:    76 years           BP:           157/83 mmHg Patient Gender: M                  HR:           83 bpm. Exam Location:  Inpatient Procedure: 2D Echo, Cardiac Doppler and Color Doppler Indications:    R07.9 Chest Pain  History:        Patient has no prior history of Echocardiogram examinations.                 CAD; Risk Factors:Hypertension, Diabetes and Dyslipidemia.  Sonographer:    Alvino Chapel RCS Referring Phys: [place]  1. Left ventricular ejection fraction, by estimation, is 65 to 70%. The left ventricle has normal function. The left ventricle has no regional wall motion abnormalities. There is mild concentric left ventricular hypertrophy. Left ventricular diastolic parameters are indeterminate. Elevated left atrial pressure.  2. Right ventricular systolic function is hyperdynamic. The right ventricular size is normal.  3. The mitral valve is normal in structure. No evidence of mitral valve regurgitation. No evidence of mitral stenosis.  4. The aortic valve is tricuspid. Aortic valve regurgitation is not visualized. No aortic stenosis is present.  5. The inferior vena cava is normal in size with greater than 50% respiratory variability, suggesting right atrial pressure of 3 mmHg. FINDINGS  Left Ventricle: Left ventricular ejection fraction, by estimation, is 65 to 70%. The left ventricle has normal function. The left ventricle has no regional wall motion abnormalities. Definity contrast agent was given IV to delineate the left ventricular  endocardial borders. The left ventricular internal cavity size was normal in size. There is mild concentric left ventricular hypertrophy. Left ventricular diastolic parameters are indeterminate. Elevated left atrial pressure. Right Ventricle: The right ventricular size is normal. No increase in right ventricular wall thickness. Right ventricular systolic function is hyperdynamic. Left Atrium: Left atrial size was normal in size. Right Atrium: Right atrial size was normal in size. Pericardium: Trivial pericardial effusion is present. Mitral Valve: The mitral valve is normal in structure. No evidence of mitral valve regurgitation. No evidence of mitral valve stenosis. Tricuspid Valve: The tricuspid valve is normal in structure. Tricuspid valve regurgitation is not demonstrated. No evidence of tricuspid stenosis. Aortic Valve: The aortic valve is tricuspid. Aortic  valve regurgitation is not visualized. No aortic stenosis is present. Pulmonic Valve: The pulmonic valve was not well visualized. Pulmonic valve regurgitation is not visualized. No evidence of pulmonic stenosis. Aorta: The aortic root is normal in size and structure. Venous: The inferior vena  cava is normal in size with greater than 50% respiratory variability, suggesting right atrial pressure of 3 mmHg. IAS/Shunts: No atrial level shunt detected by color flow Doppler.  LEFT VENTRICLE PLAX 2D LVIDd:         4.30 cm   Diastology LVIDs:         2.20 cm   LV e' medial:    5.11 cm/s LV PW:         1.10 cm   LV E/e' medial:  19.3 LV IVS:        1.10 cm   LV e' lateral:   5.55 cm/s LVOT diam:     1.70 cm   LV E/e' lateral: 17.7 LV SV:         51 LV SV Index:   28 LVOT Area:     2.27 cm  RIGHT VENTRICLE RV S prime:     18.20 cm/s TAPSE (M-mode): 2.0 cm LEFT ATRIUM             Index        RIGHT ATRIUM           Index LA diam:        3.30 cm 1.80 cm/m   RA Area:     11.80 cm LA Vol (A2C):   30.3 ml 16.52 ml/m  RA Volume:   24.30 ml  13.25 ml/m LA Vol (A4C):   32.9 ml 17.94 ml/m LA Biplane Vol: 31.5 ml 17.18 ml/m  AORTIC VALVE LVOT Vmax:   123.00 cm/s LVOT Vmean:  75.200 cm/s LVOT VTI:    0.226 m  AORTA Ao Root diam: 2.50 cm MITRAL VALVE MV Area (PHT): 2.95 cm     SHUNTS MV Decel Time: 257 msec     Systemic VTI:  0.23 m MV E velocity: 98.50 cm/s   Systemic Diam: 1.70 cm MV A velocity: 132.00 cm/s MV E/A ratio:  0.75 Rudean Haskell MD Electronically signed by Rudean Haskell MD Signature Date/Time: 07/11/2022/2:57:44 PM    Final    DG Chest 2 View  Result Date: 07/10/2022 CLINICAL DATA:  Chest pain EXAM: CHEST - 2 VIEW COMPARISON:  12/16/2017 FINDINGS: Patient rotated to the right. Midline trachea. Mild cardiomegaly. No pleural effusion or pneumothorax. No congestive failure. Mild biapical pleural thickening. Clear lungs. Remote lower left rib fractures. IMPRESSION: Cardiomegaly without congestive  failure. Electronically Signed   By: Abigail Miyamoto M.D.   On: 07/10/2022 15:35   Disposition   Pt is being discharged home today in good condition.  Follow-up Plans & Appointments     Follow-up Information     Elgie Collard, PA-C Follow up on 07/23/2022.   Specialty: Cardiology Why: 10:05 AM Contact information: [address] [place] [address] [phone]                Discharge Instructions     Diet - low sodium heart healthy   Complete by: As directed    Discharge instructions   Complete by: As directed    No driving for 2 days. No lifting over 5 lbs for 1 week. No sexual activity for 1 week. Keep procedure site clean & dry. If you notice increased pain, swelling, bleeding or pus, call/return!  You may shower, but no soaking baths/hot tubs/pools for 1 week.   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 Start taking on: July 13, 2022   aspirin EC 81 MG tablet Take 1 tablet (81 mg total) by mouth daily. Swallow whole. Start taking on: July 13, 2022   atorvastatin 80 MG tablet Commonly known as: LIPITOR Take 1 tablet (80 mg total) by mouth daily. Start taking on: July 13, 2022 What changed: See the new instructions.   carvedilol 12.5 MG tablet Commonly known as: COREG Take 1 tablet (12.5 mg total) by mouth 2  (two) times daily with a meal. What changed:  medication strength how much to take when to take this   DULoxetine 30 MG capsule Commonly known as: CYMBALTA Take 1 capsule by mouth daily.   fluticasone 50 MCG/ACT nasal spray Commonly known as: FLONASE SPRAY 1 SPRAY INTO EACH NOSTRIL EVERY DAY   loperamide 2 MG tablet Commonly known as: Imodium A-D Take 1 tablet (2 mg total) by mouth 4 (four) times daily as needed for diarrhea or loose stools.   metFORMIN 500 MG 24 hr tablet Commonly known as: GLUCOPHAGE-XR Take 1 tablet (500 mg total) by mouth daily with breakfast. Restart on 07/15/22. What changed: additional instructions   nitroGLYCERIN 0.4 MG SL tablet Commonly known as: NITROSTAT Place 1 tablet (0.4 mg total) under the tongue every 5 (five) minutes x 3 doses as needed for chest pain. What changed:  when to take this reasons to take this   omeprazole 20 MG capsule Commonly known as: PRILOSEC TAKE 1 CAPSULE BY MOUTH EVERY DAY   OneTouch Delica Lancets 37C Misc Use to check blood sugar 2 times daily before meals.   OneTouch Verio test strip Generic drug: glucose blood revisar el azucar en la sangre diariamente   OneTouch Verio test strip Generic drug: glucose blood REVISAR EL AZUCAR EN LA SANGRE DIARIAMENTE   OneTouch Verio w/Device Kit 1 each by Does not apply route daily.   pregabalin 25 MG capsule Commonly known as: LYRICA TAKE 1 CAPSULE BY MOUTH THREE TIMES A DAY   tamsulosin 0.4 MG Caps capsule Commonly known as: FLOMAX TOME 1 CAPSULA POR LA BOCA DIARIO           Outstanding Labs/Studies   Follow BP and lipids  Duration of Discharge Encounter   Greater than 30 minutes including physician time.  Signed, Tami Lin Duke, PA 07/12/2022, 10:38 AM  I have seen and examined this patient with Doreene Adas.  Agree with above, note added to reflect my findings.  Patient mid to the hospital with unstable angina.  Left heart catheterization with  mild coronary artery disease.  Echo with no major abnormality.  Plan to discharge today with follow-up in clinic.  GEN: Well nourished, well developed, in no acute distress  HEENT: normal  Neck: no JVD, carotid bruits, or masses Cardiac: RRR; no murmurs, rubs, or gallops,no edema  Respiratory:  clear to auscultation bilaterally, normal work  of breathing GI: soft, nontender, nondistended, + BS MS: no deformity or atrophy  Skin: warm and dry Neuro:  Strength and sensation are intact Psych: euthymic mood, full affect     Will M. Camnitz MD 07/13/2022 7:27 AM

## 2022-07-13 ENCOUNTER — Encounter (HOSPITAL_COMMUNITY): Payer: Self-pay | Admitting: Interventional Cardiology

## 2022-07-14 LAB — LIPOPROTEIN A (LPA): Lipoprotein (a): 22.5 nmol/L (ref <75.0)
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
## 2022-07-23 ENCOUNTER — Encounter: Payer: Self-pay | Admitting: Physician Assistant

## 2022-07-23 ENCOUNTER — Ambulatory Visit: Payer: Medicare Other | Attending: Physician Assistant | Admitting: Physician Assistant

## 2022-07-23 VITALS — BP 110/64 | HR 74 | Ht 66.0 in | Wt 171.2 lb

## 2022-07-23 DIAGNOSIS — I200 Unstable angina: Secondary | ICD-10-CM

## 2022-07-23 DIAGNOSIS — I10 Essential (primary) hypertension: Secondary | ICD-10-CM | POA: Diagnosis not present

## 2022-07-23 DIAGNOSIS — E1159 Type 2 diabetes mellitus with other circulatory complications: Secondary | ICD-10-CM

## 2022-07-23 DIAGNOSIS — I2510 Atherosclerotic heart disease of native coronary artery without angina pectoris: Secondary | ICD-10-CM | POA: Diagnosis not present

## 2022-07-23 DIAGNOSIS — E782 Mixed hyperlipidemia: Secondary | ICD-10-CM

## 2022-07-23 DIAGNOSIS — R0609 Other forms of dyspnea: Secondary | ICD-10-CM

## 2022-07-23 MED ORDER — ISOSORBIDE MONONITRATE ER 30 MG PO TB24: 15.0000 mg | ORAL_TABLET | Freq: Every day | ORAL | 3 refills | Status: DC

## 2022-07-23 NOTE — Patient Instructions (Addendum)
Medication Instructions:  1.Start (Imdur) isosorbide mononitrate 15 mg daily, this will be one half of a 30 mg tablet daily. *If you need a refill on your cardiac medications before your next appointment, please call your pharmacy*   Lab Work: None If you have labs (blood work) drawn today and your tests are completely normal, you will receive your results only by: [place] (if you have MyChart) OR A paper copy in the mail If you have any lab test that is abnormal or we need to change your treatment, we will call you to review the results.   Follow-Up: At [facility], you and your health needs are our priority.  As part of our continuing mission to provide you with exceptional heart care, we have created designated Provider Care Teams.  These Care Teams include your primary Cardiologist (physician) and Advanced Practice Providers (APPs -  Physician Assistants and Nurse Practitioners) who all work together to provide you with the care you need, when you need it.  Your next appointment:   2-3 week(s)  The format for your next appointment:   In Person  Provider:   Freada Bergeron, MD  or APP  Other Instructions 1.Check your blood pressure daily, one hour after taking your morning medications, keep a log and bring it with you to your next office visit. 2.You have been referred to pulmonology.   Important Information About Sugar

## 2022-07-31 ENCOUNTER — Other Ambulatory Visit: Payer: Self-pay | Admitting: Student

## 2022-07-31 DIAGNOSIS — T7840XA Allergy, unspecified, initial encounter: Secondary | ICD-10-CM
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
## 2022-08-14 ENCOUNTER — Encounter: Payer: Self-pay | Admitting: Nurse Practitioner

## 2022-08-14 ENCOUNTER — Ambulatory Visit: Payer: Medicare Other | Attending: Nurse Practitioner | Admitting: Nurse Practitioner

## 2022-08-14 VITALS — BP 120/70 | HR 82 | Ht 66.0 in | Wt 173.0 lb

## 2022-08-14 DIAGNOSIS — I200 Unstable angina: Secondary | ICD-10-CM | POA: Diagnosis not present

## 2022-08-14 DIAGNOSIS — I2510 Atherosclerotic heart disease of native coronary artery without angina pectoris: Secondary | ICD-10-CM | POA: Diagnosis not present

## 2022-08-14 DIAGNOSIS — E782 Mixed hyperlipidemia: Secondary | ICD-10-CM

## 2022-08-14 DIAGNOSIS — I10 Essential (primary) hypertension: Secondary | ICD-10-CM | POA: Diagnosis not present

## 2022-08-14 DIAGNOSIS — R0600 Dyspnea, unspecified: Secondary | ICD-10-CM

## 2022-08-14 DIAGNOSIS — R0609 Other forms of dyspnea: Secondary | ICD-10-CM

## 2022-08-14 MED ORDER — FUROSEMIDE 20 MG PO TABS: 20.0000 mg | ORAL_TABLET | ORAL | 3 refills | Status: DC | PRN

## 2022-08-14 MED ORDER — ISOSORBIDE MONONITRATE ER 30 MG PO TB24: 30.0000 mg | ORAL_TABLET | Freq: Every day | ORAL | 3 refills | Status: DC

## 2022-08-14 NOTE — Patient Instructions (Signed)
Medication Instructions:  Your physician has recommended you make the following change in your medication:   Lasix '20mg'$  as needed for For shortness of breath or increased weight gain of 2lbs in 24 hours or 5lbs in one week 2.   Increase Imdur to '30mg'$  daily, may take a half tablet ('15mg'$ ) extra for      chest pain   *If you need a refill on your cardiac medications before your next appointment, please call your pharmacy*   Follow-Up: At [facility], you and your health needs are our priority.  As part of our continuing mission to provide you with exceptional heart care, we have created designated Provider Care Teams.  These Care Teams include your primary Cardiologist (physician) and Advanced Practice Providers (APPs -  Physician Assistants and Nurse Practitioners) who all work together to provide you with the care you need, when you need it.  Your next appointment:   3 month(s)  The format for your next appointment:   In Person  Provider:   Freada Bergeron, MD

## 2022-08-30 ENCOUNTER — Ambulatory Visit (INDEPENDENT_AMBULATORY_CARE_PROVIDER_SITE_OTHER): Payer: 59 | Admitting: Pulmonary Disease

## 2022-08-30 ENCOUNTER — Encounter: Payer: Self-pay | Admitting: Pulmonary Disease

## 2022-08-30 VITALS — BP 142/78 | HR 73 | Ht 66.0 in | Wt 172.0 lb

## 2022-08-30 DIAGNOSIS — R0602 Shortness of breath: Secondary | ICD-10-CM | POA: Diagnosis not present

## 2022-08-30 DIAGNOSIS — J438 Other emphysema: Secondary | ICD-10-CM | POA: Diagnosis not present

## 2022-08-30 MED ORDER — STIOLTO RESPIMAT 2.5-2.5 MCG/ACT IN AERS: 2.0000 | INHALATION_SPRAY | Freq: Every day | RESPIRATORY_TRACT | 3 refills | Status: DC

## 2022-08-30 MED ORDER — ALBUTEROL SULFATE HFA 108 (90 BASE) MCG/ACT IN AERS: 2.0000 | INHALATION_SPRAY | Freq: Four times a day (QID) | RESPIRATORY_TRACT | 6 refills | Status: DC | PRN
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
## 2022-08-30 NOTE — Patient Instructions (Addendum)
I think your shortness of breath is related to obstructive lung disease/emphysema that I showed you on your CAT scan  We will repeat the CAT scan of the chest  We will get a breathing study on you the next time you come in  I am going to prescribe inhalers for you to use that should help the shortness of breath  Regular exercises is important for you to be able to get more done-need to get the muscles stronger  I will see you back in about 6 to 8 weeks

## 2022-09-10 ENCOUNTER — Ambulatory Visit (HOSPITAL_COMMUNITY)
Admission: RE | Admit: 2022-09-10 | Discharge: 2022-09-10 | Disposition: A | Discharge status: Home / Self Care | Payer: 59 | Source: Ambulatory Visit | Attending: Pulmonary Disease | Admitting: Pulmonary Disease

## 2022-09-10 DIAGNOSIS — R0602 Shortness of breath: Secondary | ICD-10-CM | POA: Diagnosis present

## 2022-09-10 DIAGNOSIS — J438 Other emphysema: Secondary | ICD-10-CM | POA: Insufficient documentation

## 2022-10-03 ENCOUNTER — Ambulatory Visit (INDEPENDENT_AMBULATORY_CARE_PROVIDER_SITE_OTHER): Payer: 59 | Admitting: Pulmonary Disease

## 2022-10-03 ENCOUNTER — Encounter: Payer: Self-pay | Admitting: Pulmonary Disease

## 2022-10-03 VITALS — BP 122/68 | HR 78 | Ht 66.0 in | Wt 171.4 lb

## 2022-10-03 DIAGNOSIS — R0602 Shortness of breath: Secondary | ICD-10-CM | POA: Diagnosis not present

## 2022-10-03 DIAGNOSIS — J438 Other emphysema: Secondary | ICD-10-CM | POA: Diagnosis not present

## 2022-10-03 LAB — PULMONARY FUNCTION TEST
DL/VA % pred: 119 %
DL/VA: 4.77 ml/min/mmHg/L
DLCO cor % pred: 80 %
DLCO cor: 17.49 ml/min/mmHg
DLCO unc % pred: 74 %
DLCO unc: 16.18 ml/min/mmHg
FEF 25-75 Pre: 1.4 L/sec
FEF2575-%Pred-Pre: 81 %
FEV1-%Pred-Pre: 65 %
FEV1-Pre: 1.61 L
FEV1FVC-%Pred-Pre: 101 %
FEV6-%Pred-Pre: 68 %
FEV6-Pre: 2.21 L
FEV6FVC-%Pred-Pre: 107 %
FVC-%Pred-Pre: 63 %
FVC-Pre: 2.21 L
Pre FEV1/FVC ratio: 73 %
Pre FEV6/FVC Ratio: 100 %

## 2022-10-03 MED ORDER — STIOLTO RESPIMAT 2.5-2.5 MCG/ACT IN AERS: 2.0000 | INHALATION_SPRAY | Freq: Every day | RESPIRATORY_TRACT | 5 refills | Status: DC

## 2022-10-03 MED ORDER — BENZONATATE 100 MG PO CAPS: 200.0000 mg | ORAL_CAPSULE | Freq: Three times a day (TID) | ORAL | 0 refills | Status: DC | PRN

## 2022-10-03 NOTE — Progress Notes (Signed)
Attempted Full PFT. Spirometry and DLCO Performed Today.
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
## 2022-10-03 NOTE — Patient Instructions (Signed)
Use albuterol only as needed  Stiolto to be used 2 puffs daily on a regular basis  Graded exercises as tolerated  Sample of Trelegy to be tried over the next 1 to 2 weeks -Call us if he feels this works better than the [employer]  Cough medicine to help with coughing at night  Regular exercises about the only way to help you feel overall better  Your recent CT scan of the chest is unchanged from previous-does show emphysema and evidence of bronchitis  Your breathing study was not very optimal

## 2022-10-03 NOTE — Patient Instructions (Signed)
Attempted Full PFT. Spirometry and DLCO Performed Today.

## 2022-10-27 ENCOUNTER — Other Ambulatory Visit: Payer: Self-pay | Admitting: Internal Medicine

## 2022-10-27 DIAGNOSIS — M48062 Spinal stenosis, lumbar region with neurogenic claudication: Secondary | ICD-10-CM

## 2022-10-29 ENCOUNTER — Telehealth: Payer: Self-pay | Admitting: Pulmonary Disease

## 2022-10-29 MED ORDER — TRELEGY ELLIPTA 200-62.5-25 MCG/ACT IN AEPB: 1.0000 | INHALATION_SPRAY | Freq: Every day | RESPIRATORY_TRACT | 0 refills | Status: DC

## 2022-10-29 NOTE — Telephone Encounter (Signed)
Called and spoke with patients son. He stated that the trelegy works better for the patient and he wants a prescription sent to the pharmacy. Patients son verified pharmacy. Rx has ben sent to the patients pharmacy.   Nothing further needed.

## 2022-10-29 NOTE — Telephone Encounter (Signed)
[name]. States patient needs new RX for Trelegy. Pharmacy is [facility]. [name]. Phone number is [phone].
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
## 2022-11-12 ENCOUNTER — Other Ambulatory Visit: Payer: Self-pay | Admitting: Pulmonary Disease

## 2022-11-12 ENCOUNTER — Ambulatory Visit: Payer: 59 | Admitting: Cardiology

## 2022-12-11 ENCOUNTER — Other Ambulatory Visit: Payer: Self-pay | Admitting: Pulmonary Disease

## 2022-12-14 ENCOUNTER — Telehealth: Payer: Self-pay | Admitting: Pulmonary Disease

## 2022-12-14 ENCOUNTER — Other Ambulatory Visit: Payer: Self-pay

## 2022-12-14 MED ORDER — TRELEGY ELLIPTA 200-62.5-25 MCG/ACT IN AEPB: 1.0000 | INHALATION_SPRAY | Freq: Every day | RESPIRATORY_TRACT | 3 refills | Status: DC

## 2022-12-14 NOTE — Telephone Encounter (Signed)
ATC X1 LVM for patients son. Please advise Trelegy script has been sent to pharmacy

## 2022-12-14 NOTE — Telephone Encounter (Signed)
Pt granddaughter calling for a refill of Trelegy. He is out.  Pharm: [facility] on [address]  Christella Scheuermann [phone] (She is at work and can not take call.)  Call PT's son , also [place] @ [phone]

## 2022-12-20 NOTE — Telephone Encounter (Signed)
Trelegy refill was sent on 12/14/2022 per chart.  Will deny this refill -- duplicate rx.

## 2022-12-24 ENCOUNTER — Ambulatory Visit: Payer: 59 | Attending: Cardiology | Admitting: Physician Assistant
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
## 2022-12-25 ENCOUNTER — Encounter: Payer: Self-pay | Admitting: Physician Assistant

## 2022-12-26 ENCOUNTER — Other Ambulatory Visit: Payer: Self-pay | Admitting: Student

## 2022-12-26 DIAGNOSIS — I2510 Atherosclerotic heart disease of native coronary artery without angina pectoris: Secondary | ICD-10-CM

## 2022-12-26 MED ORDER — CARVEDILOL 12.5 MG PO TABS: 12.5000 mg | ORAL_TABLET | Freq: Two times a day (BID) | ORAL | 3 refills | Status: DC

## 2022-12-28 ENCOUNTER — Other Ambulatory Visit: Payer: Self-pay | Admitting: Student

## 2022-12-28 DIAGNOSIS — I2510 Atherosclerotic heart disease of native coronary artery without angina pectoris: Secondary | ICD-10-CM

## 2023-01-14 ENCOUNTER — Encounter: Payer: Self-pay | Admitting: *Deleted

## 2023-01-16 ENCOUNTER — Encounter: Payer: Self-pay | Admitting: Physician Assistant

## 2023-01-16 ENCOUNTER — Ambulatory Visit: Payer: 59 | Attending: Cardiology | Admitting: Physician Assistant

## 2023-01-16 VITALS — BP 110/64 | HR 74 | Ht 66.0 in | Wt 168.2 lb

## 2023-01-16 DIAGNOSIS — E785 Hyperlipidemia, unspecified: Secondary | ICD-10-CM

## 2023-01-16 DIAGNOSIS — R0609 Other forms of dyspnea: Secondary | ICD-10-CM

## 2023-01-16 DIAGNOSIS — E1159 Type 2 diabetes mellitus with other circulatory complications: Secondary | ICD-10-CM

## 2023-01-16 DIAGNOSIS — Z7984 Long term (current) use of oral hypoglycemic drugs: Secondary | ICD-10-CM

## 2023-01-16 DIAGNOSIS — I10 Essential (primary) hypertension: Secondary | ICD-10-CM | POA: Diagnosis not present

## 2023-01-16 DIAGNOSIS — I25110 Atherosclerotic heart disease of native coronary artery with unstable angina pectoris: Secondary | ICD-10-CM | POA: Diagnosis not present

## 2023-01-16 DIAGNOSIS — I2510 Atherosclerotic heart disease of native coronary artery without angina pectoris: Secondary | ICD-10-CM

## 2023-01-16 DIAGNOSIS — I209 Angina pectoris, unspecified: Secondary | ICD-10-CM

## 2023-01-16 MED ORDER — ISOSORBIDE MONONITRATE ER 30 MG PO TB24: 30.0000 mg | ORAL_TABLET | Freq: Every day | ORAL | 3 refills | Status: DC

## 2023-01-16 MED ORDER — CARVEDILOL 12.5 MG PO TABS: 12.5000 mg | ORAL_TABLET | Freq: Two times a day (BID) | ORAL | 3 refills | Status: DC
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
## 2023-01-16 NOTE — Patient Instructions (Addendum)
Medication Instructions:   Your physician recommends that you continue on your current medications as directed. Please refer to the Current Medication list given to you today.  *If you need a refill on your cardiac medications before your next appointment, please call your pharmacy*   Lab Work:   RETURN WHEN FASTING  FOR LIPIDS AND LFT    If you have labs (blood work) drawn today and your tests are completely normal, you will receive your results only by: MyChart Message (if you have MyChart) OR A paper copy in the mail If you have any lab test that is abnormal or we need to change your treatment, we will call you to review the results.   Testing/Procedures: NONE ORDERED  TODAY    Follow-Up: At [facility], you and your health needs are our priority.  As part of our continuing mission to provide you with exceptional heart care, we have created designated Provider Care Teams.  These Care Teams include your primary Cardiologist (physician) and Advanced Practice Providers (APPs -  Physician Assistants and Nurse Practitioners) who all work together to provide you with the care you need, when you need it.  We recommend signing up for the patient portal called "MyChart".  Sign up information is provided on this After Visit Summary.  MyChart is used to connect with patients for Virtual Visits (Telemedicine).  Patients are able to view lab/test results, encounter notes, upcoming appointments, etc.  Non-urgent messages can be sent to your provider as well.   To learn more about what you can do with MyChart, go to [email or web address].    Your next appointment:   6 month(s)  Provider:    DR Lynnette Caffey  6  NEW PATIENT [place] PATIENT  Other Instructions   Heart-Healthy Eating Plan Eating a healthy diet is important for the health of your heart. A heart-healthy eating plan includes: Eating less unhealthy fats. Eating more healthy fats. Eating less salt in your food. Salt is  also called sodium. Making other changes in your diet. Talk with your doctor or a diet specialist (dietitian) to create an eating plan that is right for you. What is my plan? Your doctor may recommend an eating plan that includes: Total fat: ______% or less of total calories a day. Saturated fat: ______% or less of total calories a day. Cholesterol: less than _________mg a day. Sodium: less than _________mg a day. What are tips for following this plan? Cooking Avoid frying your food. Try to bake, boil, grill, or broil it instead. You can also reduce fat by: Removing the skin from poultry. Removing all visible fats from meats. Steaming vegetables in water or broth. Meal planning  At meals, divide your plate into four equal parts: Fill one-half of your plate with vegetables and green salads. Fill one-fourth of your plate with whole grains. Fill one-fourth of your plate with lean protein foods. Eat 2-4 cups of vegetables per day. One cup of vegetables is: 1 cup (91 g) broccoli or cauliflower florets. 2 medium carrots. 1 large bell pepper. 1 large sweet potato. 1 large tomato. 1 medium white potato. 2 cups (150 g) raw leafy greens. Eat 1-2 cups of fruit per day. One cup of fruit is: 1 small apple 1 large banana 1 cup (237 g) mixed fruit, 1 large orange,  cup (82 g) dried fruit, 1 cup (240 mL) 100% fruit juice. Eat more foods that have soluble fiber. These are apples, broccoli, carrots, beans, peas, and barley. Try to get  20-30 g of fiber per day. Eat 4-5 servings of nuts, legumes, and seeds per week: 1 serving of dried beans or legumes equals  cup (90 g) cooked. 1 serving of nuts is  oz (12 almonds, 24 pistachios, or 7 walnut halves). 1 serving of seeds equals  oz (8 g). General information Eat more home-cooked food. Eat less restaurant, buffet, and fast food. Limit or avoid alcohol. Limit foods that are high in starch and sugar. Avoid fried foods. Lose weight if you  are overweight. Keep track of how much salt (sodium) you eat. This is important if you have high blood pressure. Ask your doctor to tell you more about this. Try to add vegetarian meals each week. Fats Choose healthy fats. These include olive oil and canola oil, flaxseeds, walnuts, almonds, and seeds. Eat more omega-3 fats. These include salmon, mackerel, sardines, tuna, flaxseed oil, and ground flaxseeds. Try to eat fish at least 2 times each week. Check food labels. Avoid foods with trans fats or high amounts of saturated fat. Limit saturated fats. These are often found in animal products, such as meats, butter, and cream. These are also found in plant foods, such as palm oil, palm kernel oil, and coconut oil. Avoid foods with partially hydrogenated oils in them. These have trans fats. Examples are stick margarine, some tub margarines, cookies, crackers, and other baked goods. What foods should I eat? Fruits All fresh, canned (in natural juice), or frozen fruits. Vegetables Fresh or frozen vegetables (raw, steamed, roasted, or grilled). Green salads. Grains Most grains. Choose whole wheat and whole grains most of the time. Rice and pasta, including brown rice and pastas made with whole wheat. Meats and other proteins Lean, well-trimmed beef, veal, pork, and lamb. Chicken and [place] without skin. All fish and shellfish. Wild duck, rabbit, pheasant, and venison. Egg whites or low-cholesterol egg substitutes. Dried beans, peas, lentils, and tofu. Seeds and most nuts. Dairy Low-fat or nonfat cheeses, including ricotta and mozzarella. Skim or 1% milk that is liquid, powdered, or evaporated. Buttermilk that is made with low-fat milk. Nonfat or low-fat yogurt. Fats and oils Non-hydrogenated (trans-free) margarines. Vegetable oils, including soybean, sesame, sunflower, olive, peanut, safflower, corn, canola, and cottonseed. Salad dressings or mayonnaise made with a vegetable oil. Beverages Mineral  water. Coffee and tea. Diet carbonated beverages. Sweets and desserts Sherbet, gelatin, and fruit ice. Small amounts of dark chocolate. Limit all sweets and desserts. Seasonings and condiments All seasonings and condiments. The items listed above may not be a complete list of foods and drinks you can eat. Contact a dietitian for more options. What foods should I avoid? Fruits Canned fruit in heavy syrup. Fruit in cream or butter sauce. Fried fruit. Limit coconut. Vegetables Vegetables cooked in cheese, cream, or butter sauce. Fried vegetables. Grains Breads that are made with saturated or trans fats, oils, or whole milk. Croissants. Sweet rolls. Donuts. High-fat crackers, such as cheese crackers. Meats and other proteins Fatty meats, such as hot dogs, ribs, sausage, bacon, rib-eye roast or steak. High-fat deli meats, such as salami and bologna. Caviar. Domestic duck and goose. Organ meats, such as liver. Dairy Cream, sour cream, cream cheese, and creamed cottage cheese. Whole-milk cheeses. Whole or 2% milk that is liquid, evaporated, or condensed. Whole buttermilk. Cream sauce or high-fat cheese sauce. Yogurt that is made from whole milk. Fats and oils Meat fat, or shortening. Cocoa butter, hydrogenated oils, palm oil, coconut oil, palm kernel oil. Solid fats and shortenings, including bacon fat, salt  pork, lard, and butter. Nondairy cream substitutes. Salad dressings with cheese or sour cream. Beverages Regular sodas and juice drinks with added sugar. Sweets and desserts Frosting. Pudding. Cookies. Cakes. Pies. Milk chocolate or white chocolate. Buttered syrups. Full-fat ice cream or ice cream drinks. The items listed above may not be a complete list of foods and drinks to avoid. Contact a dietitian for more information. Summary Heart-healthy meal planning includes eating less unhealthy fats, eating more healthy fats, and making other changes in your diet. Eat a balanced diet. This  includes fruits and vegetables, low-fat or nonfat dairy, lean protein, nuts and legumes, whole grains, and heart-healthy oils and fats. This information is not intended to replace advice given to you by your health care provider. Make sure you discuss any questions you have with your health care provider. Document Revised: 09/11/2021 Document Reviewed: 09/11/2021 Elsevier Patient Education  2024 Elsevier Inc. Low-Sodium Eating Plan Salt (sodium) helps you keep a healthy balance of fluids in your body. Too much sodium can raise your blood pressure. It can also cause fluid and waste to be held in your body. Your health care provider or dietitian may recommend a low-sodium eating plan if you have high blood pressure (hypertension), kidney disease, liver disease, or heart failure. Eating less sodium can help lower your blood pressure and reduce swelling. It can also protect your heart, liver, and kidneys. What are tips for following this plan? Reading food labels  Check food labels for the amount of sodium per serving. If you eat more than one serving, you must multiply the listed amount by the number of servings. Choose foods with less than 140 milligrams (mg) of sodium per serving. Avoid foods with 300 mg of sodium or more per serving. Always check how much sodium is in a product, even if the label says "unsalted" or "no salt added." Shopping  Buy products labeled as "low-sodium" or "no salt added." Buy fresh foods. Avoid canned foods and pre-made or frozen meals. Avoid canned, cured, or processed meats. Buy breads that have less than 80 mg of sodium per slice. Cooking  Eat more home-cooked food. Try to eat less restaurant, buffet, and fast food. Try not to add salt when you cook. Use salt-free seasonings or herbs instead of table salt or sea salt. Check with your provider or pharmacist before using salt substitutes. Cook with plant-based oils, such as canola, sunflower, or olive oil. Meal  planning When eating at a restaurant, ask if your food can be made with less salt or no salt. Avoid dishes labeled as brined, pickled, cured, or smoked. Avoid dishes made with soy sauce, miso, or teriyaki sauce. Avoid foods that have monosodium glutamate (MSG) in them. MSG may be added to some restaurant food, sauces, soups, bouillon, and canned foods. Make meals that can be grilled, baked, poached, roasted, or steamed. These are often made with less sodium. General information Try to limit your sodium intake to 1,500-2,300 mg each day, or the amount told by your provider. What foods should I eat? Fruits Fresh, frozen, or canned fruit. Fruit juice. Vegetables Fresh or frozen vegetables. "No salt added" canned vegetables. "No salt added" tomato sauce and paste. Low-sodium or reduced-sodium tomato and vegetable juice. Grains Low-sodium cereals, such as oats, puffed wheat and rice, and shredded wheat. Low-sodium crackers. Unsalted rice. Unsalted pasta. Low-sodium bread. Whole grain breads and whole grain pasta. Meats and other proteins Fresh or frozen meat, poultry, seafood, and fish. These should have no added salt.  Low-sodium canned tuna and salmon. Unsalted nuts. Dried peas, beans, and lentils without added salt. Unsalted canned beans. Eggs. Unsalted nut butters. Dairy Milk. Soy milk. Cheese that is naturally low in sodium, such as ricotta cheese, fresh mozzarella, or Swiss cheese. Low-sodium or reduced-sodium cheese. Cream cheese. Yogurt. Seasonings and condiments Fresh and dried herbs and spices. Salt-free seasonings. Low-sodium mustard and ketchup. Sodium-free salad dressing. Sodium-free light mayonnaise. Fresh or refrigerated horseradish. Lemon juice. Vinegar. Other foods Homemade, reduced-sodium, or low-sodium soups. Unsalted popcorn and pretzels. Low-salt or salt-free chips. The items listed above may not be all the foods and drinks you can have. Talk to a dietitian to learn more. What  foods should I avoid? Vegetables Sauerkraut, pickled vegetables, and relishes. Olives. [place] fries. Onion rings. Regular canned vegetables, except low-sodium or reduced-sodium items. Regular canned tomato sauce and paste. Regular tomato and vegetable juice. Frozen vegetables in sauces. Grains Instant hot cereals. Bread stuffing, pancake, and biscuit mixes. Croutons. Seasoned rice or pasta mixes. Noodle soup cups. Boxed or frozen macaroni and cheese. Regular salted crackers. Self-rising flour. Meats and other proteins Meat or fish that is salted, canned, smoked, spiced, or pickled. Precooked or cured meat, such as sausages or meat loaves. Tomasa Blase. Ham. Pepperoni. Hot dogs. Corned beef. Chipped beef. Salt pork. Jerky. Pickled herring, anchovies, and sardines. Regular canned tuna. Salted nuts. Dairy Processed cheese and cheese spreads. Hard cheeses. Cheese curds. Blue cheese. Feta cheese. String cheese. Regular cottage cheese. Buttermilk. Canned milk. Fats and oils Salted butter. Regular margarine. Ghee. Bacon fat. Seasonings and condiments Onion salt, garlic salt, seasoned salt, table salt, and sea salt. Canned and packaged gravies. Worcestershire sauce. Tartar sauce. Barbecue sauce. Teriyaki sauce. Soy sauce, including reduced-sodium soy sauce. Steak sauce. Fish sauce. Oyster sauce. Cocktail sauce. Horseradish that you find on the shelf. Regular ketchup and mustard. Meat flavorings and tenderizers. Bouillon cubes. Hot sauce. Pre-made or packaged marinades. Pre-made or packaged taco seasonings. Relishes. Regular salad dressings. Salsa. Other foods Salted popcorn and pretzels. Corn chips and puffs. Potato and tortilla chips. Canned or dried soups. Pizza. Frozen entrees and pot pies. The items listed above may not be all the foods and drinks you should avoid. Talk to a dietitian to learn more. This information is not intended to replace advice given to you by your health care provider. Make sure you  discuss any questions you have with your health care provider. Document Revised: 08/23/2022 Document Reviewed: 08/23/2022 Elsevier Patient Education  2024 [employer].

## 2023-01-23 ENCOUNTER — Ambulatory Visit: Payer: 59 | Attending: Physician Assistant

## 2023-01-23 DIAGNOSIS — E785 Hyperlipidemia, unspecified: Secondary | ICD-10-CM

## 2023-01-23 DIAGNOSIS — I10 Essential (primary) hypertension: Secondary | ICD-10-CM

## 2023-01-23 LAB — HEPATIC FUNCTION PANEL
ALT: 14 IU/L (ref 0–44)
AST: 19 IU/L (ref 0–40)
Albumin: 4 g/dL (ref 3.8–4.8)
Alkaline Phosphatase: 63 IU/L (ref 44–121)
Bilirubin Total: 0.4 mg/dL (ref 0.0–1.2)
Bilirubin, Direct: 0.15 mg/dL (ref 0.00–0.40)
Total Protein: 6.6 g/dL (ref 6.0–8.5)

## 2023-01-23 LAB — LIPID PANEL
Chol/HDL Ratio: 2.8 ratio (ref 0.0–5.0)
Cholesterol, Total: 96 mg/dL — ABNORMAL LOW (ref 100–199)
HDL: 34 mg/dL — ABNORMAL LOW (ref >39)
LDL Chol Calc (NIH): 48 mg/dL (ref 0–99)
Triglycerides: 65 mg/dL (ref 0–149)
VLDL Cholesterol Cal: 14 mg/dL (ref 5–40)

## 2023-02-08 ENCOUNTER — Other Ambulatory Visit: Payer: Self-pay | Admitting: Student

## 2023-02-08 DIAGNOSIS — M48062 Spinal stenosis, lumbar region with neurogenic claudication: Secondary | ICD-10-CM

## 2023-03-22 ENCOUNTER — Ambulatory Visit: Payer: 59 | Admitting: Primary Care

## 2023-03-22 ENCOUNTER — Encounter: Payer: Self-pay | Admitting: Primary Care

## 2023-03-22 VITALS — BP 130/80 | HR 90 | Temp 98.4°F | Ht 66.0 in | Wt 171.4 lb

## 2023-03-22 DIAGNOSIS — J438 Other emphysema: Secondary | ICD-10-CM | POA: Diagnosis not present

## 2023-03-22 DIAGNOSIS — K219 Gastro-esophageal reflux disease without esophagitis: Secondary | ICD-10-CM | POA: Diagnosis not present

## 2023-03-22 DIAGNOSIS — I209 Angina pectoris, unspecified: Secondary | ICD-10-CM | POA: Diagnosis not present

## 2023-03-22 MED ORDER — TRELEGY ELLIPTA 200-62.5-25 MCG/ACT IN AEPB: 1.0000 | INHALATION_SPRAY | Freq: Every day | RESPIRATORY_TRACT | 5 refills | Status: AC

## 2023-03-22 NOTE — Assessment & Plan Note (Signed)
-   He experiences intermittent chest pain at night. No active CP today in office. Following with cardiology, he has nitroglycerin prn. Overdue for 3 month follow-up with them, advised patient to schedule.

## 2023-03-22 NOTE — Patient Instructions (Addendum)
CT lungs in January 2024 was stable since 2018   Recommendations: - Continue TRELEGY 1 PUFF DAILY  - Use Albuterol (proair) 2 puffs every 4-6 hours as needed only for shortness of breath   - Contact cardiology for appointment- you saw NP/PA in December and they recommended follow-up in 3 months with Dr. Shari Prows which would have been March/April  Orders: ONO on RA to assess for oxygen need at night time   Follow-up: 6 months with Dr. Wynona [name]     _______________________________________________________________  [name] en enero de 2024 se mantuvo estable desde 2018.  Recomendaciones: - Continuar con TRELEGY 1 PUFF DIARIO - Usar albuterol (proair) 2 puffs cada 4-6 horas segn sea necesario solo para la falta de [place].  - Contactar a cardiologa para una cita: vio a un enfermero practicante/asistente mdico en diciembre y le recomendaron un seguimiento en 3 meses con el Dr. Shari Prows, que habra sido en marzo/abril.  rdenes: ONO en [place] para evaluar la necesidad de oxgeno durante la noche.  Seguimiento: 6 meses con el Dr. Olalere___________________
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
## 2023-03-22 NOTE — Assessment & Plan Note (Addendum)
-   Improved; Prefers Trelegy vs Stiolto - No active respiratory complaints. No cough or sputum production - CT chest in January 2024 showed stable findings compared to 2018 regarding his lungs/ mild bronchiectasis without honeycombing or opacities; few spaces of emphysema at apices  - He experiences dyspnea at night, checking overnight oximetry on room air

## 2023-03-22 NOTE — Assessment & Plan Note (Signed)
-   Continue PPI as directed

## 2023-05-10 ENCOUNTER — Other Ambulatory Visit: Payer: Self-pay | Admitting: Pulmonary Disease

## 2023-05-22 ENCOUNTER — Telehealth: Payer: Self-pay | Admitting: Pulmonary Disease

## 2023-06-07 ENCOUNTER — Other Ambulatory Visit: Payer: Self-pay | Admitting: Internal Medicine

## 2023-06-19 ENCOUNTER — Other Ambulatory Visit: Payer: Self-pay

## 2023-06-19 ENCOUNTER — Encounter: Payer: Self-pay | Admitting: Internal Medicine

## 2023-06-19 ENCOUNTER — Ambulatory Visit: Payer: 59 | Admitting: Internal Medicine

## 2023-06-19 VITALS — BP 103/63 | HR 92 | Temp 99.1°F | Resp 32 | Ht 66.0 in | Wt 171.0 lb

## 2023-06-19 DIAGNOSIS — E119 Type 2 diabetes mellitus without complications: Secondary | ICD-10-CM | POA: Diagnosis not present

## 2023-06-19 DIAGNOSIS — E785 Hyperlipidemia, unspecified: Secondary | ICD-10-CM

## 2023-06-19 DIAGNOSIS — R42 Dizziness and giddiness: Secondary | ICD-10-CM | POA: Diagnosis not present

## 2023-06-19 DIAGNOSIS — K219 Gastro-esophageal reflux disease without esophagitis: Secondary | ICD-10-CM

## 2023-06-19 DIAGNOSIS — K529 Noninfective gastroenteritis and colitis, unspecified: Secondary | ICD-10-CM

## 2023-06-19 DIAGNOSIS — Z7984 Long term (current) use of oral hypoglycemic drugs: Secondary | ICD-10-CM | POA: Diagnosis not present

## 2023-06-19 DIAGNOSIS — N401 Enlarged prostate with lower urinary tract symptoms: Secondary | ICD-10-CM

## 2023-06-19 DIAGNOSIS — Z7982 Long term (current) use of aspirin: Secondary | ICD-10-CM

## 2023-06-19 DIAGNOSIS — M48062 Spinal stenosis, lumbar region with neurogenic claudication: Secondary | ICD-10-CM

## 2023-06-19 LAB — POCT GLYCOSYLATED HEMOGLOBIN (HGB A1C): Hemoglobin A1C: 7.7 % — AB (ref 4.0–5.6)

## 2023-06-19 LAB — GLUCOSE, CAPILLARY: Glucose-Capillary: 115 mg/dL — ABNORMAL HIGH (ref 70–99)

## 2023-06-19 MED ORDER — PREGABALIN 25 MG PO CAPS: ORAL_CAPSULE | ORAL | 0 refills | Status: DC
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
## 2023-06-19 NOTE — Patient Instructions (Addendum)
Thank you, [name] for allowing [place] to provide your care today.   Your blood pressure dropped when you stop up which can make you very dizzy! Please stop amlodipine and lasix. Follow-up within 2-3 weeks to recheck your blood pressure Diabetes- I am checking labs for your cholesterol and diabetes today  I have ordered the following labs for you:  Lab Orders         Microalbumin / creatinine urine ratio         Lipid Profile         POC Hbg A1C       Referrals ordered today:   Referral Orders         Ambulatory referral to Ophthalmology      I have ordered the following medication/changed the following medications:   Stop the following medications: Medications Discontinued During This Encounter  Medication Reason   DULoxetine (CYMBALTA) 30 MG capsule    amLODipine (NORVASC) 2.5 MG tablet    furosemide (LASIX) 20 MG tablet    pregabalin (LYRICA) 25 MG capsule Reorder     Start the following medications: Meds ordered this encounter  Medications   pregabalin (LYRICA) 25 MG capsule    Sig: TAKE 1 CAPSULE BY MOUTH THREE TIMES A DAY    Dispense:  270 capsule    Refill:  0    Not to exceed 4 additional fills before 04/27/2023     Follow up:  4 weeks for dizziness    We look forward to seeing you next time. Please call our clinic at [phone] if you have any questions or concerns. The best time to call is Monday-Friday from 9am-4pm, but there is someone available 24/7. If after hours or the weekend, call the main hospital number and ask for the Internal Medicine Resident On-Call. If you need medication refills, please notify your pharmacy one week in advance and they will send [place] a request.   Thank you for trusting me with your care. Wishing you the best!   Jason Christians, DO [facility]  1. Su presin arterial baj cuando dej de fumar, lo que puede causarle muchos mareos! Por favor, suspenda amlodipino y lasix. Seguimiento dentro de 2 a  3 semanas para volver a Facilities manager presin arterial. 2. Diabetes: hoy estoy revisando los laboratorios para detectar su colesterol y diabetes.  He ordenado los siguientes laboratorios para usted:  rdenes de Chiropractor microalbmina/creatinina en [place]         Perfil lipdico         POC Hbg A1C       Referencias ordenadas hoy:   rdenes de referencia         Derivacin ambulatoria a Oftalmologa      He ordenado/cambiado los siguientes medicamentos:   Suspenda los siguientes medicamentos: Medicamentos discontinuados durante este encuentro Motivo de la medicacin  DULoxetina (CYMBALTA) cpsula de 30 MG   tableta de amLODipina (NORVASC) de 2,5 mg   tableta de furosemida (LASIX) de 20 mg   pregabalina (LYRICA) cpsula de 25 MG Reordenar    Inicie los siguientes medicamentos: Los mdicos ordenaron este encuentro Medicamentos  pregabalina (LYRICA) cpsula de 25 mg   Sig: TOMAR 1 CPSULA POR VA BOCA TRES VECES AL DA   Dispensacin: 270 cpsulas   Recarga: 0   No exceder 4 rellenos adicionales antes del 02/26/2023    Seguimiento: 4 semanas para mareos.    Esperamos verte  la prxima vez. Llame a nuestra clnica al [phone] si tiene alguna pregunta o inquietud. El mejor momento para llamar es de lunes a viernes de 9 a. m. a 4 p. m., pero hay alguien disponible las 24 horas, los 7 [address]  [address] de la [place]. Si es fuera del horario de atencin o durante el fin de [place], llame al nmero principal del hospital y pregunte por el residente de guardia de medicina interna. Si necesita reabastecimiento de medicamentos, notifique a su farmacia con una semana de anticipacin y ellos nos enviarn una solicitud.   Gracias por confiarme tu cuidado. Te deseo lo mejor!   Jason Begley, DO [facility]

## 2023-06-20 DIAGNOSIS — R42 Dizziness and giddiness: Secondary | ICD-10-CM | POA: Insufficient documentation

## 2023-06-20 LAB — MICROALBUMIN / CREATININE URINE RATIO
Creatinine, Urine: 94.9 mg/dL
Microalb/Creat Ratio: <3 mg/g{creat} (ref 0–29)
Microalbumin, Urine: <3 ug/mL

## 2023-06-20 NOTE — Assessment & Plan Note (Signed)
He reports several months of dizziness. He had been using a rollator at home as sometimes he stands and needs to suddenly sit because he feels like he will fall. Symptoms occur with standing sitting and at times laying down. He has not noticed that certain head movements trigger symptoms. When he stands, dizziness onset occurs after he has been on his feet for several seconds but no initially with standing. Orthostatics: Laying 134/65 HR 84 Sitting 116/71 HR 93 Standing 105/67 HR 102 3 minutes 119/68 HR 96 A: Differentials include orthostatic hypotension although his description sounds more like vertigo. P: Stop amlodipine and lasix, he does not understand PRN indication of this medication If remains orthostatic at follow-up with titrate BB or discontinue. He takes this for stable angina which he denies chest pain recently oday. Heart cath 2023 showed 25%-30% stenosis of LAD. If orthostatics improved would refer to vestibular rehab -DME rollator ordered

## 2023-06-20 NOTE — Progress Notes (Signed)
 Internal Medicine Clinic Attending  Case discussed with the resident physician at the time of the visit.  We reviewed the patient's history, exam, and pertinent patient test results.  I agree with the assessment, diagnosis, and plan of care documented in the resident's note.

## 2023-06-20 NOTE — Assessment & Plan Note (Addendum)
Lab Results  Component Value Date   CHOL 96 (L) 01/23/2023   HDL 34 (L) 01/23/2023   LDLCALC 48 01/23/2023   TRIG 65 01/23/2023   CHOLHDL 2.8 01/23/2023   Indication for secondary prevention. LDL is at goal <70 while not on statin. P: Aspirin started for known CAD, he did not have this in medicine bag. Last LDL at goal  Addendum 11/01: LDL 116, above goal for secondary prevention. He has not been taking lipitor 80 mg

## 2023-06-20 NOTE — Assessment & Plan Note (Signed)
Lab Results  Component Value Date   HGBA1C 7.7 (A) 06/19/2023   A1c increased from 6.3 to 7.7 from 12 months ago. Metformin is empty today and he reports that he was not able to refill medication at confirmed pharmacy even though refill are available on chart review. P: Pharmacy called regarding medications, restart metformin I am concerned about polypharmacy in this patient so do not want to start additional medications at this time for mild elevated A1c. Microalbumin today Ophthalmology referral placed

## 2023-06-21 LAB — LIPID PANEL
Chol/HDL Ratio: 4 ratio (ref 0.0–5.0)
Cholesterol, Total: 181 mg/dL (ref 100–199)
HDL: 45 mg/dL (ref >39)
LDL Chol Calc (NIH): 116 mg/dL — ABNORMAL HIGH (ref 0–99)
Triglycerides: 111 mg/dL (ref 0–149)
VLDL Cholesterol Cal: 20 mg/dL (ref 5–40)

## 2023-06-21 MED ORDER — OMEPRAZOLE 20 MG PO CPDR: 20.0000 mg | DELAYED_RELEASE_CAPSULE | Freq: Every day | ORAL | 3 refills | Status: DC

## 2023-06-21 MED ORDER — TAMSULOSIN HCL 0.4 MG PO CAPS: ORAL_CAPSULE | ORAL | 3 refills | Status: DC

## 2023-06-21 MED ORDER — LOPERAMIDE HCL 2 MG PO TABS: 2.0000 mg | ORAL_TABLET | Freq: Four times a day (QID) | ORAL | 1 refills | Status: DC | PRN

## 2023-06-21 MED ORDER — ONETOUCH VERIO W/DEVICE KIT: 1.0000 | PACK | Freq: Every day | 1 refills | Status: DC

## 2023-06-21 MED ORDER — ATORVASTATIN CALCIUM 80 MG PO TABS: 80.0000 mg | ORAL_TABLET | Freq: Every day | ORAL | 3 refills | Status: DC

## 2023-06-21 MED ORDER — METFORMIN HCL ER 500 MG PO TB24
500.0000 mg | ORAL_TABLET | Freq: Two times a day (BID) | ORAL | 2 refills | Status: DC
Start: 2023-06-21 — End: 2023-07-30

## 2023-06-21 NOTE — Addendum Note (Signed)
Addended by: Lucille Passy on: 06/21/2023 06:35 PM   Modules accepted: Orders

## 2023-06-24 NOTE — Progress Notes (Deleted)
Cardiology Office Note:   Date:  06/24/2023  ID:  [name], DOB [birth year], MRN [id number] PCP:  [name], MD  [facility] Providers Cardiologist:  Alverda Skeans, MD Referring MD: Doran Stabler, DO  Chief Complaint/Reason for Referral:  Cardiology follow up ASSESSMENT:    1. Coronary artery disease involving native coronary artery of native heart without angina pectoris   2. Type 2 diabetes mellitus without complication, without long-term current use of insulin (HCC)   3. Hypertension associated with diabetes (HCC)   4. Hyperlipidemia associated with type 2 diabetes mellitus (HCC)   5. Aortic atherosclerosis (HCC)   6. BMI 27.0-27.9,adult   7. LVH (left ventricular hypertrophy)     PLAN:   In order of problems listed above: Coronary artery disease: Relatively mild in nature.  No CMD provocative studies were performed during coronary angiography in 2023.  Continue aspirin, statin, and Imdur for now.*** Type 2 diabetes: Continue aspirin, statin, lisinopril.  Start Jardiance 10 mg daily.*** Hypertension:*** Hyperlipidemia: Start Zetia 10 mg daily and check lipid panel, LFTs, and LP(a) in 2 months. Aortic atherosclerosis: Continue aspirin, statin, and strict blood pressure control. Elevated BMI: Refer to pharmacy for recommendations regarding GLP-1 receptor agonist therapy.*** Left ventricular hypertrophy: Continue lisinopril and start Jardiance 10 mg daily.***        {Are you ordering a CV Procedure (e.g. stress test, cath, DCCV, TEE, etc)?   Press F2        :[id number]}   Dispo:  No follow-ups on file.      Medication Adjustments/Labs and Tests Ordered: Current medicines are reviewed at length with the patient today.  Concerns regarding medicines are outlined above.  The following changes have been made:  {PLAN; NO CHANGE:13088:s}   Labs/tests ordered: No orders of the defined types were placed in this encounter.   Medication Changes: No orders of  the defined types were placed in this encounter.   Current medicines are reviewed at length with the patient today.  The patient {ACTIONS; HAS/DOES NOT HAVE:19233} concerns regarding medicines.  I spent *** minutes reviewing all clinical data during and prior to this visit including all relevant imaging studies, laboratories, clinical information from other health systems, and prior notes from both Cardiology and other specialties, interviewing the patient, and conducting a complete physical examination in order to formulate a comprehensive and personalized evaluation and treatment plan.  History of Present Illness:      FOCUSED PROBLEM LIST:   Coronary artery disease 30% mid LAD lesion cath 2023 Hypertension Left ventricular hypertrophy TTE 2023 Hyperlipidemia Type 2 diabetes Not on insulin BMI 27 Aortic atherosclerosis CT 2024 Bronchiectasis/COPD  November 2024: The patient is being seen today for cardiology follow-up.  He was last seen in May.  He had been started on Trelegy and as needed albuterol by pulmonology.  This seemed to improve his symptoms.  He denied any chest pain at that visit in May.  His LDL was checked in October and was found to be 816.  His hemoglobin A1c was also 7.7.          Current Medications: No outpatient medications have been marked as taking for the 06/28/23 encounter (Appointment) with Jason Pyo, MD.     Review of Systems:   Please see the history of present illness.    All other systems reviewed and are negative.     EKGs/Labs/Other Test Reviewed:   EKG:    EKG Interpretation Date/Time:    Ventricular Rate:  PR Interval:    QRS Duration:    QT Interval:    QTC Calculation:   R Axis:      Text Interpretation:           Risk Assessment/Calculations:   {Does this patient have ATRIAL FIBRILLATION?:[phone]}      Physical Exam:   VS:  There were no vitals taken for this visit.   No BP recorded.  {Refresh Note OR  Click here to enter BP  :1}***   Wt Readings from Last 3 Encounters:  06/19/23 171 lb (77.6 kg)  03/22/23 171 lb 6.4 oz (77.7 kg)  01/16/23 168 lb 3.2 oz (76.3 kg)      GENERAL:  No apparent distress, AOx3 HEENT:  No carotid bruits, +2 carotid impulses, no scleral icterus CAR: RRR Irregular RR*** no murmurs***, gallops, rubs, or thrills RES:  Clear to auscultation bilaterally ABD:  Soft, nontender, nondistended, positive bowel sounds x 4 VASC:  +2 radial pulses, +2 carotid pulses NEURO:  CN 2-12 grossly intact; motor and sensory grossly intact PSYCH:  No active depression or anxiety EXT:  No edema, ecchymosis, or cyanosis  Signed, Jason Pyo, MD  06/24/2023 7:48 AM    [facility] [address] [place], [place], [place]  [address] Phone: [phone]; Fax: [phone]   Note:  This document was prepared using Dragon voice recognition software and may include unintentional dictation errors.

## 2023-06-25 DIAGNOSIS — R269 Unspecified abnormalities of gait and mobility: Secondary | ICD-10-CM | POA: Diagnosis not present

## 2023-06-28 ENCOUNTER — Ambulatory Visit: Payer: 59 | Admitting: Internal Medicine

## 2023-06-28 DIAGNOSIS — I700 Atherosclerosis of aorta: Secondary | ICD-10-CM

## 2023-06-28 DIAGNOSIS — I517 Cardiomegaly: Secondary | ICD-10-CM

## 2023-06-28 DIAGNOSIS — Z6827 Body mass index (BMI) 27.0-27.9, adult: Secondary | ICD-10-CM

## 2023-06-28 DIAGNOSIS — I152 Hypertension secondary to endocrine disorders: Secondary | ICD-10-CM

## 2023-06-28 DIAGNOSIS — E119 Type 2 diabetes mellitus without complications: Secondary | ICD-10-CM

## 2023-06-28 DIAGNOSIS — E785 Hyperlipidemia, unspecified: Secondary | ICD-10-CM

## 2023-06-28 DIAGNOSIS — I2510 Atherosclerotic heart disease of native coronary artery without angina pectoris: Secondary | ICD-10-CM

## 2023-07-16 ENCOUNTER — Encounter: Payer: 59 | Admitting: Student

## 2023-07-30 ENCOUNTER — Other Ambulatory Visit: Payer: Self-pay | Admitting: Internal Medicine

## 2023-07-30 DIAGNOSIS — E119 Type 2 diabetes mellitus without complications: Secondary | ICD-10-CM

## 2023-08-08 ENCOUNTER — Other Ambulatory Visit: Payer: Self-pay | Admitting: Nurse Practitioner

## 2023-09-02 ENCOUNTER — Ambulatory Visit: Payer: 59 | Admitting: Student

## 2023-09-02 ENCOUNTER — Other Ambulatory Visit: Payer: Self-pay

## 2023-09-02 VITALS — BP 163/77 | HR 73 | Temp 97.7°F | Ht 66.0 in | Wt 171.5 lb

## 2023-09-02 DIAGNOSIS — I10 Essential (primary) hypertension: Secondary | ICD-10-CM | POA: Diagnosis not present

## 2023-09-02 DIAGNOSIS — Z79899 Other long term (current) drug therapy: Secondary | ICD-10-CM

## 2023-09-02 DIAGNOSIS — E119 Type 2 diabetes mellitus without complications: Secondary | ICD-10-CM | POA: Diagnosis not present

## 2023-09-02 DIAGNOSIS — I200 Unstable angina: Secondary | ICD-10-CM

## 2023-09-02 DIAGNOSIS — R42 Dizziness and giddiness: Secondary | ICD-10-CM

## 2023-09-02 MED ORDER — ONETOUCH VERIO VI STRP: ORAL_STRIP | 3 refills | Status: DC

## 2023-09-02 MED ORDER — LISINOPRIL 20 MG PO TABS: 20.0000 mg | ORAL_TABLET | Freq: Every day | ORAL | 3 refills | Status: DC

## 2023-09-02 MED ORDER — ONETOUCH VERIO W/DEVICE KIT: 1.0000 | PACK | Freq: Every day | 1 refills | Status: DC
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
## 2023-09-02 NOTE — Assessment & Plan Note (Signed)
 Patient endorsing unstable angina, stating he gets chest pain while at rest which resolves with nitroglycerin  use.  He has been seen by cardiology last in May, and set to follow-up with them again on the 16th of this month.  I discussed with him the importance of following up regarding his unstable angina.  He is on aspirin  and high intensity statin. Plan: Follow-up with cardiology I will message the cardiology physician regarding today's visit.

## 2023-09-02 NOTE — Patient Instructions (Addendum)
 Thank you, [name] for allowing us  to provide your care today.   I have ordered the following medication/changed the following medications:   Stop the following medications: Medications Discontinued During This Encounter  Medication Reason   lisinopril  (ZESTRIL ) 10 MG tablet    glucose blood (ONETOUCH VERIO) test strip Reorder   Blood Glucose Monitoring Suppl (ONETOUCH VERIO) w/Device KIT Reorder     Start the following medications: Meds ordered this encounter  Medications   Blood Glucose Monitoring Suppl (ONETOUCH VERIO) w/Device KIT    Sig: 1 each by Does not apply route daily.    Dispense:  1 kit    Refill:  1    The patient is not insulin  requiring, ICD 10 code E11.9. The patient tests 1 times per day.   glucose blood (ONETOUCH VERIO) test strip    Sig: Use as instructed    Dispense:  100 each    Refill:  3   lisinopril  (ZESTRIL ) 20 MG tablet    Sig: Take 1 tablet (20 mg total) by mouth daily.    Dispense:  90 tablet    Refill:  3      Follow up:  4 weeks  for Blood pressure check, orthostatics. A1c    We look forward to seeing you next time. Please call our clinic at [phone] if you have any questions or concerns. The best time to call is Monday-Friday from 9am-4pm, but there is someone available 24/7. If after hours or the weekend, call the main hospital number and ask for the Internal Medicine Resident On-Call. If you need medication refills, please notify your pharmacy one week in advance and they will send us  a request.   Thank you for trusting me with your care. Wishing you the best!  Trilby Europe MD [facility]

## 2023-09-02 NOTE — Assessment & Plan Note (Signed)
 Patient seen last visit on 06/19/2023 with concerns of orthostatic hypotension/dizziness.  He has been using a rollator at home, noticed positional dizziness.  Orthostatics at that time were positive.  Vertigo was also on the differential, amlodipine  and Lasix  were stopped.  There was some concern he was not taking his medications properly, did not understand as needed indications for medications.  Orthostatic vitals were obtained today which demonstrated drop from 176/94 systolic to 152/84 when standing. He denies any episodes of orthostatic hypotension and dizziness.  He remained asymptomatic throughout orthostatic testing today.  No signs of volume overload, lungs clear to auscultation, no lower extremity edema.  Plan: Discussed discontinuing tamsulosin , patient hesitant to do this as it has helped greatly with his urinary hesitancy and frequency. Increasing lisinopril  today to better control hypertension, discussed precautions to discontinue medications and call us  if he continues to have dizziness or syncope. Value-based care referral for medication management.

## 2023-09-02 NOTE — Assessment & Plan Note (Signed)
 Last A1c 7.7 on 06/19/2023, up from 6.3.  He has been out of his metformin  at last visit, and seems like he had not refilled this medication.  Metformin  was restarted.  There was some concern for polypharmacy, and it was decided to limit the amount of new medications.  In speaking with the patient today, he states that he is checking his blood sugars twice daily at his friend's house since his blood sugar meter is broken.  His blood sugars seem to be relatively well-controlled by his blood sugar readings.  HE states that he only takes his metformin  when his blood sugars are in the high 100s. Plan: Refill blood sugar kit, test trips Discussed taking metformin  as prescribed daily, rather than as needed A1c next visit

## 2023-09-02 NOTE — Assessment & Plan Note (Signed)
 Amlodipine  and Lasix  discontinued last visit, patient hypertensive today with persistently positive orthostatics.  We will go ahead and increase his lisinopril  from 10 mg to 20 mg daily.  He is compliant with all his other medications.  Plan: Increase lisinopril  10 mg to 20 mg daily.

## 2023-09-03 ENCOUNTER — Telehealth: Payer: Self-pay

## 2023-09-03 NOTE — Progress Notes (Signed)
 Care Guide Pharmacy Note  09/03/2023 Name: [name] MRN: [id number] DOB: 08/12/1946  Referred By: Gabino Boga, MD Reason for referral: Care Coordination (Outreach to schedule with Pharm d )   [name] is a 78 y.o. year old male who is a primary care patient of Gabino Boga, MD.  [name] was referred to the pharmacist for assistance related to: DMII  An unsuccessful telephone outreach was attempted today to contact the patient who was referred to the pharmacy team for assistance with medication management. Additional attempts will be made to contact the patient.  [name] , RMA     [facility]  [facility], [facility] Guide  Direct Dial: [phone]  Website: [email or web address]

## 2023-09-04 NOTE — Progress Notes (Signed)
 Internal Medicine Clinic Attending  Case discussed with the resident at the time of the visit.  We reviewed the resident's history and exam and pertinent patient test results.  I agree with the assessment, diagnosis, and plan of care documented in the resident's note.  Anti-hypertensive medications have been adjusted over prior visits for symptomatic orthostatic hypotension. At this time, Mr. Nocera reports resolution of symptoms, now with elevated BP. We will cautiously increase one medication, not adding back the others that were held. Close f/u scheduled to monitor symptoms and BP. Cardiology f/u planned 1/16, no active chest pain at today's visit but describing symptoms c/f unstable angina.

## 2023-09-05 ENCOUNTER — Ambulatory Visit: Payer: 59 | Attending: Cardiology | Admitting: Cardiology

## 2023-09-05 ENCOUNTER — Encounter: Payer: Self-pay | Admitting: Cardiology

## 2023-09-05 VITALS — BP 122/76 | HR 76 | Ht 66.0 in | Wt 168.4 lb

## 2023-09-05 DIAGNOSIS — J438 Other emphysema: Secondary | ICD-10-CM | POA: Diagnosis not present

## 2023-09-05 DIAGNOSIS — E1159 Type 2 diabetes mellitus with other circulatory complications: Secondary | ICD-10-CM | POA: Diagnosis not present

## 2023-09-05 DIAGNOSIS — I10 Essential (primary) hypertension: Secondary | ICD-10-CM | POA: Diagnosis not present

## 2023-09-05 DIAGNOSIS — R0609 Other forms of dyspnea: Secondary | ICD-10-CM

## 2023-09-05 DIAGNOSIS — I200 Unstable angina: Secondary | ICD-10-CM | POA: Diagnosis not present

## 2023-09-05 DIAGNOSIS — E785 Hyperlipidemia, unspecified: Secondary | ICD-10-CM

## 2023-09-05 MED ORDER — AMLODIPINE BESYLATE 2.5 MG PO TABS: 2.5000 mg | ORAL_TABLET | Freq: Every day | ORAL | 3 refills | Status: DC

## 2023-09-05 MED ORDER — EZETIMIBE 10 MG PO TABS: 10.0000 mg | ORAL_TABLET | Freq: Every day | ORAL | 3 refills | Status: DC

## 2023-09-05 MED ORDER — NITROGLYCERIN 0.4 MG SL SUBL: 0.4000 mg | SUBLINGUAL_TABLET | SUBLINGUAL | 2 refills | Status: DC | PRN

## 2023-09-05 NOTE — Progress Notes (Signed)
Cardiology Office Note:   Date:  09/05/2023  ID:  [name], DOB Oct 30, 1945, MRN [id number] PCP: [name], MD  [facility] Providers Cardiologist:  Meriam Sprague, MD (Inactive)    History of Present Illness:   Discussed the use of AI scribe software for clinical note transcription with the patient, who gave verbal consent to proceed.  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 He presents for follow-up due to persistent chest discomfort and shortness of breath with physical activity. The patient's symptoms initially prompted a referral to cardiology, leading to an echocardiogram and left heart catheterization in November 2023. The echocardiogram showed preserved left ventricular ejection fraction with no regional wall motion abnormalities and mild concentric left ventricular hypertrophy. The heart catheterization revealed right dominant coronary anatomy with 25-30% narrowing in the middle portion of the left anterior descending artery. He was treated medically with Imdur 15mg . He was then seen by Robin Searing, NP 08/14/2022. He had been feeling much better but was still having occasional bouts of chest pain even after being started on Imdur 15 mg daily. He also reported shortness of breath with activity. Blood pressure was well-controlled. Compliant with medications. Euvolemic on exam. He was referred to pulmonology but had not been called. Imdur was increased to 30 mg daily with additional 15 as needed for breakthrough chest pain. At follow up with Jari Favre 01/16/23, patient reported no chest pain but did note chronic dyspnea.   Today the patient reports experiencing different types of chest pain, which often occur at rest and sometimes during minimal exertion. The pain is described as sharp and tight,  occurring sporadically throughout the week, sometimes up to three times a day. The patient notes that the pain is similar to what he experienced when he first sought medical attention back in 2023. The pain is usually relieved by nitroglycerin, which he takes sublingually. However, the frequency of nitroglycerin use is unclear due to the patient's memory issues following a head injury.  The patient also reports frequent episodes of dizziness, which he attributes to dehydration. He notes that these episodes can last for several days and then stop for a few days before recurring. The patient was previously on amlodipine 2.5mg  as well as Lasix and both were stopped by PCP in October in the setting of this dizziness. He has seemingly not noticed a significant change in symptoms despite stopping. The patient also mentions having difficulty inhaling his Trelegy.   In addition to these symptoms, the patient's cholesterol levels have significantly increased from 58 to 116 over the past year. He denies dietary changes and is confident he has been taking his statin as directed.      Studies Reviewed:    EKG:   EKG Interpretation Date/Time:  Thursday September 05 2023 10:23:08 EST Ventricular Rate:  76 PR Interval:  138 QRS Duration:  82 QT Interval:  350 QTC Calculation: 393 R Axis:   33  Text Interpretation: Normal sinus rhythm Normal ECG When compared with ECG of 10-Jul-2022 14:15, T wave inversion no longer evident in Inferior leads Confirmed by Perlie Gold [phone]) on 09/05/2023 10:37:44 AM   07/11/22 [facility]  Conclusions: Right dominant coronary anatomy. Focal 25 to 30% LAD narrowing in the mid vessel. Coronary arteries are otherwise widely patent Normal LV function and low normal LVEDP of 2 mmHg.  EF estimated  to be 55%.   RECOMMENDATIONS:   Risk factor modification Coronary microvascular dysfunction was considered but felt not to be helpful because of brisk coronary flow in all territories and  significant myocardial blush noted in both the left and right coronary systems.  07/11/22 TTE  IMPRESSIONS     1. Left ventricular ejection fraction, by estimation, is 65 to 70%. The  left ventricle has normal function. The left ventricle has no regional  wall motion abnormalities. There is mild concentric left ventricular  hypertrophy. Left ventricular diastolic  parameters are indeterminate. Elevated left atrial pressure.   2. Right ventricular systolic function is hyperdynamic. The right  ventricular size is normal.   3. The mitral valve is normal in structure. No evidence of mitral valve  regurgitation. No evidence of mitral stenosis.   4. The aortic valve is tricuspid. Aortic valve regurgitation is not  visualized. No aortic stenosis is present.   5. The inferior vena cava is normal in size with greater than 50%  respiratory variability, suggesting right atrial pressure of 3 mmHg.   FINDINGS   Left Ventricle: Left ventricular ejection fraction, by estimation, is 65  to 70%. The left ventricle has normal function. The left ventricle has no  regional wall motion abnormalities. Definity contrast agent was given IV  to delineate the left ventricular   endocardial borders. The left ventricular internal cavity size was normal  in size. There is mild concentric left ventricular hypertrophy. Left  ventricular diastolic parameters are indeterminate. Elevated left atrial  pressure.   Right Ventricle: The right ventricular size is normal. No increase in  right ventricular wall thickness. Right ventricular systolic function is  hyperdynamic.   Left Atrium: Left atrial size was normal in size.   Right Atrium: Right atrial size was normal in size.   Pericardium: Trivial pericardial effusion is present.   Mitral Valve: The mitral valve is normal in structure. No evidence of  mitral valve regurgitation. No evidence of mitral valve stenosis.   Tricuspid Valve: The tricuspid valve is  normal in structure. Tricuspid  valve regurgitation is not demonstrated. No evidence of tricuspid  stenosis.   Aortic Valve: The aortic valve is tricuspid. Aortic valve regurgitation is  not visualized. No aortic stenosis is present.   Pulmonic Valve: The pulmonic valve was not well visualized. Pulmonic valve  regurgitation is not visualized. No evidence of pulmonic stenosis.   Aorta: The aortic root is normal in size and structure.   Venous: The inferior vena cava is normal in size with greater than 50%  respiratory variability, suggesting right atrial pressure of 3 mmHg.   IAS/Shunts: No atrial level shunt detected by color flow Doppler.   Risk Assessment/Calculations:              Physical Exam:   VS:  BP 122/76 (BP Location: Left Arm, Patient Position: Sitting)   Pulse 76   Ht 5\' 6"  (1.676 m)   Wt 168 lb 6.4 oz (76.4 kg)   SpO2 97%   BMI 27.18 kg/m    Wt Readings from Last 3 Encounters:  09/05/23 168 lb 6.4 oz (76.4 kg)  09/02/23 171 lb 8 oz (77.8 kg)  06/19/23 171 lb (77.6 kg)     Physical Exam Vitals reviewed.  Constitutional:      Appearance: Normal appearance.  HENT:     Head: Normocephalic.  Eyes:     Pupils: Pupils are equal, round, and reactive to light.  Cardiovascular:     Rate  and Rhythm: Normal rate and regular rhythm.     Pulses: Normal pulses.     Heart sounds: Normal heart sounds.  Pulmonary:     Effort: Pulmonary effort is normal.     Comments: Slightly diminished breath sounds in all fields with no wheezing, rales, rhonchi Abdominal:     General: Abdomen is flat.     Palpations: Abdomen is soft.  Musculoskeletal:     Right lower leg: No edema.     Left lower leg: No edema.  Skin:    General: Skin is warm and dry.     Capillary Refill: Capillary refill takes less than 2 seconds.  Neurological:     General: No focal deficit present.     Mental Status: He is alert and oriented to person, place, and time.  Psychiatric:        Mood and  Affect: Mood normal.        Behavior: Behavior normal.        Thought Content: Thought content normal.        Judgment: Judgment normal.    ASSESSMENT AND PLAN:     Assessment and Plan    Stable angina Patient reports ongoing intermittent chest pain at rest and with exertion, states unchanged in frequency and severity over the past year. Largely stable though possibly accelerating. History limited by patient's memory deficits. Previous cardiac catheterization showed no significant blockages in large vessels. 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 Discussed noninvasive cardiac PET stress test to evaluate small vessel disease, with low risk of complications. - Order cardiac PET stress test - Resume amlodipine 2.5 mg daily - Continue Imdur 30 mg daily - Continue carvedilol 12.5mg  BID - Continue daily aspirin - Refill nitroglycerin  Hypertension Patient with episodes of dizziness over the last 6 months/concern for positive orthostatic vital signs. Normal BP today. Suspect that dizziness has been dehydration derived and was largely because of lasix rather than amlodipine (both stopped by PCP).  Advised home blood pressure monitoring and reporting if systolic pressure is consistently below 100 mmHg. Patient's daughter will assist in monitoring. - Start amlodipine 2.5 mg daily - Continue Coreg 12.5mg  BID - Monitor blood pressure at home and report if systolic pressure is below 100 mmHg  Chronic dyspnea and emphysema Suspect chronic dyspnea is largely of pulmonary etiology. Patient admits to ineffective use of Trelegy. - Follow up with pulmonology for recommendations on daily inhaler use.   Hyperlipidemia Cholesterol levels increased from 58 mg/dL in 5784 to 696 mg/dL in 2952. Unclear cause of change with patient reporting no diet change and adherence to statin. Discussed importance of dietary modifications. Provide dietary  recommendations handout. - Start Zetia 10mg  - Continue Atorvastatin 80mg  - Recheck cholesterol levels in 2 months - Provide Mediterranean recommendations handout  DM type II - Per PCP General Health Maintenance Discussed importance of hydration and dietary variety. Advised increasing water intake to at least four 17-ounce bottles per day to address potential dehydration contributing to dizziness.   Follow-up - Follow up in 6 weeks.         Informed Consent   Shared Decision Making/Informed Consent The risks [chest pain, shortness of breath, cardiac arrhythmias, dizziness, blood pressure fluctuations, myocardial infarction, stroke/transient ischemic attack, nausea, vomiting, allergic reaction, radiation exposure, metallic taste sensation and life-threatening complications (estimated to be 1 in 10,000)], benefits (risk stratification, diagnosing coronary artery disease, treatment guidance) and alternatives of a cardiac PET stress test were discussed in detail with  Mr. Hansel Feinstein and he agrees to proceed.      Signed, Perlie Gold, PA-C

## 2023-09-05 NOTE — Patient Instructions (Signed)
Medication Instructions:   START TAKING : AMLODIPINE 2.5 MG ONCE A DAY   START TAKING :  ZETIA 10 MG ONCE A DAY    *If you need a refill on your cardiac medications before your next appointment, please call your pharmacy*    Lab Work:   PLEASE GO DOWN STAIRS FIRST FLOOR  [address] :  RETURN IN 2 MONTHS FASTING LIVER ANS LIPID     If you have labs (blood work) drawn today and your tests are completely normal, you will receive your results only by: MyChart Message (if you have MyChart) OR A paper copy in the mail If you have any lab test that is abnormal or we need to change your treatment, we will call you to review the results.   Testing/Procedures:  Non-Cardiac CT scanning, (CAT scanning), is a noninvasive, special x-ray that produces cross-sectional images of the body using x-rays and a computer. CT scans help physicians diagnose and treat medical conditions. For some CT exams, a contrast material is used to enhance visibility in the area of the body being studied. CT scans provide greater clarity and reveal more details than regular x-ray exams.   Follow-Up: At [facility], you and your health needs are our priority.  As part of our continuing mission to provide you with exceptional heart care, we have created designated Provider Care Teams.  These Care Teams include your primary Cardiologist (physician) and Advanced Practice Providers (APPs -  Physician Assistants and Nurse Practitioners) who all work together to provide you with the care you need, when you need it.  We recommend signing up for the patient portal called "MyChart".  Sign up information is provided on this After Visit Summary.  MyChart is used to connect with patients for Virtual Visits (Telemedicine).  Patients are able to view lab/test results, encounter notes, upcoming appointments, etc.  Non-urgent messages can be sent to your provider as well.   To learn more about what you can do with MyChart, go to  [email or web address].    Your next appointment:    6 week(s)  Provider:    Jari Favre, PA-C, Ronie Spies, PA-C, Robin Searing, NP, Jacolyn Reedy, PA-C, Eligha Bridegroom, NP, Tereso Newcomer, PA-C, or Perlie Gold, PA-C    Other Instructions

## 2023-09-19 NOTE — Progress Notes (Signed)
Care Guide Pharmacy Note  09/19/2023 Name: [name] MRN: [id number] DOB: [birth year]  Referred By: [name], MD Reason for referral: Care Coordination (Outreach to schedule with Pharm d )   [name] is a 78 y.o. year old male who is a primary care patient of [name], MD.  [name] was referred to the pharmacist for assistance related to: DMII  A second unsuccessful telephone outreach was attempted today to contact the patient who was referred to the pharmacy team for assistance with medication management. Additional attempts will be made to contact the patient.  Penne Lash , RMA     [facility]  [facility], [facility] Guide  Direct Dial: [phone]  Website: [email or web address]

## 2023-09-26 NOTE — Progress Notes (Signed)
 Care Guide Pharmacy Note  09/26/2023 Name: Tayvon Culley MRN: [id number] DOB: 1946/02/11  Referred By: Gabino Boga, MD Reason for referral: Care Coordination (Outreach to schedule with Pharm d )   Kiegan Macaraeg is a 78 y.o. year old male who is a primary care patient of Gabino Boga, MD.  Caven Perine was referred to the pharmacist for assistance related to: DMII  Successful contact was made with the patient to discuss pharmacy services.  Patient declines engagement at this time. Contact information was provided to the patient should they wish to reach out for assistance at a later time.  Jeoffrey Buffalo , RMA     [facility]  [facility], [facility] Guide  Direct Dial: [phone]  Website: [email or web address]

## 2023-10-02 ENCOUNTER — Encounter: Payer: 59 | Admitting: Student

## 2023-10-08 ENCOUNTER — Ambulatory Visit: Payer: 59 | Admitting: Student

## 2023-10-08 VITALS — BP 125/74 | HR 85 | Temp 98.3°F | Ht 66.0 in | Wt 168.9 lb

## 2023-10-08 DIAGNOSIS — E119 Type 2 diabetes mellitus without complications: Secondary | ICD-10-CM | POA: Diagnosis not present

## 2023-10-08 DIAGNOSIS — I10 Essential (primary) hypertension: Secondary | ICD-10-CM

## 2023-10-08 DIAGNOSIS — Z7984 Long term (current) use of oral hypoglycemic drugs: Secondary | ICD-10-CM | POA: Diagnosis not present

## 2023-10-08 DIAGNOSIS — I2089 Other forms of angina pectoris: Secondary | ICD-10-CM | POA: Diagnosis not present

## 2023-10-08 LAB — GLUCOSE, CAPILLARY: Glucose-Capillary: 107 mg/dL — ABNORMAL HIGH (ref 70–99)

## 2023-10-08 LAB — HEPATIC FUNCTION PANEL
ALT: 17 [IU]/L (ref 0–44)
AST: 17 [IU]/L (ref 0–40)
Albumin: 4.1 g/dL (ref 3.8–4.8)
Alkaline Phosphatase: 73 [IU]/L (ref 44–121)
Bilirubin Total: 0.6 mg/dL (ref 0.0–1.2)
Bilirubin, Direct: 0.25 mg/dL (ref 0.00–0.40)
Total Protein: 7.1 g/dL (ref 6.0–8.5)

## 2023-10-08 LAB — LIPID PANEL
Chol/HDL Ratio: 2.1 {ratio} (ref 0.0–5.0)
Cholesterol, Total: 96 mg/dL — ABNORMAL LOW (ref 100–199)
HDL: 45 mg/dL (ref >39)
LDL Chol Calc (NIH): 40 mg/dL (ref 0–99)
Triglycerides: 36 mg/dL (ref 0–149)
VLDL Cholesterol Cal: 11 mg/dL (ref 5–40)

## 2023-10-08 LAB — POCT GLYCOSYLATED HEMOGLOBIN (HGB A1C): Hemoglobin A1C: 6.6 % — AB (ref 4.0–5.6)
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
## 2023-10-09 LAB — BASIC METABOLIC PANEL
BUN/Creatinine Ratio: 16 (ref 10–24)
BUN: 14 mg/dL (ref 8–27)
CO2: 24 mmol/L (ref 20–29)
Calcium: 9.2 mg/dL (ref 8.6–10.2)
Chloride: 98 mmol/L (ref 96–106)
Creatinine, Ser: 0.88 mg/dL (ref 0.76–1.27)
Glucose: 107 mg/dL — ABNORMAL HIGH (ref 70–99)
Potassium: 5.1 mmol/L (ref 3.5–5.2)
Sodium: 138 mmol/L (ref 134–144)
eGFR: 87 mL/min/{1.73_m2} (ref >59)

## 2023-10-09 LAB — MICROALBUMIN / CREATININE URINE RATIO
Creatinine, Urine: 198.6 mg/dL
Microalb/Creat Ratio: 3 mg/g{creat} (ref 0–29)
Microalbumin, Urine: 6.5 ug/mL

## 2023-10-09 NOTE — Assessment & Plan Note (Addendum)
BP: 118/68 (!) 148/77 128/76 125/74  Lisinopril increased from 10 to 20 mg last month.  At last visit he was endorsing occasional dizziness, orthostatics were positive.  Today he denies recent dizziness/lightheadedness/syncope.  Orthostatics negative. -Continue lisinopril 20 mg daily, Coreg 12.5 twice daily, amlodipine 2.5 daily (this was started at recent cardiology appointment for stable angina) -BMP

## 2023-10-09 NOTE — Addendum Note (Signed)
Addended by: Erlinda Hong T on: 10/09/2023 08:48 AM   Modules accepted: Level of Service

## 2023-10-09 NOTE — Assessment & Plan Note (Signed)
Seen by cardiology 1/19.  Patient had left heart catheterization in 2023 that did not show significant obstruction.  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  Patient scheduled for PET stress test in April.  Today he denies recent chest pain, palpitations, SOB. -Continue meds per above in addition to Imdur 30 mg daily, ASA, nitroglycerin as needed

## 2023-10-09 NOTE — Progress Notes (Signed)
 Internal Medicine Clinic Attending  Case discussed with the resident at the time of the visit.  We reviewed the resident's history and exam and pertinent patient test results.  I agree with the assessment, diagnosis, and plan of care documented in the resident's note.

## 2023-10-09 NOTE — Assessment & Plan Note (Addendum)
A1c today is 6.6.  Continue metformin 500 mg twice daily -Microalbumin to Creatinine Ratio -Ophthalmology referral

## 2023-10-10 ENCOUNTER — Encounter: Payer: Self-pay | Admitting: Student

## 2023-10-21 ENCOUNTER — Encounter: Payer: Self-pay | Admitting: Pulmonary Disease

## 2023-10-21 ENCOUNTER — Ambulatory Visit: Payer: 59 | Admitting: Pulmonary Disease

## 2023-10-21 VITALS — BP 114/72 | HR 79 | Ht 69.0 in | Wt 167.0 lb

## 2023-10-21 DIAGNOSIS — R0600 Dyspnea, unspecified: Secondary | ICD-10-CM | POA: Diagnosis not present

## 2023-10-21 DIAGNOSIS — J438 Other emphysema: Secondary | ICD-10-CM

## 2023-10-21 MED ORDER — STIOLTO RESPIMAT 2.5-2.5 MCG/ACT IN AERS: 2.0000 | INHALATION_SPRAY | Freq: Every day | RESPIRATORY_TRACT | 3 refills | Status: DC

## 2023-10-21 MED ORDER — ALBUTEROL SULFATE (2.5 MG/3ML) 0.083% IN NEBU: 2.5000 mg | INHALATION_SOLUTION | Freq: Four times a day (QID) | RESPIRATORY_TRACT | 12 refills | Status: AC | PRN
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
## 2023-10-21 NOTE — Patient Instructions (Signed)
 Regular exercises as tolerated  Prescription for a nebulizer with albuterol to be used up to 4 times a day as needed  Stiolto in place of Trelegy  I will see you back in about 3 months  Call us with significant concerns

## 2023-10-23 ENCOUNTER — Encounter: Payer: Self-pay | Admitting: Physician Assistant

## 2023-10-23 ENCOUNTER — Ambulatory Visit: Payer: 59 | Attending: Physician Assistant | Admitting: Physician Assistant

## 2023-10-23 VITALS — BP 102/64 | HR 85 | Ht 69.0 in | Wt 166.6 lb

## 2023-10-23 DIAGNOSIS — E119 Type 2 diabetes mellitus without complications: Secondary | ICD-10-CM | POA: Diagnosis not present

## 2023-10-23 DIAGNOSIS — I25110 Atherosclerotic heart disease of native coronary artery with unstable angina pectoris: Secondary | ICD-10-CM | POA: Diagnosis not present

## 2023-10-23 DIAGNOSIS — E785 Hyperlipidemia, unspecified: Secondary | ICD-10-CM

## 2023-10-23 DIAGNOSIS — I10 Essential (primary) hypertension: Secondary | ICD-10-CM | POA: Diagnosis not present

## 2023-10-23 DIAGNOSIS — I209 Angina pectoris, unspecified: Secondary | ICD-10-CM

## 2023-10-23 DIAGNOSIS — R0600 Dyspnea, unspecified: Secondary | ICD-10-CM

## 2023-10-23 MED ORDER — NITROGLYCERIN 0.4 MG SL SUBL: 0.4000 mg | SUBLINGUAL_TABLET | SUBLINGUAL | 2 refills | Status: AC | PRN

## 2023-10-23 MED ORDER — ASPIRIN 81 MG PO TBEC: 81.0000 mg | DELAYED_RELEASE_TABLET | Freq: Every day | ORAL | 3 refills | Status: DC

## 2023-10-23 NOTE — Patient Instructions (Signed)
 Medication Instructions:   Your physician recommends that you continue on your current medications as directed. Please refer to the Current Medication list given to you today.  *If you need a refill on your cardiac medications before your next appointment, please call your pharmacy*   Lab Work: NONE ORDERED  TODAY    If you have labs (blood work) drawn today and your tests are completely normal, you will receive your results only by: MyChart Message (if you have MyChart) OR A paper copy in the mail If you have any lab test that is abnormal or we need to change your treatment, we will call you to review the results.   Testing/Procedures: NONE ORDERED  TODAY     Follow-Up: At [facility], you and your health needs are our priority.  As part of our continuing mission to provide you with exceptional heart care, we have created designated Provider Care Teams.  These Care Teams include your primary Cardiologist (physician) and Advanced Practice Providers (APPs -  Physician Assistants and Nurse Practitioners) who all work together to provide you with the care you need, when you need it.  We recommend signing up for the patient portal called "MyChart".  Sign up information is provided on this After Visit Summary.  MyChart is used to connect with patients for Virtual Visits (Telemedicine).  Patients are able to view lab/test results, encounter notes, upcoming appointments, etc.  Non-urgent messages can be sent to your provider as well.   To learn more about what you can do with MyChart, go to [email or web address].    Your next appointment:   2 month(s)  Provider:   Meriam Sprague, MD (Inactive)  or Jari Favre, PA-C, Ronie Spies, PA-C, Robin Searing, NP, Jacolyn Reedy, PA-C, Eligha Bridegroom, NP, Tereso Newcomer, PA-C, or Perlie Gold, PA-C        Other Instructions    1st Floor: - Lobby - Registration  - Pharmacy  - Lab - Cafe  2nd Floor: - PV Lab - Diagnostic  Testing (echo, CT, nuclear med)  3rd Floor: - Vacant  4th Floor: - TCTS (cardiothoracic surgery) - AFib Clinic - Structural Heart Clinic - Vascular Surgery  - Vascular Ultrasound  5th Floor: - HeartCare Cardiology (general and EP) - Clinical Pharmacy for coumadin, hypertension, lipid, weight-loss medications, and med management appointments    Valet parking services will be available as well.
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
## 2023-10-24 DIAGNOSIS — J438 Other emphysema: Secondary | ICD-10-CM | POA: Diagnosis not present

## 2023-11-10 ENCOUNTER — Other Ambulatory Visit: Payer: Self-pay | Admitting: Primary Care

## 2023-12-09 ENCOUNTER — Telehealth (HOSPITAL_COMMUNITY): Payer: Self-pay | Admitting: *Deleted

## 2023-12-09 NOTE — Telephone Encounter (Signed)
 Attempted to call son regarding upcoming cardiac PET appointment. Left message on voicemail with name and callback number Kerri Peed RN Navigator Cardiac Imaging [facility] [phone] Office

## 2023-12-10 ENCOUNTER — Encounter (HOSPITAL_COMMUNITY)
Admission: RE | Admit: 2023-12-10 | Discharge: 2023-12-10 | Disposition: A | Discharge status: Home / Self Care | Payer: 59 | Source: Ambulatory Visit | Attending: Cardiology | Admitting: Cardiology

## 2023-12-10 DIAGNOSIS — I200 Unstable angina: Secondary | ICD-10-CM

## 2023-12-10 LAB — NM PET CT CARDIAC PERFUSION MULTI W/ABSOLUTE BLOODFLOW
MBFR: 1.96
Nuc Rest EF: 54 %
Nuc Stress EF: 59 %
Rest MBF: 1.12 ml/g/min
Rest Nuclear Isotope Dose: 19.7 mCi
ST Depression (mm): 0 mm
Stress MBF: 2.19 ml/g/min
Stress Nuclear Isotope Dose: 19.4 mCi
TID: 0.97

## 2023-12-10 MED ORDER — RUBIDIUM RB82 GENERATOR (RUBYFILL)
19.6000 | PACK | Freq: Once | INTRAVENOUS | Status: AC
Start: 2023-12-10 — End: 2023-12-10
  Administered 2023-12-10: 19.6 via INTRAVENOUS

## 2023-12-10 MED ORDER — REGADENOSON 0.4 MG/5ML IV SOLN
0.4000 mg | Freq: Once | INTRAVENOUS | Status: AC
  Administered 2023-12-10: 0.4 mg via INTRAVENOUS

## 2023-12-10 MED ORDER — RUBIDIUM RB82 GENERATOR (RUBYFILL)
19.7000 | PACK | Freq: Once | INTRAVENOUS | Status: AC
  Administered 2023-12-10: 19.7 via INTRAVENOUS
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
## 2023-12-19 DIAGNOSIS — J438 Other emphysema: Secondary | ICD-10-CM | POA: Diagnosis not present

## 2023-12-20 ENCOUNTER — Encounter: Payer: Self-pay | Admitting: Nurse Practitioner

## 2023-12-20 ENCOUNTER — Ambulatory Visit: Attending: Cardiology | Admitting: Nurse Practitioner

## 2023-12-20 ENCOUNTER — Other Ambulatory Visit (HOSPITAL_COMMUNITY): Payer: Self-pay

## 2023-12-20 VITALS — BP 102/58 | HR 80 | Ht 66.0 in | Wt 167.2 lb

## 2023-12-20 DIAGNOSIS — I10 Essential (primary) hypertension: Secondary | ICD-10-CM

## 2023-12-20 DIAGNOSIS — E119 Type 2 diabetes mellitus without complications: Secondary | ICD-10-CM

## 2023-12-20 DIAGNOSIS — E785 Hyperlipidemia, unspecified: Secondary | ICD-10-CM

## 2023-12-20 DIAGNOSIS — J438 Other emphysema: Secondary | ICD-10-CM

## 2023-12-20 DIAGNOSIS — I25110 Atherosclerotic heart disease of native coronary artery with unstable angina pectoris: Secondary | ICD-10-CM

## 2023-12-20 MED ORDER — AMLODIPINE BESYLATE 5 MG PO TABS: 5.0000 mg | ORAL_TABLET | Freq: Every day | ORAL | 1 refills | Status: DC

## 2023-12-20 MED ORDER — CARVEDILOL 6.25 MG PO TABS: 6.2500 mg | ORAL_TABLET | Freq: Two times a day (BID) | ORAL | 5 refills | Status: DC

## 2023-12-20 NOTE — Patient Instructions (Signed)
 Medication Instructions:  DECREASE Coreg  to 6.25mg  Take 1 tablet twice a day  INCREASE Norvasc  5mg  take 1 tablet once a day  *If you need a refill on your cardiac medications before your next appointment, please call your pharmacy*  Lab Work: None ordered If you have labs (blood work) drawn today and your tests are completely normal, you will receive your results only by: MyChart Message (if you have MyChart) OR A paper copy in the mail If you have any lab test that is abnormal or we need to change your treatment, we will call you to review the results.  Testing/Procedures: None ordered  Follow-Up: At [facility], you and your health needs are our priority.  As part of our continuing mission to provide you with exceptional heart care, our providers are all part of one team.  This team includes your primary Cardiologist (physician) and Advanced Practice Providers or APPs (Physician Assistants and Nurse Practitioners) who all work together to provide you with the care you need, when you need it.  Your next appointment:   6 month(s)  Provider:   Eilleen Grates, MD   We recommend signing up for the patient portal called "MyChart".  Sign up information is provided on this After Visit Summary.  MyChart is used to connect with patients for Virtual Visits (Telemedicine).  Patients are able to view lab/test results, encounter notes, upcoming appointments, etc.  Non-urgent messages can be sent to your provider as well.   To learn more about what you can do with MyChart, go to [email or web address].   Other Instructions

## 2023-12-26 ENCOUNTER — Other Ambulatory Visit: Payer: Self-pay | Admitting: Internal Medicine

## 2023-12-26 DIAGNOSIS — M48062 Spinal stenosis, lumbar region with neurogenic claudication: Secondary | ICD-10-CM

## 2024-01-22 ENCOUNTER — Ambulatory Visit: Admitting: Pulmonary Disease

## 2024-01-24 DIAGNOSIS — J438 Other emphysema: Secondary | ICD-10-CM | POA: Diagnosis not present

## 2024-02-01 ENCOUNTER — Other Ambulatory Visit: Payer: Self-pay | Admitting: Physician Assistant

## 2024-02-03 ENCOUNTER — Encounter: Payer: Self-pay | Admitting: *Deleted

## 2024-03-20 ENCOUNTER — Other Ambulatory Visit: Payer: Self-pay

## 2024-03-20 DIAGNOSIS — E119 Type 2 diabetes mellitus without complications: Secondary | ICD-10-CM

## 2024-03-20 MED ORDER — METFORMIN HCL ER 500 MG PO TB24: 500.0000 mg | ORAL_TABLET | Freq: Two times a day (BID) | ORAL | 1 refills | Status: DC

## 2024-03-20 NOTE — Telephone Encounter (Signed)
 Medication sent to pharmacy

## 2024-03-23 ENCOUNTER — Telehealth: Payer: Self-pay

## 2024-03-23 ENCOUNTER — Other Ambulatory Visit: Payer: Self-pay | Admitting: Student

## 2024-03-23 NOTE — Telephone Encounter (Signed)
 Encounter created in error

## 2024-03-23 NOTE — Telephone Encounter (Signed)
 Received a fax from the pharmacy requesting a new rx for a cgm meter. The covered alternatives are accu chek guide monitor system, accu chek guide me glucose meter,contour next one meter,contour next ez meter system,contour plus blue meter, contour next ez meter.  [id number] [address], [place] - [address]

## 2024-03-24 ENCOUNTER — Other Ambulatory Visit: Payer: Self-pay | Admitting: Student

## 2024-03-24 MED ORDER — ACCU-CHEK GUIDE ME W/DEVICE KIT: PACK | 0 refills | Status: AC

## 2024-03-24 MED ORDER — ACCU-CHEK GUIDE TEST VI STRP: ORAL_STRIP | 3 refills | Status: AC

## 2024-03-24 MED ORDER — ACCU-CHEK SOFTCLIX LANCETS MISC: 3 refills | Status: AC

## 2024-06-04 ENCOUNTER — Ambulatory Visit: Payer: Self-pay

## 2024-06-04 NOTE — Telephone Encounter (Signed)
 FYI Only or Action Required?: FYI only for provider.  Patient was last seen in primary care on 10/08/2023 by Marylu Gee, DO.  Called Nurse Triage reporting Leg Swelling.  Symptoms began several days ago.  Interventions attempted: Rest, hydration, or home remedies.  Symptoms are: stable.  Triage Disposition: See PCP When Office is Open (Within 3 Days)  Patient/caregiver understands and will follow disposition?: Yes Reason for Disposition  [1] MILD swelling of both ankles (i.e., pedal edema) AND [2] new-onset or getting worse  Answer Assessment - Initial Assessment Questions Granddaughter Aida calling in today   1. ONSET: When did the swelling start? (e.g., minutes, hours, days)     Granddaughter noticed it yesterday, she believes its been less than a week as she goes to visit weekly   2. LOCATION: What part of the leg is swollen?  Are both legs swollen or just one leg?     Bilateral feet and legs, and arms 3. SEVERITY: How bad is the swelling? (e.g., localized; mild, moderate, severe)     Patient stated she noticed mild swelling before, gave him compression socks and he cannot even get them on  4. REDNESS: Is there redness or signs of infection?     Denies  5. PAIN: Is the swelling painful to touch? If Yes, ask: How painful is it?   (Scale 1-10; mild, moderate or severe)     Denies  6. CAUSE: What do you think is causing the leg swelling?     Unsure  7. RECURRENT SYMPTOM: Have you had leg swelling before? If Yes, ask: When was the last time? What happened that time?     Granddaughter states not that she's known of, maybe some mild swelling  but not like this  8. OTHER SYMPTOMS: Do you have any other symptoms? (e.g., chest pain, difficulty breathing)       Denies  Protocols used: Leg Swelling and Edema-A-AH  Copied from CRM #[id number]. Topic: Clinical - Red Word Triage >> Jun 04, 2024  4:15 PM Graeme ORN wrote: Red Word that prompted transfer to  Nurse Triage: Swelling legs arms and feet.    ----------------------------------------------------------------------- From previous Reason for Contact - Scheduling: Patient/patient representative is calling to schedule an appointment. Refer to attachments for appointment information.

## 2024-06-08 ENCOUNTER — Ambulatory Visit: Payer: Self-pay

## 2024-06-17 ENCOUNTER — Ambulatory Visit (INDEPENDENT_AMBULATORY_CARE_PROVIDER_SITE_OTHER): Payer: Self-pay

## 2024-06-17 ENCOUNTER — Telehealth: Payer: Self-pay | Admitting: Dietician

## 2024-06-17 VITALS — BP 113/62 | HR 76 | Temp 98.2°F | Ht 66.0 in | Wt 170.0 lb

## 2024-06-17 DIAGNOSIS — K529 Noninfective gastroenteritis and colitis, unspecified: Secondary | ICD-10-CM

## 2024-06-17 DIAGNOSIS — Z833 Family history of diabetes mellitus: Secondary | ICD-10-CM

## 2024-06-17 DIAGNOSIS — M7989 Other specified soft tissue disorders: Secondary | ICD-10-CM | POA: Diagnosis not present

## 2024-06-17 DIAGNOSIS — R399 Unspecified symptoms and signs involving the genitourinary system: Secondary | ICD-10-CM | POA: Diagnosis not present

## 2024-06-17 DIAGNOSIS — Z87891 Personal history of nicotine dependence: Secondary | ICD-10-CM

## 2024-06-17 DIAGNOSIS — R197 Diarrhea, unspecified: Secondary | ICD-10-CM | POA: Insufficient documentation

## 2024-06-17 DIAGNOSIS — I10 Essential (primary) hypertension: Secondary | ICD-10-CM | POA: Diagnosis not present

## 2024-06-17 DIAGNOSIS — E785 Hyperlipidemia, unspecified: Secondary | ICD-10-CM | POA: Diagnosis not present

## 2024-06-17 DIAGNOSIS — I25110 Atherosclerotic heart disease of native coronary artery with unstable angina pectoris: Secondary | ICD-10-CM | POA: Diagnosis not present

## 2024-06-17 DIAGNOSIS — Z79899 Other long term (current) drug therapy: Secondary | ICD-10-CM | POA: Diagnosis not present

## 2024-06-17 DIAGNOSIS — Z7982 Long term (current) use of aspirin: Secondary | ICD-10-CM

## 2024-06-17 DIAGNOSIS — Z23 Encounter for immunization: Secondary | ICD-10-CM

## 2024-06-17 DIAGNOSIS — K219 Gastro-esophageal reflux disease without esophagitis: Secondary | ICD-10-CM

## 2024-06-17 DIAGNOSIS — Z8249 Family history of ischemic heart disease and other diseases of the circulatory system: Secondary | ICD-10-CM

## 2024-06-17 DIAGNOSIS — I2510 Atherosclerotic heart disease of native coronary artery without angina pectoris: Secondary | ICD-10-CM

## 2024-06-17 DIAGNOSIS — E119 Type 2 diabetes mellitus without complications: Secondary | ICD-10-CM | POA: Diagnosis not present

## 2024-06-17 DIAGNOSIS — I200 Unstable angina: Secondary | ICD-10-CM

## 2024-06-17 DIAGNOSIS — N401 Enlarged prostate with lower urinary tract symptoms: Secondary | ICD-10-CM

## 2024-06-17 LAB — POCT GLYCOSYLATED HEMOGLOBIN (HGB A1C): HbA1c, POC (controlled diabetic range): 7 % (ref 0.0–7.0)

## 2024-06-17 LAB — GLUCOSE, CAPILLARY: Glucose-Capillary: 108 mg/dL — ABNORMAL HIGH (ref 70–99)

## 2024-06-17 MED ORDER — OMEPRAZOLE 20 MG PO CPDR: 20.0000 mg | DELAYED_RELEASE_CAPSULE | Freq: Every day | ORAL | 3 refills | Status: AC

## 2024-06-17 MED ORDER — AMLODIPINE BESYLATE 2.5 MG PO TABS: 2.5000 mg | ORAL_TABLET | Freq: Every day | ORAL | 11 refills | Status: AC

## 2024-06-17 MED ORDER — ATORVASTATIN CALCIUM 80 MG PO TABS: 80.0000 mg | ORAL_TABLET | Freq: Every day | ORAL | 3 refills | Status: AC

## 2024-06-17 MED ORDER — ASPIRIN 81 MG PO TBEC: 81.0000 mg | DELAYED_RELEASE_TABLET | Freq: Every day | ORAL | 3 refills | Status: AC

## 2024-06-17 MED ORDER — TAMSULOSIN HCL 0.4 MG PO CAPS: ORAL_CAPSULE | ORAL | 3 refills | Status: AC

## 2024-06-17 MED ORDER — LISINOPRIL 20 MG PO TABS: 20.0000 mg | ORAL_TABLET | Freq: Every day | ORAL | 3 refills | Status: AC

## 2024-06-17 MED ORDER — LOPERAMIDE HCL 2 MG PO TABS
2.0000 mg | ORAL_TABLET | Freq: Four times a day (QID) | ORAL | 3 refills | Status: AC | PRN
Start: 2024-06-17 — End: ?

## 2024-06-17 NOTE — Assessment & Plan Note (Addendum)
 On lisinopril  20 mg daily, Coreg  12.5 mg daily --> 6.25 mg twice daily (changed 12/20/2023 by cardiology), amlodipine  2.5 mg --> 5 mg daily (resumed at 09/05/2023 cardiology appointment, increased to 12/20/2023 by cardiology), Imdur  30 mg daily, ASA, nitroglycerin .  Lipids from 10/08/2023 well-controlled. Cardiology appointment from 10/23/2023 showed mild lower extremity swelling noted and recommended using compression stockings. Granddaughter brought compression socks but he was unable to put them on.  Bilateral leg swelling.  Differential diagnosis includes  -Medication induced: Increased amlodipine  dosing to 5 mg daily on 12/20/2023 for increased antianginal properties due to microvascular disease.  Although peripheral edema from amlodipine  is generally seen around the 10 mg mark -chronic venous insufficiency: -congestive heart failure as last echo 07/11/2022 EF 65 to 70%, Cardiac PET stress test 12/10/2023: Microvascular disease.  - No previous evidence of renal disease, hepatic cirrhosis (normal hepatic function panel 01/23/2023) -Bilateral - yes, increased bruising. Pulses good. Feet warm. Good capillary refill. No SOB.   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  Patient has had previous episodes of dizziness and lightheadedness.  Because of this carvedilol  have been moved to lower dose and twice daily.  Mechanism and of her Ranolazine is unclear, however it has antianginal effects that are independent of reduction of heart rate or blood pressure.  Inhibits light sodium current which may reduce intracellular sodium and calcium  overload and ischemic myocardial cells.  Can lead to QT interval prolongation.  Does not appear through chart review that she has previously tried Ranexa  Verapamil  can  also cause peripheral edema, however incidence is lower.  Not previously tried verapamil .  Plan -Decrease amlodipine  back to 2.5 mg.  Will opt for dose reduction and assess if any improvement.  Will send note to cardiology with this change. -If lower extremity edema does not improve, can consider transition off of amlodipine  to things like verapamil  or ranolazine -Continue compression stockings -Continue ASA -Continue lisinopril  20 mg daily, Coreg  6.25 mg twice daily, Imdur  30 mg daily, nitroglycerin  0.4 mg as needed

## 2024-06-17 NOTE — Assessment & Plan Note (Signed)
 Chronic diarrhea, however over the past 2 months has been worse. Up to three times a day. Has been out of Loperamide  for 2 months. No abdominal pain. No nausea or vomiting. Not worse with certain foods. No blood in stool. Occasional black stool. Does not take iron pill. Taking Pepto bismol but not noticed black stool when taking this. Has had very few instances of black stool.  - will refill loperamide   - instructed he can buy over the counter

## 2024-06-17 NOTE — Assessment & Plan Note (Signed)
  Refilled atorvastatin 80 mg.  

## 2024-06-17 NOTE — Assessment & Plan Note (Signed)
Refilled omeprazole 20mg. 

## 2024-06-17 NOTE — Assessment & Plan Note (Addendum)
 Lab Results  Component Value Date   HGBA1C 7.0 06/17/2024   HGBA1C 6.6 (A) 10/08/2023   HGBA1C 7.7 (A) 06/19/2023       Latest Ref Rng & Units 10/08/2023    5:01 PM 07/11/2022    2:09 AM 07/10/2022    3:09 PM  BMP  Glucose 70 - 99 mg/dL 892  95  95   BUN 8 - 27 mg/dL 14  10  9    Creatinine 0.76 - 1.27 mg/dL 9.11  9.06  9.03   BUN/Creat Ratio 10 - 24 16     Sodium 134 - 144 mmol/L 138  136  139   Potassium 3.5 - 5.2 mmol/L 5.1  3.7  4.8   Chloride 96 - 106 mmol/L 98  102  101   CO2 20 - 29 mmol/L 24  24  27    Calcium  8.6 - 10.2 mg/dL 9.2  8.8  9.1     Medications: Metformin  500 mg twice daily  Blood Sugars at home: twice a day. Doesn't write them down. When he checked BG he only checks in AM before eats and in evening before dinner.  Diet and Exercise: no diet or exercise. Eats beans, chicken, stew, vegetables  Diabetic Counseling: referral to donna placed  Opthalmology: referral sent 10/09/2023 Neuropathy: Pregabalin  25 mg?  -A1c  -Repeat lipid panel yearly ---> Zetia  10 mg daily Lipitor  80 mg daily

## 2024-06-17 NOTE — Patient Instructions (Addendum)
 Hoy hablamos rohm and haas siguientes afecciones mdicas y el plan:  - Controle su nivel de azcar en sangre por la maana antes de comer y una o dos horas despus de cenar. Anote estos resultados y trigalos a su prxima cita. Nivel de azcar en sangre por la maana en ayunas >150 mg/dL, despus de comer >749 mg/dL. Documente todo, pero estos valores son importantes.  - Reduciremos la dosis de amlodipino a 2.5 mg diarios (en lugar de 5 mg diarios). Si el dolor de pecho empeora, aumente la dosis a 5 mg.  - Pruebe usar medias de compresin.  - Mantenga los pies en alto cuando no est caminando.  - Contine tomando 81 mg de cido acetilsaliclico (AAS) todos los dias  - Adjunto documentos con informacin sobre dieta y ejercicio.  Esperamos verle pronto. Si tiene alguna pregunta o inquietud, llame a nuestra clnica al [phone]. El mejor horario para llamar es de lunes a viernes de 9:00 a 16:00, pero hay personal disponible las 24 horas. Si necesita renovar su receta de medicamentos, por favor avise a su farmacia con una semana de anticipacin y ellos nos enviarn la solicitud.  Gracias por confiar en m para su cuidado. Le deseo lo mejor!  Yahira Timberman, DO [facility] ______________________________________________  Today we discussed the following medical conditions and plan:   - check blood sugar in AM before eating and once 1-2 hours after dinner. Write these down and bring them to next appointment. blood sugars in morning without eating >150, After eating >250. Document everything, but these would be an important number.  - we will go down on dosing of amlodipine  to 2.5 mg daily (down from 5 mg daily); If you have worsening chest pain, go back up to 5 mg.  - try wearing compression socks  - put your feet up when you're not walking around - continue taking ASA 81 mg daily  - I have attached documents to help with diet/exercise for diabetes.   We look  forward to seeing you next time. Please call our clinic at [phone] if you have any questions or concerns. The best time to call is Monday-Friday from 9am-4pm, but there is someone available 24/7. If you need medication refills, please notify your pharmacy one week in advance and they will send us  a request.   Thank you for trusting me with your care. Wishing you the best!   Sallyanne Primas, DO  [facility]

## 2024-06-17 NOTE — Telephone Encounter (Signed)
 He was referred for an eye exam in 2023, 2024 and 2025. He Did not keep any appointments per/ with [facility] office
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
## 2024-06-18 ENCOUNTER — Other Ambulatory Visit: Payer: Self-pay | Admitting: Nurse Practitioner

## 2024-06-22 NOTE — Progress Notes (Signed)
 Internal Medicine Clinic Attending  I was physically present during the key portions of the resident provided service and participated in the medical decision making of patient's management care. I reviewed pertinent patient test results.  The assessment, diagnosis, and plan were formulated together and I agree with the documentation in the resident's note.  Francesco Elsie NOVAK, MD

## 2024-06-30 ENCOUNTER — Telehealth: Payer: Self-pay | Admitting: *Deleted

## 2024-06-30 ENCOUNTER — Other Ambulatory Visit: Payer: Self-pay

## 2024-06-30 DIAGNOSIS — R3989 Other symptoms and signs involving the genitourinary system: Secondary | ICD-10-CM

## 2024-06-30 DIAGNOSIS — I10 Essential (primary) hypertension: Secondary | ICD-10-CM

## 2024-06-30 NOTE — Telephone Encounter (Unsigned)
 Copied from CRM [id number]. Topic: Clinical - Medication Question >> Jun 30, 2024  9:31 AM Miquel SAILOR wrote: Reason for CRM: amLODipine  (NORVASC ) 2.5 MG tablet [[id number]]-[id number] Daughter Wells checking on dosage but checking again it was correct at 2.5 mg. No further questions >> Jun 30, 2024  9:40 AM Miquel SAILOR wrote: tamsulosin  (FLOMAX ) 0.4 MG CAPS capsule [[id number]]-[id number] urine darker since taking medication/Going to restroom in middle of night/PT is drinking a lot of water. Needs call back was just seen and forgot to mention 10/29  [phone]

## 2024-06-30 NOTE — Telephone Encounter (Signed)
 Spoke with patient's daughter 06/30/2024 around 1020, Patient was not present for phone call, but had given prior approval during last clinic appointment to discuss matters with daughter.    Patient relayed concern to daughter about dark discoloration of urine. patient's daughter stated that she believes that he is drinking adequate amount of water.  Patient is correlating darker urine to tamsulosin , however daughter states that patient have been taking this for years and this is the first that she has heard of the discoloration. Patient's daughter denies that the patient is experiencing any pain with urination or difficulty producing urine. Spoke with patient's daughter about having patient come in for urinalysis and depending on the results can come in for follow-up appointment.    Toshiro Hanken, DO

## 2024-06-30 NOTE — Telephone Encounter (Unsigned)
 Copied from CRM [id number]. Topic: Clinical - Medication Refill >> Jun 30, 2024  9:34 AM Miquel SAILOR wrote: Medication: lisinopril  (ZESTRIL ) 20 MG tablet   Has the patient contacted their pharmacy? Yes (Agent: If no, request that the patient contact the pharmacy for the refill. If patient does not wish to contact the pharmacy document the reason why and proceed with request.) (Agent: If yes, when and what did the pharmacy advise?)  This is the patient's preferred pharmacy:  [facility] [phone] - [place], [place] - [address] AT [facility] [address] [place] [address] Phone: [phone] Fax: [phone]   Phone: [phone] Fax: [phone]  Is this the correct pharmacy for this prescription? Yes If no, delete pharmacy and type the correct one.   Has the prescription been filled recently? Yes  Is the patient out of the medication? Yes  Has the patient been seen for an appointment in the last year OR does the patient have an upcoming appointment? Yes  Can we respond through MyChart? Yes  Agent: Please be advised that Rx refills may take up to 3 business days. We ask that you follow-up with your pharmacy.

## 2024-07-29 ENCOUNTER — Ambulatory Visit: Admitting: Dietician

## 2024-08-05 ENCOUNTER — Ambulatory Visit: Admitting: Dietician

## 2024-08-05 DIAGNOSIS — R3989 Other symptoms and signs involving the genitourinary system: Secondary | ICD-10-CM | POA: Diagnosis not present

## 2024-08-05 DIAGNOSIS — R82998 Other abnormal findings in urine: Secondary | ICD-10-CM

## 2024-08-05 NOTE — Patient Instructions (Signed)
 Please make an appointment with a dentist  Please make an appointment with Dr. Octavia [phone]  Please eat protein with meals three times a day   Increase vegetables by having them with lunch and dinner.  Please follow up in 6 months with me.  Please bring meter to all visits.   [name] [phone]

## 2024-08-05 NOTE — Progress Notes (Signed)
 Medical Nutrition Therapy:  Appt start time: 1300 end time:  1400. Total time: 60 minutes Visit # 1  Assessment:  Primary concerns today: meal planning for diabetes.  Calls himself a lone dog when asked if he prefers to exercise with others. He is here with his granddaughter and an interpreter. Granddaughter and her brother take him grocery shopping.  However, he lives with her mother and father. Leg swelling still present, right> left. He states hands also bother him and is worried about brown spots on his body. I encouraged him to show these and talk to his doctor about his concerns. He states strong family history of cancer. Note vitamin D  low in 2023. Need to assess supplementation.  Preferred Learning Style: Auditory;;Visual; Hands on Learning Readiness: Contemplating/Ready  ANTHROPOMETRICS: Estimated body mass index is 27.41 kg/m as calculated from the following:   Height as of 06/17/24: 5' 6 (1.676 m).   Weight as of this encounter: 169 lb 12.8 oz (77 kg).  WEIGHT HISTORY:  Wt Readings from Last 20 Encounters:  08/05/24 169 lb 12.8 oz (77 kg)  06/17/24 170 lb (77.1 kg)  12/20/23 167 lb 3.2 oz (75.8 kg)  10/23/23 166 lb 9.6 oz (75.6 kg)  10/21/23 167 lb (75.8 kg)  10/08/23 168 lb 14.4 oz (76.6 kg)  09/05/23 168 lb 6.4 oz (76.4 kg)  09/02/23 171 lb 8 oz (77.8 kg)  06/19/23 171 lb (77.6 kg)  03/22/23 171 lb 6.4 oz (77.7 kg)  01/16/23 168 lb 3.2 oz (76.3 kg)  10/03/22 171 lb 6.4 oz (77.7 kg)  08/30/22 172 lb (78 kg)  08/14/22 173 lb (78.5 kg)  07/23/22 171 lb 3.2 oz (77.7 kg)  07/10/22 163 lb 2.3 oz (74 kg)  07/10/22 165 lb 3.2 oz (74.9 kg)  05/30/22 172 lb (78 kg)  06/27/21 160 lb 6.4 oz (72.8 kg)  04/06/21 158 lb 9.6 oz (71.9 kg)    SLEEP:need to assess at future visit Labs:  Lipid Panel     Component Value Date/Time   CHOL 96 (L) 10/08/2023 0938   TRIG 36 10/08/2023 0938   HDL 45 10/08/2023 0938   CHOLHDL 2.1 10/08/2023 0938   CHOLHDL 4.7 05/05/2017 0736    VLDL 18 05/05/2017 0736   LDLCALC 40 10/08/2023 0938   LABVLDL 11 10/08/2023 0938   BP Readings from Last 3 Encounters:  06/17/24 113/62  12/20/23 (!) 102/58  12/10/23 122/60     MEDICATIONS: Outpatient Encounter Medications as of 08/05/2024  Medication Sig   Accu-Chek Softclix Lancets lancets Check blood sugar once per day   albuterol  (PROVENTIL ) (2.5 MG/3ML) 0.083% nebulizer solution Take 3 mLs (2.5 mg total) by nebulization every 6 (six) hours as needed for wheezing or shortness of breath.   albuterol  (VENTOLIN  HFA) 108 (90 Base) MCG/ACT inhaler TAKE 2 PUFFS BY MOUTH EVERY 6 HOURS AS NEEDED FOR WHEEZE OR SHORTNESS OF BREATH   amLODipine  (NORVASC ) 2.5 MG tablet Take 1 tablet (2.5 mg total) by mouth daily.   aspirin  EC 81 MG tablet Take 1 tablet (81 mg total) by mouth daily. Swallow whole.   atorvastatin  (LIPITOR ) 80 MG tablet Take 1 tablet (80 mg total) by mouth daily.   Blood Glucose Monitoring Suppl (ACCU-CHEK GUIDE ME) w/Device KIT Check blood sugar 1 time per day.   carvedilol  (COREG ) 6.25 MG tablet Take 1 tablet (6.25 mg total) by mouth 2 (two) times daily with a meal.   fluticasone  (FLONASE ) 50 MCG/ACT nasal spray SPRAY 1 SPRAY INTO EACH NOSTRIL  EVERY DAY   Fluticasone -Umeclidin-Vilant (TRELEGY ELLIPTA ) 200-62.5-25 MCG/ACT AEPB Inhale 1 puff into the lungs daily.   glucose blood (ACCU-CHEK GUIDE TEST) test strip Check blood sugar once per day   lisinopril  (ZESTRIL ) 20 MG tablet Take 1 tablet (20 mg total) by mouth daily.   loperamide  (IMODIUM  A-D) 2 MG tablet Take 1 tablet (2 mg total) by mouth 4 (four) times daily as needed for diarrhea or loose stools.   metFORMIN  (GLUCOPHAGE -XR) 500 MG 24 hr tablet Take 1 tablet (500 mg total) by mouth 2 (two) times daily with a meal. Restart on 07/15/22.   nitroGLYCERIN  (NITROSTAT ) 0.4 MG SL tablet Place 1 tablet (0.4 mg total) under the tongue every 5 (five) minutes x 3 doses as needed for chest pain.   omeprazole  (PRILOSEC) 20 MG capsule  Take 1 capsule (20 mg total) by mouth daily.   tamsulosin  (FLOMAX ) 0.4 MG CAPS capsule TOME 1 CAPSULA POR LA BOCA  DIARIO   No facility-administered encounter medications on file as of 08/05/2024.    BLOOD SUGAR:98 this am fasting, he did not bring his meter today but says he checks daily Lab Results  Component Value Date   HGBA1C 7.0 06/17/2024   HGBA1C 6.6 (A) 10/08/2023   HGBA1C 7.7 (A) 06/19/2023   HGBA1C 7.1 (H) 07/11/2022   HGBA1C 6.3 (A) 12/06/2021     DIETARY INTAKE: Usual eating pattern includes 2 meals and 0-1 snacks per day. Everyday foods include vegetables, breads, fish.  Avoided foods include lactose/milk, beef, pork.  Food Intolerances: avoids lactose/ milk Nausea: no,  Vomiting: no, Diarrhea: yes- 2-3 stools a day some formed others watery, Constipation: sometimes Dining Out (times/week): seldom 24-hr recall:  B ( AM): roll, black coffee   L ( PM): skips often D ( PM): salmon 3x/week, rice, raw green peppers, onions, tomato Beverages: water, black coffee  Usual physical activity: very little per granddaughter, he says since his fall and decreased vision    Progress Towards Goal(s):  In progress.   Nutritional Diagnosis:  NB-1.1 Food and nutrition-related knowledge deficit As related to lack of sufficient training in meal planning in past.  As evidenced by his omission of specific foods and food groups.    Intervention:  Nutrition education about meal  planning for diabetes concentrating on nutrient dense and balance for an older person. Encouraged walks daily for 15 minutes to start. Action Goal: eat protein and balanced meals 3x/day, walk.mover at least 15 minutes a day  Outcome goal:  improved nutrition knowledge  Coordination of care: none needed at this time  Teaching Method Utilized: Visual, Auditory,Hands on Handouts given during visit include: After visit summary Diabetes meal planning Barriers to learning/adherence to lifestyle change: competing  values Demonstrated degree of understanding via:  Teach Back   Monitoring/Evaluation:  Dietary intake, exercise, meter, and body weight in 6 month(s) Arland Hole, RD 08/05/2024 4:31 PM. .

## 2024-08-06 LAB — URINALYSIS, ROUTINE W REFLEX MICROSCOPIC
Bilirubin, UA: NEGATIVE
Glucose, UA: NEGATIVE
Ketones, UA: NEGATIVE
Leukocytes,UA: NEGATIVE
Nitrite, UA: NEGATIVE
Protein,UA: NEGATIVE
RBC, UA: NEGATIVE
Specific Gravity, UA: 1.017 (ref 1.005–1.030)
Urobilinogen, Ur: 1 mg/dL (ref 0.2–1.0)
pH, UA: 7 (ref 5.0–7.5)

## 2024-08-31 ENCOUNTER — Telehealth: Payer: Self-pay

## 2024-08-31 NOTE — Telephone Encounter (Addendum)
 COPD : RN Telephone Call   Patient ID: Kenyan Karnes, male    DOB: November 23, 1945  Age: 79 y.o. MRN: [id number]  Mr.Collan Schoenfeld is a 79 y.o. who was called about COPD management.   Patient reports that they are taking the following medications for COPD: yes   Patient reports they DO or DO NOT have a prior pulmonologist who they follow:  does have   Please document smoking status, how many packs? Ready to quit ?  - Smoking Hx: He quit smoking approximately 2 years ago.   mMRC (Modified Medical Research [employer]) Dyspnea Scale   Ask the following questions and please check the box that describes the most.   Choose ONE>   []  Grade 0: I only get breathless with strenuous exercise   []  Grade 1: I get short of breath when hurry on level ground or walking up a slight hill   []  Grade 2: On level Ground, I walk slower than people of the same age because of breathlessness or have to stop for breath when walking my own pace   []  Grade 3: I Stop for breath after walking about 100 yards or after a few minutes on level ground   []  Grade 4: I am too breathless to leave the house, or I am breathless when dressing   ( NONE RELATE TO PATIENT )  CAT (COPD Assessment Test)   Please ask each of the following questions and select between 0-5 based on what describes the patient most accurately.  On a scale 0 to 5, do you never cough or cough all the time: 0 (I never cough)  1 0 (My chest does not feel tight at all)  1 2 0 (I am confident leaving my home despite my lung condition) 0 (I sleep soundly) On a scale 0 to 5, how would you describe your energy?: 2  Total: 6

## 2024-09-04 ENCOUNTER — Other Ambulatory Visit: Payer: Self-pay | Admitting: Cardiology

## 2024-09-04 DIAGNOSIS — I10 Essential (primary) hypertension: Secondary | ICD-10-CM

## 2024-09-04 DIAGNOSIS — I2510 Atherosclerotic heart disease of native coronary artery without angina pectoris: Secondary | ICD-10-CM

## 2024-09-16 ENCOUNTER — Encounter: Payer: Self-pay | Admitting: Student

## 2024-09-16 ENCOUNTER — Other Ambulatory Visit (HOSPITAL_COMMUNITY)
Admission: RE | Admit: 2024-09-16 | Discharge: 2024-09-16 | Disposition: A | Discharge status: Home / Self Care | Source: Ambulatory Visit | Attending: Family Medicine | Admitting: Family Medicine

## 2024-09-16 ENCOUNTER — Ambulatory Visit: Payer: Self-pay | Admitting: Student

## 2024-09-16 ENCOUNTER — Other Ambulatory Visit: Payer: Self-pay | Admitting: Student

## 2024-09-16 VITALS — BP 126/62 | HR 74 | Temp 97.5°F | Ht 66.0 in | Wt 167.6 lb

## 2024-09-16 DIAGNOSIS — E119 Type 2 diabetes mellitus without complications: Secondary | ICD-10-CM

## 2024-09-16 DIAGNOSIS — D229 Melanocytic nevi, unspecified: Secondary | ICD-10-CM | POA: Insufficient documentation

## 2024-09-16 DIAGNOSIS — I10 Essential (primary) hypertension: Secondary | ICD-10-CM | POA: Diagnosis not present

## 2024-09-16 DIAGNOSIS — J438 Other emphysema: Secondary | ICD-10-CM

## 2024-09-16 LAB — POCT GLYCOSYLATED HEMOGLOBIN (HGB A1C): HbA1c, POC (controlled diabetic range): 6.9 % (ref 0.0–7.0)

## 2024-09-16 LAB — GLUCOSE, CAPILLARY: Glucose-Capillary: 105 mg/dL — ABNORMAL HIGH (ref 70–99)

## 2024-09-16 MED ORDER — METFORMIN HCL ER 500 MG PO TB24: 500.0000 mg | ORAL_TABLET | Freq: Two times a day (BID) | ORAL | 3 refills | Status: AC

## 2024-09-16 NOTE — Assessment & Plan Note (Signed)
 Patient has a past medical history of hypertension.  Current medications include Amlodipine  2.5 mg daily, lisinopril  20 mg daily, carvedilol  6.25 mg twice daily.  Blood pressure today is 126/62.  At goal.  Patient is due for BMP.  Will obtain.  Plan: - Follow-up BMP - Continue Amlodipine  2.5 mg daily - Continue lisinopril  20 mg daily - Continue carvedilol  6.25 mg twice daily

## 2024-09-16 NOTE — Telephone Encounter (Signed)
 Medication sent to pharmacy

## 2024-09-16 NOTE — Assessment & Plan Note (Signed)
 Patient has a past medical history of COPD.  mMRC grade 0.  CAT score 6.  He is well-controlled.  He is currently on Trelegy.  He has stopped smoking.  No acute concerns.  He states he knows how to use inhalers and declines teaching at this point.  Plan: - Continue Trelegy 1 puff daily - Monitor clinically

## 2024-09-16 NOTE — Assessment & Plan Note (Signed)
 Patient has a past medical history of type 2 diabetes mellitus.  He is due for urine microalbumin creatinine ratio.  He states he does not want ophthalmology exam at this time.  He is currently on metformin  500 mg twice daily.  He has no concerns at this time.  A1c today is 6.9.  Well-controlled.  No acute concerns at this time.  Will obtain BMP to evaluate kidney function today.  Plan: - Continue metformin  500 mg daily - Follow-up BMP - Patient declined ophthalmology exam - Follow-up urine microalbumin creatinine ratio - Follow-up in 3

## 2024-09-16 NOTE — Patient Instructions (Addendum)
-  [name], Thank you for allowing me to take part in your care today.  Here are your instructions.  1. I am going to call you with the results of your biopsy  2. If you develop a lot of bleeding please let us  know   3. Please call us  if you need to be seen earlier, otherwise come back in 2 months for follow up with your PCP.   4. I will call you with the results of your kidney function   PLEASE BRING YOUR MEDICATIONS TO EVERY APPOINTMENT  Thank you, Dr. Tobie  If you have any other questions please contact the internal medicine clinic at [phone] If it is after hours, please call the [facility] at [phone] and then ask the person who picks up for the resident on call.     [name], gracias por permitirme participar en su atencin mdica hoy. Aqu tiene las instrucciones:  1. Le llamar con los resultados de su biopsia.  2. Si presenta sangrado abundante, por favor, avsenos.  3. Si necesita una cita antes, llmenos; de lo contrario, regrese [employer] para una cita de seguimiento con su mdico de cabecera.  4. Le llamar con los resultados de sus pruebas de funcin renal.  POR FAVOR, TRAIGA SUS MEDICAMENTOS A CADA CITA.  Gracias, Dra. Patel  Si tiene alguna otra pregunta, comunquese con la clnica de medicina interna al [phone]. Si es fuera del horario de [place], llame al [facility] al [phone] y pida hablar con el mdico residente de guardia.
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
## 2024-09-17 ENCOUNTER — Ambulatory Visit: Payer: Self-pay | Admitting: Student

## 2024-09-17 LAB — BASIC METABOLIC PANEL WITH GFR
BUN/Creatinine Ratio: 13 (ref 10–24)
BUN: 12 mg/dL (ref 8–27)
CO2: 23 mmol/L (ref 20–29)
Calcium: 8.9 mg/dL (ref 8.6–10.2)
Chloride: 98 mmol/L (ref 96–106)
Creatinine, Ser: 0.94 mg/dL (ref 0.76–1.27)
Glucose: 94 mg/dL (ref 70–99)
Potassium: 5.2 mmol/L (ref 3.5–5.2)
Sodium: 134 mmol/L (ref 134–144)
eGFR: 82 mL/min/{1.73_m2}

## 2024-09-17 LAB — SURGICAL PATHOLOGY

## 2024-09-17 LAB — MICROALBUMIN / CREATININE URINE RATIO
Creatinine, Urine: 113.7 mg/dL
Microalb/Creat Ratio: 6 mg/g{creat} (ref 0–29)
Microalbumin, Urine: 7.1 ug/mL

## 2024-09-17 NOTE — Progress Notes (Signed)
 Internal Medicine Clinic Attending  I was physically present during the key portions of the resident provided service and participated in the medical decision making of patient's management care. I reviewed pertinent patient test results.  The assessment, diagnosis, and plan were formulated together and I agree with the documentation in the resident's note.  Jeanelle Layman CROME, MD  Likely a SK.  Await path

## 2025-01-27 ENCOUNTER — Ambulatory Visit: Payer: Self-pay | Admitting: Dietician
